# Patient Record
Sex: Female | Born: 1950
Health system: Southern US, Community
[De-identification: ages and names within clinical notes are randomized; demographics above are authoritative.]

## PROBLEM LIST (undated history)

## (undated) DIAGNOSIS — F32A Depression, unspecified: Secondary | ICD-10-CM

## (undated) DIAGNOSIS — M1990 Unspecified osteoarthritis, unspecified site: Secondary | ICD-10-CM

## (undated) DIAGNOSIS — E785 Hyperlipidemia, unspecified: Secondary | ICD-10-CM

## (undated) DIAGNOSIS — I10 Essential (primary) hypertension: Secondary | ICD-10-CM

## (undated) DIAGNOSIS — E559 Vitamin D deficiency, unspecified: Secondary | ICD-10-CM

## (undated) DIAGNOSIS — H269 Unspecified cataract: Secondary | ICD-10-CM

## (undated) DIAGNOSIS — F419 Anxiety disorder, unspecified: Secondary | ICD-10-CM

## (undated) DIAGNOSIS — E039 Hypothyroidism, unspecified: Secondary | ICD-10-CM

## (undated) DIAGNOSIS — M8580 Other specified disorders of bone density and structure, unspecified site: Secondary | ICD-10-CM

## (undated) DIAGNOSIS — N390 Urinary tract infection, site not specified: Secondary | ICD-10-CM

## (undated) DIAGNOSIS — F329 Major depressive disorder, single episode, unspecified: Secondary | ICD-10-CM

## (undated) HISTORY — DX: Hypothyroidism, unspecified: E03.9

## (undated) HISTORY — DX: Vitamin D deficiency, unspecified: E55.9

## (undated) HISTORY — DX: Unspecified cataract: H26.9

## (undated) HISTORY — DX: Essential (primary) hypertension: I10

## (undated) HISTORY — DX: Depression, unspecified: F32.A

## (undated) HISTORY — PX: JOINT REPLACEMENT: SHX530

## (undated) HISTORY — DX: Major depressive disorder, single episode, unspecified: F32.9

## (undated) HISTORY — PX: OTHER SURGICAL HISTORY: SHX169

## (undated) HISTORY — PX: EYE SURGERY: SHX253

## (undated) HISTORY — DX: Hyperlipidemia, unspecified: E78.5

## (undated) HISTORY — DX: Anxiety disorder, unspecified: F41.9

## (undated) HISTORY — PX: COLONOSCOPY: SHX174

---

## 1996-08-02 HISTORY — PX: OTHER SURGICAL HISTORY: SHX169

## 1998-10-02 ENCOUNTER — Other Ambulatory Visit: Admission: RE | Admit: 1998-10-02 | Discharge: 1998-10-02 | Payer: Self-pay | Admitting: *Deleted

## 2000-03-21 ENCOUNTER — Other Ambulatory Visit: Admission: RE | Admit: 2000-03-21 | Discharge: 2000-03-21 | Payer: Self-pay | Admitting: *Deleted

## 2002-05-10 ENCOUNTER — Other Ambulatory Visit: Admission: RE | Admit: 2002-05-10 | Discharge: 2002-05-10 | Payer: Self-pay | Admitting: *Deleted

## 2003-05-14 ENCOUNTER — Other Ambulatory Visit: Admission: RE | Admit: 2003-05-14 | Discharge: 2003-05-14 | Payer: Self-pay | Admitting: *Deleted

## 2004-05-28 ENCOUNTER — Other Ambulatory Visit: Admission: RE | Admit: 2004-05-28 | Discharge: 2004-05-28 | Payer: Self-pay | Admitting: *Deleted

## 2006-06-06 ENCOUNTER — Other Ambulatory Visit: Admission: RE | Admit: 2006-06-06 | Discharge: 2006-06-06 | Payer: Self-pay | Admitting: Internal Medicine

## 2008-05-30 ENCOUNTER — Other Ambulatory Visit: Admission: RE | Admit: 2008-05-30 | Discharge: 2008-05-30 | Payer: Self-pay | Admitting: Internal Medicine

## 2010-03-05 ENCOUNTER — Other Ambulatory Visit: Admission: RE | Admit: 2010-03-05 | Discharge: 2010-03-05 | Payer: Self-pay | Admitting: Internal Medicine

## 2011-10-17 ENCOUNTER — Other Ambulatory Visit: Payer: Self-pay | Admitting: Orthopedic Surgery

## 2011-12-15 DIAGNOSIS — N390 Urinary tract infection, site not specified: Secondary | ICD-10-CM

## 2011-12-15 HISTORY — DX: Urinary tract infection, site not specified: N39.0

## 2011-12-30 ENCOUNTER — Encounter (HOSPITAL_COMMUNITY): Payer: Self-pay

## 2011-12-30 ENCOUNTER — Encounter (HOSPITAL_COMMUNITY)
Admission: RE | Admit: 2011-12-30 | Discharge: 2011-12-30 | Disposition: A | Discharge status: Home / Self Care | Payer: BC Managed Care – PPO | Source: Ambulatory Visit | Attending: Orthopedic Surgery | Admitting: Orthopedic Surgery

## 2011-12-30 HISTORY — DX: Other specified disorders of bone density and structure, unspecified site: M85.80

## 2011-12-30 HISTORY — DX: Urinary tract infection, site not specified: N39.0

## 2011-12-30 HISTORY — DX: Unspecified osteoarthritis, unspecified site: M19.90

## 2011-12-30 LAB — COMPREHENSIVE METABOLIC PANEL
Alkaline Phosphatase: 84 U/L (ref 39–117)
BUN: 16 mg/dL (ref 6–23)
CO2: 28 mEq/L (ref 19–32)
GFR calc Af Amer: >90 mL/min (ref >90)
GFR calc non Af Amer: 90 mL/min — ABNORMAL LOW (ref >90)
Glucose, Bld: 103 mg/dL — ABNORMAL HIGH (ref 70–99)
Potassium: 4.1 mEq/L (ref 3.5–5.1)
Total Bilirubin: 0.4 mg/dL (ref 0.3–1.2)
Total Protein: 7.4 g/dL (ref 6.0–8.3)

## 2011-12-30 LAB — URINE MICROSCOPIC-ADD ON

## 2011-12-30 LAB — PROTIME-INR: Prothrombin Time: 13.2 seconds (ref 11.6–15.2)

## 2011-12-30 LAB — CBC
HCT: 40.8 % (ref 36.0–46.0)
Hemoglobin: 13.6 g/dL (ref 12.0–15.0)
MCHC: 33.3 g/dL (ref 30.0–36.0)

## 2011-12-30 LAB — APTT: aPTT: 30 seconds (ref 24–37)

## 2011-12-30 LAB — URINALYSIS, ROUTINE W REFLEX MICROSCOPIC
Bilirubin Urine: NEGATIVE
Hgb urine dipstick: NEGATIVE
Ketones, ur: NEGATIVE mg/dL
Protein, ur: NEGATIVE mg/dL
Urobilinogen, UA: 0.2 mg/dL (ref 0.0–1.0)

## 2011-12-30 NOTE — Pre-Procedure Instructions (Addendum)
ekg dr Oneta Rack 11-25-2011 on chart Medical clearance note dr Madilyn Fireman on chart

## 2011-12-30 NOTE — Patient Instructions (Addendum)
20 DESTENI PISCOPO  12/30/2011   Your procedure is scheduled on:  01-03-2012  Report to [facility] at  1000 AM.  Call this number if you have problems the morning of surgery: [phone]   Remember:   Do not eat food or drink liquids:After Midnight.  .  Take these medicines the morning of surgery with A SIP OF WATER: levothroxine, cymbalta   Do not wear jewelry or make up.  Do not wear lotions, powders, or perfumes.Do not wear deodorant.    Do not bring valuables to the hospital.  Contacts, dentures or bridgework may not be worn into surgery.  Leave suitcase in the car. After surgery it may be brought to your room.  For patients admitted to the hospital, checkout time is 11:00 AM the day of discharge.     Special Instructions: CHG Shower Use Special Wash: 1/2 bottle night before surgery and 1/2 bottle morning of surgery.neck down avoid private area, no shaving for 2 days before showers   Please read over the following fact sheets that you were given: MRSA Information, blood fact sheet, incentive spirometer fact sheet  [facility] pre op nurse phone number [phone], call if needed

## 2012-01-02 ENCOUNTER — Other Ambulatory Visit: Payer: Self-pay | Admitting: Orthopedic Surgery

## 2012-01-02 NOTE — H&P (Signed)
Michelle Miranda  DOB: 10/20/1950 Married / Language: English / Race: White Female  Date of Admission:  01/03/2012  Chief Complaint:  Right Knee Pain  History of Present Illness The patient is a 61 year old female who comes in for a preoperative History and Physical. The patient is scheduled for a right total knee arthroplasty to be performed by Dr. Frank V. Aluisio, MD at [facility] on 01/03/2012. The patient is a 60 year old female who presents with knee complaints. The patient is seen today Seen by Dr Beane in the past and refered here.. The patient reports left knee and right knee symptoms including: pain which began 2 year(s) (ore more) ago. The patient describes the severity of the symptoms as moderate in severity. Past treatment for this problem has included intra-articular injection of corticosteroids and nonsteroidal anti-inflammatory drugs. and include knee pain, tenderness, swelling, difficulty bearing weight and difficulty ambulating. Michelle Miranda has been seen by Dr. Beane in the past for her bilateral knee pain. She states that the knees started hurting in 2008. She has had cortisone injections, viscosupplementation, and Celebrex without relief. She has pain only with weightbearing. No previous knee surgeries. No locking or catching. She states that at present both knees hurt equally. She has been doing PT which does improve her knee pain for a couple days. She presents today to discuss total knee replacement. She is at a stage now where the knees are bothering her at all times. Limiting what she can and can not do. She generally still can sleep well. She said that she would like to be more active but can not do so because of the knees. She has had injections of cortisone and visco supplements without much benefit. She is ready to proceed with knee replacement.    Problem List/Past Medical Osteoarthritis, Knee (715.96) Impaired Vision Pneumonia. 1979 Urinary Tract  Infection. History Menopause Measles Mumps  Allergies No Known Drug Allergies.   Family History Hypertension. mother Osteoarthritis. mother and father Cancer. mother and grandmother fathers side Chronic Obstructive Lung Disease. father   Social History Marital status. married Living situation. live with spouse Illicit drug use. no Tobacco use. never smoker Pain Contract. no Number of flights of stairs before winded. 2-3 Current work status. working part time Children. 2 Alcohol use. current drinker; drinks wine; 5-7 per week Exercise. Exercises weekly; does other Drug/Alcohol Rehab (Previously). no Drug/Alcohol Rehab (Currently). no Post-Surgical Plans. Planis to go home.   Medication History Levothyroxine Sodium (25MCG Tablet, Oral) Active. Evista (60MG Tablet, Oral) Active. Cymbalta (30MG Capsule DR Part, Oral) Active. Tylenol (325MG Tablet, Oral) Active. Multi-Vitamin ( Oral) Active. Fish Oil Burp-Less (1000MG Capsule, Oral) Active. Vitamin B Complex ( Oral) Active. Advil (200MG Tablet, Oral) Active.   Past Surgical History Cesarean Delivery. 2 times Laproscopic Ovarian Cyst Removal. Date: 09/1996.   Review of Systems General:Not Present- Chills, Fever, Night Sweats, Fatigue, Weight Gain, Weight Loss and Memory Loss. Skin:Not Present- Hives, Itching, Rash, Eczema and Lesions. HEENT:Not Present- Tinnitus, Headache, Double Vision, Visual Loss, Hearing Loss and Dentures. Respiratory:Not Present- Shortness of breath with exertion, Shortness of breath at rest, Allergies, Coughing up blood and Chronic Cough. Cardiovascular:Not Present- Chest Pain, Racing/skipping heartbeats, Difficulty Breathing Lying Down, Murmur, Swelling and Palpitations. Gastrointestinal:Not Present- Bloody Stool, Heartburn, Abdominal Pain, Vomiting, Nausea, Constipation, Diarrhea, Difficulty Swallowing, Jaundice and Loss of appetitie. Female Genitourinary:Not  Present- Blood in Urine, Urinary frequency, Weak urinary stream, Discharge, Flank Pain, Incontinence, Painful Urination, Urgency, Urinary Retention and Urinating at   Night. Musculoskeletal:Present- Joint Pain. Not Present- Muscle Weakness, Muscle Pain, Joint Swelling, Back Pain, Morning Stiffness and Spasms. Neurological:Not Present- Tremor, Dizziness, Blackout spells, Paralysis, Difficulty with balance and Weakness. Psychiatric:Not Present- Insomnia.   Vitals Weight: 175 lb Height: 61 in Body Surface Area: 1.85 m Body Mass Index: 33.07 kg/m Pulse: 68 (Regular) Resp.: 14 (Unlabored) BP: 152/88 (Sitting, Right Arm, Standard)    Physical Exam The physical exam findings are as follows:   General Mental Status - Alert, cooperative and good historian. General Appearance- pleasant. Not in acute distress. Orientation- Oriented X3. Build & Nutrition- Well nourished and Well developed.   Head and Neck Head- normocephalic, atraumatic . Neck Global Assessment- supple. no bruit auscultated on the right and no bruit auscultated on the left.   Eye Pupil- Bilateral- Regular and Round. Motion- Bilateral- EOMI.   Note: wears contacts or glasses  Chest and Lung Exam Auscultation: Breath sounds:- clear at anterior chest wall and - clear at posterior chest wall. Adventitious sounds:- No Adventitious sounds.   Cardiovascular Auscultation:Rhythm- Regular rate and rhythm. Heart Sounds- S1 WNL and S2 WNL. Murmurs & Other Heart Sounds:Auscultation of the heart reveals - No Murmurs.   Abdomen Palpation/Percussion:Tenderness- Abdomen is non-tender to palpation. Rigidity (guarding)- Abdomen is soft. Auscultation:Auscultation of the abdomen reveals - Bowel sounds normal.   Female Genitourinary Not done, not pertinent to present illness  Musculoskeletal On exam she is alert and oriented in no apparent distress. Both hips have normal range of  motion without discomfort. Her left knee shows no effusion. There is moderate crepitus on range of motion of the knee. Range about 5 to 125 with no instability. Right knee no effusion, range 5 to 125, marked crepitus on range of motion. Tender medial greater than lateral with no instability noted.  RADIOGRAPHS: Radiographs are reviewed, AP and lateral both knees, showing advanced arthritic change medial and patellofemoral compartments of both knees with bone on bone and varus deformities.  Assessment & Plan Osteoarthritis, Knee (715.96) Impression: Right Knee  Note: Patient is for a Right Total Knee Placement by Dr. Aluisio.  Plan is to go home after the surgery.  PCP - Dr. McKeown - Patient has been seen preoperatively by Dr. McKeown and felt to be stable for surgery.  Signed electronically by DREW L Eileen Kangas, PA-C  

## 2012-01-03 ENCOUNTER — Inpatient Hospital Stay (HOSPITAL_COMMUNITY)
Admission: RE | Admit: 2012-01-03 | Discharge: 2012-01-06 | DRG: 209 | Disposition: A | Discharge status: Home Health | Payer: BC Managed Care – PPO | Source: Ambulatory Visit | Attending: Orthopedic Surgery | Admitting: Orthopedic Surgery

## 2012-01-03 ENCOUNTER — Encounter (HOSPITAL_COMMUNITY)
Admission: RE | Disposition: A | Discharge status: Home Health | Payer: Self-pay | Source: Ambulatory Visit | Attending: Orthopedic Surgery

## 2012-01-03 ENCOUNTER — Ambulatory Visit (HOSPITAL_COMMUNITY): Payer: BC Managed Care – PPO | Admitting: Anesthesiology

## 2012-01-03 ENCOUNTER — Encounter (HOSPITAL_COMMUNITY): Payer: Self-pay | Admitting: *Deleted

## 2012-01-03 ENCOUNTER — Encounter (HOSPITAL_COMMUNITY): Payer: Self-pay | Admitting: Anesthesiology

## 2012-01-03 DIAGNOSIS — Z01812 Encounter for preprocedural laboratory examination: Secondary | ICD-10-CM

## 2012-01-03 DIAGNOSIS — Z96659 Presence of unspecified artificial knee joint: Secondary | ICD-10-CM

## 2012-01-03 DIAGNOSIS — M1710 Unilateral primary osteoarthritis, unspecified knee: Principal | ICD-10-CM | POA: Diagnosis present

## 2012-01-03 DIAGNOSIS — M179 Osteoarthritis of knee, unspecified: Secondary | ICD-10-CM | POA: Diagnosis present

## 2012-01-03 DIAGNOSIS — Z87440 Personal history of urinary (tract) infections: Secondary | ICD-10-CM

## 2012-01-03 HISTORY — PX: TOTAL KNEE ARTHROPLASTY: SHX125

## 2012-01-03 LAB — TYPE AND SCREEN
ABO/RH(D): O POS
Antibody Screen: NEGATIVE

## 2012-01-03 SURGERY — ARTHROPLASTY, KNEE, TOTAL
Anesthesia: Spinal | Site: Knee | Laterality: Right | Wound class: Clean

## 2012-01-03 MED ORDER — METHOCARBAMOL 100 MG/ML IJ SOLN
500.0000 mg | Freq: Four times a day (QID) | INTRAVENOUS | Status: DC | PRN
  Filled 2012-01-03: qty 5

## 2012-01-03 MED ORDER — MORPHINE SULFATE (PF) 1 MG/ML IV SOLN
INTRAVENOUS | Status: DC
  Administered 2012-01-03: 15:00:00 via INTRAVENOUS
  Administered 2012-01-03: 9 mg via INTRAVENOUS
  Administered 2012-01-04: 8 mg via INTRAVENOUS
  Administered 2012-01-04: 04:00:00 via INTRAVENOUS
  Administered 2012-01-04: 8 mg via INTRAVENOUS
  Administered 2012-01-04: 9 mg via INTRAVENOUS

## 2012-01-03 MED ORDER — OXYCODONE HCL 5 MG PO TABS
5.0000 mg | ORAL_TABLET | ORAL | Status: DC | PRN
  Administered 2012-01-03 – 2012-01-06 (×14): 10 mg via ORAL
  Filled 2012-01-03 (×15): qty 2

## 2012-01-03 MED ORDER — SODIUM CHLORIDE 0.9 % IR SOLN
Status: DC | PRN
  Administered 2012-01-03: 1000 mL

## 2012-01-03 MED ORDER — DIPHENHYDRAMINE HCL 12.5 MG/5ML PO ELIX
12.5000 mg | ORAL_SOLUTION | Freq: Four times a day (QID) | ORAL | Status: DC | PRN
  Filled 2012-01-03: qty 5

## 2012-01-03 MED ORDER — LACTATED RINGERS IV SOLN: INTRAVENOUS | Status: DC

## 2012-01-03 MED ORDER — METHOCARBAMOL 500 MG PO TABS
500.0000 mg | ORAL_TABLET | Freq: Four times a day (QID) | ORAL | Status: DC | PRN
  Administered 2012-01-03 – 2012-01-06 (×8): 500 mg via ORAL
  Filled 2012-01-03 (×9): qty 1

## 2012-01-03 MED ORDER — LACTATED RINGERS IV SOLN
INTRAVENOUS | Status: DC
  Administered 2012-01-03: 13:00:00 via INTRAVENOUS
  Administered 2012-01-03: 1000 mL via INTRAVENOUS
  Administered 2012-01-03: 13:00:00 via INTRAVENOUS

## 2012-01-03 MED ORDER — FLEET ENEMA 7-19 GM/118ML RE ENEM: 1.0000 | ENEMA | Freq: Once | RECTAL | Status: AC | PRN

## 2012-01-03 MED ORDER — NALOXONE HCL 0.4 MG/ML IJ SOLN: 0.4000 mg | INTRAMUSCULAR | Status: DC | PRN

## 2012-01-03 MED ORDER — DEXAMETHASONE SODIUM PHOSPHATE 10 MG/ML IJ SOLN
INTRAMUSCULAR | Status: DC | PRN
  Administered 2012-01-03: 10 mg via INTRAVENOUS

## 2012-01-03 MED ORDER — HYDROMORPHONE HCL PF 1 MG/ML IJ SOLN: 0.2500 mg | INTRAMUSCULAR | Status: DC | PRN

## 2012-01-03 MED ORDER — TRAMADOL HCL 50 MG PO TABS
50.0000 mg | ORAL_TABLET | Freq: Four times a day (QID) | ORAL | Status: DC | PRN
  Filled 2012-01-03: qty 2

## 2012-01-03 MED ORDER — BISACODYL 10 MG RE SUPP: 10.0000 mg | Freq: Every day | RECTAL | Status: DC | PRN

## 2012-01-03 MED ORDER — CEFAZOLIN SODIUM-DEXTROSE 2-3 GM-% IV SOLR
2.0000 g | INTRAVENOUS | Status: AC
  Administered 2012-01-03: 2 g via INTRAVENOUS

## 2012-01-03 MED ORDER — RIVAROXABAN 10 MG PO TABS
10.0000 mg | ORAL_TABLET | Freq: Every day | ORAL | Status: DC
  Administered 2012-01-04 – 2012-01-06 (×3): 10 mg via ORAL
  Filled 2012-01-03 (×4): qty 1

## 2012-01-03 MED ORDER — PROMETHAZINE HCL 25 MG/ML IJ SOLN: 6.2500 mg | INTRAMUSCULAR | Status: DC | PRN

## 2012-01-03 MED ORDER — SODIUM CHLORIDE 0.9 % IJ SOLN: 9.0000 mL | INTRAMUSCULAR | Status: DC | PRN

## 2012-01-03 MED ORDER — BUPIVACAINE IN DEXTROSE 0.75-8.25 % IT SOLN
INTRATHECAL | Status: DC | PRN
  Administered 2012-01-03: 1.6 mL via INTRATHECAL

## 2012-01-03 MED ORDER — CEFAZOLIN SODIUM 1-5 GM-% IV SOLN
1.0000 g | Freq: Four times a day (QID) | INTRAVENOUS | Status: AC
  Administered 2012-01-03 – 2012-01-04 (×3): 1 g via INTRAVENOUS
  Filled 2012-01-03 (×3): qty 50

## 2012-01-03 MED ORDER — BUPIVACAINE 0.25 % ON-Q PUMP SINGLE CATH 300ML
INJECTION | Status: DC | PRN
  Administered 2012-01-03: 300 mL

## 2012-01-03 MED ORDER — BUPIVACAINE 0.25 % ON-Q PUMP SINGLE CATH 300ML
INJECTION | Status: AC
  Filled 2012-01-03: qty 300

## 2012-01-03 MED ORDER — ONDANSETRON HCL 4 MG/2ML IJ SOLN
4.0000 mg | Freq: Four times a day (QID) | INTRAMUSCULAR | Status: DC | PRN
  Administered 2012-01-05: 4 mg via INTRAVENOUS
  Filled 2012-01-03: qty 2

## 2012-01-03 MED ORDER — SODIUM CHLORIDE 0.9 % IV SOLN: INTRAVENOUS | Status: DC

## 2012-01-03 MED ORDER — ONDANSETRON HCL 4 MG/2ML IJ SOLN: 4.0000 mg | Freq: Four times a day (QID) | INTRAMUSCULAR | Status: DC | PRN

## 2012-01-03 MED ORDER — SODIUM CHLORIDE 0.9 % IV SOLN
INTRAVENOUS | Status: DC
  Administered 2012-01-03: 75 mL/h via INTRAVENOUS

## 2012-01-03 MED ORDER — CEFAZOLIN SODIUM-DEXTROSE 2-3 GM-% IV SOLR
INTRAVENOUS | Status: AC
  Filled 2012-01-03: qty 50

## 2012-01-03 MED ORDER — ACETAMINOPHEN 325 MG PO TABS
650.0000 mg | ORAL_TABLET | Freq: Four times a day (QID) | ORAL | Status: DC | PRN
  Filled 2012-01-03: qty 2

## 2012-01-03 MED ORDER — DIPHENHYDRAMINE HCL 50 MG/ML IJ SOLN: 12.5000 mg | Freq: Four times a day (QID) | INTRAMUSCULAR | Status: DC | PRN

## 2012-01-03 MED ORDER — METOCLOPRAMIDE HCL 10 MG PO TABS: 5.0000 mg | ORAL_TABLET | Freq: Three times a day (TID) | ORAL | Status: DC | PRN

## 2012-01-03 MED ORDER — FENTANYL CITRATE 0.05 MG/ML IJ SOLN
INTRAMUSCULAR | Status: DC | PRN
  Administered 2012-01-03: 100 ug via INTRAVENOUS

## 2012-01-03 MED ORDER — ACETAMINOPHEN 10 MG/ML IV SOLN
1000.0000 mg | Freq: Four times a day (QID) | INTRAVENOUS | Status: AC
  Administered 2012-01-03 – 2012-01-04 (×4): 1000 mg via INTRAVENOUS
  Filled 2012-01-03 (×6): qty 100

## 2012-01-03 MED ORDER — ACETAMINOPHEN 650 MG RE SUPP: 650.0000 mg | Freq: Four times a day (QID) | RECTAL | Status: DC | PRN

## 2012-01-03 MED ORDER — PHENOL 1.4 % MT LIQD: 1.0000 | OROMUCOSAL | Status: DC | PRN

## 2012-01-03 MED ORDER — ACETAMINOPHEN 10 MG/ML IV SOLN
INTRAVENOUS | Status: DC | PRN
  Administered 2012-01-03: 1000 mg via INTRAVENOUS

## 2012-01-03 MED ORDER — DULOXETINE HCL 30 MG PO CPEP
30.0000 mg | ORAL_CAPSULE | Freq: Every day | ORAL | Status: DC
  Administered 2012-01-03 – 2012-01-06 (×4): 30 mg via ORAL
  Filled 2012-01-03 (×4): qty 1

## 2012-01-03 MED ORDER — ACETAMINOPHEN 10 MG/ML IV SOLN
INTRAVENOUS | Status: AC
  Filled 2012-01-03: qty 100

## 2012-01-03 MED ORDER — LEVOTHYROXINE SODIUM 25 MCG PO TABS
25.0000 ug | ORAL_TABLET | Freq: Every day | ORAL | Status: DC
  Administered 2012-01-04 – 2012-01-06 (×3): 25 ug via ORAL
  Filled 2012-01-03 (×4): qty 1

## 2012-01-03 MED ORDER — DIPHENHYDRAMINE HCL 12.5 MG/5ML PO ELIX: 12.5000 mg | ORAL_SOLUTION | ORAL | Status: DC | PRN

## 2012-01-03 MED ORDER — METOCLOPRAMIDE HCL 5 MG/ML IJ SOLN: 5.0000 mg | Freq: Three times a day (TID) | INTRAMUSCULAR | Status: DC | PRN

## 2012-01-03 MED ORDER — CHLORHEXIDINE GLUCONATE 4 % EX LIQD
60.0000 mL | Freq: Once | CUTANEOUS | Status: DC
  Filled 2012-01-03: qty 60

## 2012-01-03 MED ORDER — MEPERIDINE HCL 50 MG/ML IJ SOLN: 6.2500 mg | INTRAMUSCULAR | Status: DC | PRN

## 2012-01-03 MED ORDER — PROPOFOL 10 MG/ML IV EMUL
INTRAVENOUS | Status: DC | PRN
  Administered 2012-01-03: 160 ug/kg/min via INTRAVENOUS

## 2012-01-03 MED ORDER — DOCUSATE SODIUM 100 MG PO CAPS
100.0000 mg | ORAL_CAPSULE | Freq: Two times a day (BID) | ORAL | Status: DC
  Administered 2012-01-03 – 2012-01-06 (×6): 100 mg via ORAL

## 2012-01-03 MED ORDER — MENTHOL 3 MG MT LOZG: 1.0000 | LOZENGE | OROMUCOSAL | Status: DC | PRN

## 2012-01-03 MED ORDER — MIDAZOLAM HCL 5 MG/5ML IJ SOLN
INTRAMUSCULAR | Status: DC | PRN
  Administered 2012-01-03: 2 mg via INTRAVENOUS

## 2012-01-03 MED ORDER — POLYETHYLENE GLYCOL 3350 17 G PO PACK: 17.0000 g | PACK | Freq: Every day | ORAL | Status: DC | PRN

## 2012-01-03 MED ORDER — MORPHINE SULFATE (PF) 1 MG/ML IV SOLN
INTRAVENOUS | Status: AC
  Filled 2012-01-03: qty 25

## 2012-01-03 MED ORDER — BUPIVACAINE ON-Q PAIN PUMP (FOR ORDER SET NO CHG)
INJECTION | Status: DC
  Filled 2012-01-03: qty 1

## 2012-01-03 MED ORDER — ONDANSETRON HCL 4 MG PO TABS: 4.0000 mg | ORAL_TABLET | Freq: Four times a day (QID) | ORAL | Status: DC | PRN

## 2012-01-03 MED ORDER — ONDANSETRON HCL 4 MG/2ML IJ SOLN
INTRAMUSCULAR | Status: DC | PRN
  Administered 2012-01-03: 4 mg via INTRAVENOUS

## 2012-01-03 MED ORDER — TEMAZEPAM 15 MG PO CAPS: 15.0000 mg | ORAL_CAPSULE | Freq: Every evening | ORAL | Status: DC | PRN

## 2012-01-03 SURGICAL SUPPLY — 55 items
BAG SPEC THK2 15X12 ZIP CLS (MISCELLANEOUS) ×1
BAG ZIPLOCK 12X15 (MISCELLANEOUS) ×2 IMPLANT
BANDAGE ELASTIC 6 VELCRO ST LF (GAUZE/BANDAGES/DRESSINGS) ×2 IMPLANT
BANDAGE ESMARK 6X9 LF (GAUZE/BANDAGES/DRESSINGS) ×1 IMPLANT
BLADE SAG 18X100X1.27 (BLADE) ×2 IMPLANT
BLADE SAW SGTL 11.0X1.19X90.0M (BLADE) ×2 IMPLANT
BNDG CMPR 9X6 STRL LF SNTH (GAUZE/BANDAGES/DRESSINGS) ×1
BNDG ESMARK 6X9 LF (GAUZE/BANDAGES/DRESSINGS) ×2
BOWL SMART MIX CTS (DISPOSABLE) ×2 IMPLANT
CATH KIT ON-Q SILVERSOAK 5 (CATHETERS) ×1 IMPLANT
CATH KIT ON-Q SILVERSOAK 5IN (CATHETERS) ×2 IMPLANT
CEMENT HV SMART SET (Cement) ×4 IMPLANT
CLOTH BEACON ORANGE TIMEOUT ST (SAFETY) ×2 IMPLANT
CLSR STERI-STRIP ANTIMIC 1/2X4 (GAUZE/BANDAGES/DRESSINGS) ×1 IMPLANT
CUFF TOURN SGL QUICK 34 (TOURNIQUET CUFF) ×2
CUFF TRNQT CYL 34X4X40X1 (TOURNIQUET CUFF) ×1 IMPLANT
DRAPE EXTREMITY T 121X128X90 (DRAPE) ×2 IMPLANT
DRAPE POUCH INSTRU U-SHP 10X18 (DRAPES) ×2 IMPLANT
DRAPE U-SHAPE 47X51 STRL (DRAPES) ×2 IMPLANT
DRSG ADAPTIC 3X8 NADH LF (GAUZE/BANDAGES/DRESSINGS) ×2 IMPLANT
DRSG PAD ABDOMINAL 8X10 ST (GAUZE/BANDAGES/DRESSINGS) ×1 IMPLANT
DURAPREP 26ML APPLICATOR (WOUND CARE) ×2 IMPLANT
ELECT REM PT RETURN 9FT ADLT (ELECTROSURGICAL) ×2
ELECTRODE REM PT RTRN 9FT ADLT (ELECTROSURGICAL) ×1 IMPLANT
EVACUATOR 1/8 PVC DRAIN (DRAIN) ×2 IMPLANT
FACESHIELD LNG OPTICON STERILE (SAFETY) ×9 IMPLANT
GLOVE BIO SURGEON STRL SZ7.5 (GLOVE) ×2 IMPLANT
GLOVE BIO SURGEON STRL SZ8 (GLOVE) ×2 IMPLANT
GLOVE BIOGEL PI IND STRL 8 (GLOVE) ×2 IMPLANT
GLOVE BIOGEL PI INDICATOR 8 (GLOVE) ×2
GOWN STRL NON-REIN LRG LVL3 (GOWN DISPOSABLE) ×3 IMPLANT
GOWN STRL REIN XL XLG (GOWN DISPOSABLE) ×3 IMPLANT
HANDPIECE INTERPULSE COAX TIP (DISPOSABLE) ×2
IMMOBILIZER KNEE 20 (SOFTGOODS) ×2
IMMOBILIZER KNEE 20 THIGH 36 (SOFTGOODS) ×1 IMPLANT
KIT BASIN OR (CUSTOM PROCEDURE TRAY) ×2 IMPLANT
MANIFOLD NEPTUNE II (INSTRUMENTS) ×2 IMPLANT
NS IRRIG 1000ML POUR BTL (IV SOLUTION) ×2 IMPLANT
PACK TOTAL JOINT (CUSTOM PROCEDURE TRAY) ×2 IMPLANT
PAD ABD 7.5X8 STRL (GAUZE/BANDAGES/DRESSINGS) ×1 IMPLANT
PADDING CAST COTTON 6X4 STRL (CAST SUPPLIES) ×4 IMPLANT
POSITIONER SURGICAL ARM (MISCELLANEOUS) ×2 IMPLANT
SET HNDPC FAN SPRY TIP SCT (DISPOSABLE) ×1 IMPLANT
SPONGE GAUZE 4X4 12PLY (GAUZE/BANDAGES/DRESSINGS) ×2 IMPLANT
STRIP CLOSURE SKIN 1/2X4 (GAUZE/BANDAGES/DRESSINGS) ×4 IMPLANT
SUCTION FRAZIER 12FR DISP (SUCTIONS) ×2 IMPLANT
SUT MNCRL AB 4-0 PS2 18 (SUTURE) ×2 IMPLANT
SUT PDS AB 1 CT1 27 (SUTURE) ×6 IMPLANT
SUT VIC AB 2-0 CT1 27 (SUTURE) ×6
SUT VIC AB 2-0 CT1 TAPERPNT 27 (SUTURE) ×3 IMPLANT
SUT VLOC 180 0 24IN GS25 (SUTURE) ×2 IMPLANT
TOWEL OR 17X26 10 PK STRL BLUE (TOWEL DISPOSABLE) ×4 IMPLANT
TRAY FOLEY CATH 14FRSI W/METER (CATHETERS) ×2 IMPLANT
WATER STERILE IRR 1500ML POUR (IV SOLUTION) ×2 IMPLANT
WRAP KNEE MAXI GEL POST OP (GAUZE/BANDAGES/DRESSINGS) ×4 IMPLANT

## 2012-01-03 NOTE — Anesthesia Procedure Notes (Signed)
Spinal  Patient location during procedure: OR Staffing Anesthesiologist: Ival Basquez Performed by: anesthesiologist  Preanesthetic Checklist Completed: patient identified, site marked, surgical consent, pre-op evaluation, timeout performed, IV checked, risks and benefits discussed and monitors and equipment checked Spinal Block Patient position: sitting Prep: Betadine Patient monitoring: heart rate, continuous pulse ox and blood pressure Approach: right paramedian Location: L3-4 Injection technique: single-shot Needle Needle type: Spinocan  Needle gauge: 22 G Needle length: 9 cm Additional Notes Expiration date of kit checked and confirmed. Patient tolerated procedure well, without complications.     

## 2012-01-03 NOTE — Interval H&P Note (Signed)
History and Physical Interval Note:  01/03/2012 12:17 PM  Michelle Miranda  has presented today for surgery, with the diagnosis of osteoarthritis right knee  The various methods of treatment have been discussed with the patient and family. After consideration of risks, benefits and other options for treatment, the patient has consented to  Procedure(s) (LRB): TOTAL KNEE ARTHROPLASTY (Right) as a surgical intervention .  The patients' history has been reviewed, patient examined, no change in status, stable for surgery.  I have reviewed the patients' chart and labs.  Questions were answered to the patient's satisfaction.     Loanne Drilling

## 2012-01-03 NOTE — Transfer of Care (Signed)
Immediate Anesthesia Transfer of Care Note  Patient: Michelle Miranda  Procedure(s) Performed: Procedure(s) (LRB): TOTAL KNEE ARTHROPLASTY (Right)  Patient Location: PACU  Anesthesia Type: Spinal  Level of Consciousness: awake, alert , oriented and patient cooperative  Airway & Oxygen Therapy: Patient Spontanous Breathing and Patient connected to face mask oxygen  Post-op Assessment: Report given to PACU RN and Post -op Vital signs reviewed and stable  Post vital signs: Reviewed and stable  Complications: No apparent anesthesia complications

## 2012-01-03 NOTE — Anesthesia Postprocedure Evaluation (Signed)
  Anesthesia Post-op Note  Patient: Michelle Miranda  Procedure(s) Performed: Procedure(s) (LRB): TOTAL KNEE ARTHROPLASTY (Right)  Patient Location: PACU  Anesthesia Type: Spinal  Level of Consciousness: awake and alert   Airway and Oxygen Therapy: Patient Spontanous Breathing  Post-op Pain: mild  Post-op Assessment: Post-op Vital signs reviewed, Patient's Cardiovascular Status Stable, Respiratory Function Stable, Patent Airway and No signs of Nausea or vomiting  Post-op Vital Signs: stable  Complications: No apparent anesthesia complications

## 2012-01-03 NOTE — Op Note (Signed)
Pre-operative diagnosis- Osteoarthritis  Right knee(s)  Post-operative diagnosis- Osteoarthritis Right knee(s)  Procedure-  Right  Total Knee Arthroplasty  Surgeon- Michelle Rankin. Robson Trickey, MD  Assistant- Avel Peace, PA-C   Anesthesia-  Spinal EBL-* No blood loss amount entered *  Drains Hemovac  Tourniquet time-  Total Tourniquet Time Documented: Thigh (Right) - 31 minutes   Complications- None  Condition-PACU - hemodynamically stable.   Brief Clinical Note  Michelle Miranda is a 61 y.o. year old female with end stage OA of her right knee with progressively worsening pain and dysfunction. She has constant pain, with activity and at rest and significant functional deficits with difficulties even with ADLs. She has had extensive non-op management including analgesics, injections of cortisone and viscosupplements, and home exercise program, but remains in significant pain with significant dysfunction.Radiographs show bone on bone arthritis medial and patellofemoral with varus deformity and tibial subluxation. She presents now for right Total Knee Arthroplasty.    Procedure in detail---   The patient is brought into the operating room and positioned supine on the operating table. After successful administration of  Spinal,   a tourniquet is placed high on the  Right thigh(s) and the lower extremity is prepped and draped in the usual sterile fashion. Time out is performed by the operating team and then the  Right lower extremity is wrapped in Esmarch, knee flexed and the tourniquet inflated to 300 mmHg.       A midline incision is made with a ten blade through the subcutaneous tissue to the level of the extensor mechanism. A fresh blade is used to make a medial parapatellar arthrotomy. Soft tissue over the proximal medial tibia is subperiosteally elevated to the joint line with a knife and into the semimembranosus bursa with a Cobb elevator. Soft tissue over the proximal lateral tibia is elevated with  attention being paid to avoiding the patellar tendon on the tibial tubercle. The patella is everted, knee flexed 90 degrees and the ACL and PCL are removed. Findings are bone on bone medial and patellofemoral with large medial osteophytes.        The drill is used to create a starting hole in the distal femur and the canal is thoroughly irrigated with sterile saline to remove the fatty contents. The 5 degree Right  valgus alignment guide is placed into the femoral canal and the distal femoral cutting block is pinned to remove 10 mm off the distal femur. Resection is made with an oscillating saw.      The tibia is subluxed forward and the menisci are removed. The extramedullary alignment guide is placed referencing proximally at the medial aspect of the tibial tubercle and distally along the second metatarsal axis and tibial crest. The block is pinned to remove 2mm off the more deficient medial  side. Resection is made with an oscillating saw. Size 2.5is the most appropriate size for the tibia and the proximal tibia is prepared with the modular drill and keel punch for that size.      The femoral sizing guide is placed and size 2.5 is most appropriate. Rotation is marked off the epicondylar axis and confirmed by creating a rectangular flexion gap at 90 degrees. The size 2.5 cutting block is pinned in this rotation and the anterior, posterior and chamfer cuts are made with the oscillating saw. The intercondylar block is then placed and that cut is made.      Trial size 2.5 tibial component, trial size 2.5 posterior stabilized  femur and a 10  mm posterior stabilized rotating platform insert trial is placed. Full extension is achieved with excellent varus/valgus and anterior/posterior balance throughout full range of motion. The patella is everted and thickness measured to be 22  mm. Free hand resection is taken to 12 mm, a 35 template is placed, lug holes are drilled, trial patella is placed, and it tracks  normally. Osteophytes are removed off the posterior femur with the trial in place. All trials are removed and the cut bone surfaces prepared with pulsatile lavage. Cement is mixed and once ready for implantation, the size 2.5 tibial implant, size  2.5 posterior stabilized femoral component, and the size 35 patella are cemented in place and the patella is held with the clamp. The trial insert is placed and the knee held in full extension. All extruded cement is removed and once the cement is hard the permanent 10 mm posterior stabilized rotating platform insert is placed into the tibial tray.      The wound is copiously irrigated with saline solution and the extensor mechanism closed over a hemovac drain with #1 PDS suture. The tourniquet is released for a total tourniquet time of 31  minutes. Flexion against gravity is 140 degrees and the patella tracks normally. Subcutaneous tissue is closed with 2.0 vicryl and subcuticular with running 4.0 Monocryl. The catheter for the Marcaine pain pump is placed and the pump is initiated. The incision is cleaned and dried and steri-strips and a bulky sterile dressing are applied. The limb is placed into a knee immobilizer and the patient is awakened and transported to recovery in stable condition.      Please note that a surgical assistant was a medical necessity for this procedure in order to perform it in a safe and expeditious manner. Surgical assistant was necessary to retract the ligaments and vital neurovascular structures to prevent injury to them and also necessary for proper positioning of the limb to allow for anatomic placement of the prosthesis.   Michelle Rankin Jonathon Tan, MD    01/03/2012, 1:23 PM

## 2012-01-03 NOTE — Anesthesia Preprocedure Evaluation (Signed)
Anesthesia Evaluation  Patient identified by MRN, date of birth, ID band Patient awake    Reviewed: Allergy & Precautions, H&P , NPO status , Patient's Chart, lab work & pertinent test results  Airway Mallampati: II TM Distance: >3 FB Neck ROM: Full    Dental No notable dental hx.    Pulmonary neg pulmonary ROS,  breath sounds clear to auscultation  Pulmonary exam normal       Cardiovascular negative cardio ROS  Rhythm:Regular Rate:Normal     Neuro/Psych negative neurological ROS  negative psych ROS   GI/Hepatic negative GI ROS, Neg liver ROS,   Endo/Other  negative endocrine ROS  Renal/GU negative Renal ROS  negative genitourinary   Musculoskeletal negative musculoskeletal ROS (+)   Abdominal   Peds negative pediatric ROS (+)  Hematology negative hematology ROS (+)   Anesthesia Other Findings   Reproductive/Obstetrics negative OB ROS                           Anesthesia Physical Anesthesia Plan  ASA: II  Anesthesia Plan: Spinal   Post-op Pain Management:    Induction:   Airway Management Planned:   Additional Equipment:   Intra-op Plan:   Post-operative Plan:   Informed Consent: I have reviewed the patients History and Physical, chart, labs and discussed the procedure including the risks, benefits and alternatives for the proposed anesthesia with the patient or authorized representative who has indicated his/her understanding and acceptance.   Dental advisory given  Plan Discussed with: CRNA  Anesthesia Plan Comments:         Anesthesia Quick Evaluation

## 2012-01-03 NOTE — H&P (View-Only) (Signed)
Michelle Miranda  DOB: 1951-04-23 Married / Language: English / Race: White Female  Date of Admission:  01/03/2012  Chief Complaint:  Right Knee Pain  History of Present Illness The patient is a 61 year old female who comes in for a preoperative History and Physical. The patient is scheduled for a right total knee arthroplasty to be performed by Dr. Gus Rankin. Aluisio, MD at [facility], [facility], [facility] on 01/03/2012. The patient is a 61 year old female who presents with knee complaints. The patient is seen today Seen by Dr Shelle Iron in the past and refered here.. The patient reports left knee and right knee symptoms including: pain which began 2 year(s) (ore more) ago. The patient describes the severity of the symptoms as moderate in severity. Past treatment for this problem has included intra-articular injection of corticosteroids and nonsteroidal anti-inflammatory drugs. and include knee pain, tenderness, swelling, difficulty bearing weight and difficulty ambulating. Michelle Miranda has been seen by Dr. Shelle Iron in the past for her bilateral knee pain. She states that the knees started hurting in 2008. She has had cortisone injections, viscosupplementation, and Celebrex without relief. She has pain only with weightbearing. No previous knee surgeries. No locking or catching. She states that at present both knees hurt equally. She has been doing PT which does improve her knee pain for a couple days. She presents today to discuss total knee replacement. She is at a stage now where the knees are bothering her at all times. Limiting what she can and can not do. She generally still can sleep well. She said that she would like to be more active but can not do so because of the knees. She has had injections of cortisone and visco supplements without much benefit. She is ready to proceed with knee replacement.    Problem List/Past Medical Osteoarthritis, Knee (715.96) Impaired Vision Pneumonia. 1979 Urinary Tract  Infection. History Menopause Measles Mumps  Allergies No Known Drug Allergies.   Family History Hypertension. mother Osteoarthritis. mother and father Cancer. mother and grandmother fathers side Chronic Obstructive Lung Disease. father   Social History Marital status. married Living situation. live with spouse Illicit drug use. no Tobacco use. never smoker Pain Contract. no Number of flights of stairs before winded. 2-3 Current work status. working part time Children. 2 Alcohol use. current drinker; drinks wine; 5-7 per week Exercise. Exercises weekly; does other Drug/Alcohol Rehab (Previously). no Drug/Alcohol Rehab (Currently). no Post-Surgical Plans. Planis to go home.   Medication History Levothyroxine Sodium ( Tablet, Oral) Active. Evista (60MG  Tablet, Oral) Active. Cymbalta (30MG  Capsule DR Part, Oral) Active. Tylenol (325MG  Tablet, Oral) Active. Multi-Vitamin ( Oral) Active. Fish Oil Burp-Less (1000MG  Capsule, Oral) Active. Vitamin B Complex ( Oral) Active. Advil (200MG  Tablet, Oral) Active.   Past Surgical History Cesarean Delivery. 2 times Laproscopic Ovarian Cyst Removal. Date: 09/1996.   Review of Systems General:Not Present- Chills, Fever, Night Sweats, Fatigue, Weight Gain, Weight Loss and Memory Loss. Skin:Not Present- Hives, Itching, Rash, Eczema and Lesions. HEENT:Not Present- Tinnitus, Headache, Double Vision, Visual Loss, Hearing Loss and Dentures. Respiratory:Not Present- Shortness of breath with exertion, Shortness of breath at rest, Allergies, Coughing up blood and Chronic Cough. Cardiovascular:Not Present- Chest Pain, Racing/skipping heartbeats, Difficulty Breathing Lying Down, Murmur, Swelling and Palpitations. Gastrointestinal:Not Present- Bloody Stool, Heartburn, Abdominal Pain, Vomiting, Nausea, Constipation, Diarrhea, Difficulty Swallowing, Jaundice and Loss of appetitie. Female Genitourinary:Not  Present- Blood in Urine, Urinary frequency, Weak urinary stream, Discharge, Flank Pain, Incontinence, Painful Urination, Urgency, Urinary Retention and Urinating at  Night. Musculoskeletal:Present- Joint Pain. Not Present- Muscle Weakness, Muscle Pain, Joint Swelling, Back Pain, Morning Stiffness and Spasms. Neurological:Not Present- Tremor, Dizziness, Blackout spells, Paralysis, Difficulty with balance and Weakness. Psychiatric:Not Present- Insomnia.   Vitals Weight: 175 lb Height: 61 in Body Surface Area: 1.85 m Body Mass Index: 33.07 kg/m Pulse: 68 (Regular) Resp.: 14 (Unlabored) BP: 152/88 (Sitting, Right Arm, Standard)    Physical Exam The physical exam findings are as follows:   General Mental Status - Alert, cooperative and good historian. General Appearance- pleasant. Not in acute distress. Orientation- Oriented X3. Build & Nutrition- Well nourished and Well developed.   Head and Neck Head- normocephalic, atraumatic . Neck Global Assessment- supple. no bruit auscultated on the right and no bruit auscultated on the left.   Eye Pupil- Bilateral- Regular and Round. Motion- Bilateral- EOMI.   Note: wears contacts or glasses  Chest and Lung Exam Auscultation: Breath sounds:- clear at anterior chest wall and - clear at posterior chest wall. Adventitious sounds:- No Adventitious sounds.   Cardiovascular Auscultation:Rhythm- Regular rate and rhythm. Heart Sounds- S1 WNL and S2 WNL. Murmurs & Other Heart Sounds:Auscultation of the heart reveals - No Murmurs.   Abdomen Palpation/Percussion:Tenderness- Abdomen is non-tender to palpation. Rigidity (guarding)- Abdomen is soft. Auscultation:Auscultation of the abdomen reveals - Bowel sounds normal.   Female Genitourinary Not done, not pertinent to present illness  Musculoskeletal On exam she is alert and oriented in no apparent distress. Both hips have normal range of  motion without discomfort. Her left knee shows no effusion. There is moderate crepitus on range of motion of the knee. Range about 5 to 125 with no instability. Right knee no effusion, range 5 to 125, marked crepitus on range of motion. Tender medial greater than lateral with no instability noted.  RADIOGRAPHS: Radiographs are reviewed, AP and lateral both knees, showing advanced arthritic change medial and patellofemoral compartments of both knees with bone on bone and varus deformities.  Assessment & Plan Osteoarthritis, Knee (715.96) Impression: Right Knee  Note: Patient is for a Right Total Knee Placement by Dr. Lequita Halt.  Plan is to go home after the surgery.  PCP - Dr. Oneta Rack - Patient has been seen preoperatively by Dr. Oneta Rack and felt to be stable for surgery.  Signed electronically by Roberts Gaudy, PA-C

## 2012-01-04 LAB — CBC
Platelets: 259 10*3/uL (ref 150–400)
RBC: 3.65 MIL/uL — ABNORMAL LOW (ref 3.87–5.11)
RDW: 12.4 % (ref 11.5–15.5)
WBC: 12.4 10*3/uL — ABNORMAL HIGH (ref 4.0–10.5)

## 2012-01-04 LAB — BASIC METABOLIC PANEL
CO2: 25 mEq/L (ref 19–32)
Chloride: 103 mEq/L (ref 96–112)
GFR calc Af Amer: >90 mL/min (ref >90)
Potassium: 4.3 mEq/L (ref 3.5–5.1)
Sodium: 137 mEq/L (ref 135–145)

## 2012-01-04 MED ORDER — HYDROMORPHONE HCL PF 1 MG/ML IJ SOLN: 0.5000 mg | INTRAMUSCULAR | Status: DC | PRN

## 2012-01-04 NOTE — Progress Notes (Signed)
Subjective: 1 Day Post-Op Procedure(s) (LRB): TOTAL KNEE ARTHROPLASTY (Right) Patient reports pain as mild.   Patient seen in rounds with Dr. Lequita Halt. Patient is well, but has had some minor complaints of nausea/vomiting and some slight itching. DC Morphine. We will start therapy today.  Plan is to go Home after hospital stay.  Objective: Vital signs in last 24 hours: Temp:  [97.6 F (36.4 C)-99.1 F (37.3 C)] 97.9 F (36.6 C) (06/04 0515) Pulse Rate:  [61-86] 66  (06/04 0515) Resp:  [12-18] 12  (06/04 0515) BP: (93-138)/(57-86) 112/74 mmHg (06/04 0515) SpO2:  [97 %-100 %] 99 % (06/04 0515) Weight:  [79.379 kg (175 lb)] 79.379 kg (175 lb) (06/03 1530)  Intake/Output from previous day:  Intake/Output Summary (Last 24 hours) at 01/04/12 0759 Last data filed at 01/04/12 0516  Gross per 24 hour  Intake 5443.75 ml  Output   2905 ml  Net 2538.75 ml    Intake/Output this shift: UOP 900  Labs:  [facility] 01/04/12 0420  HGB 11.3*    Basename 01/04/12 0420  WBC 12.4*  RBC 3.65*  HCT 33.8*  PLT 259    Basename 01/04/12 0420  NA 137  K 4.3  CL 103  CO2 25  BUN 9  CREATININE 0.76  GLUCOSE 118*  CALCIUM 8.5   No results found for this basename: LABPT:2,INR:2 in the last 72 hours  EXAM General - Patient is Alert, Appropriate and Oriented Extremity - Neurovascular intact Sensation intact distally Dorsiflexion/Plantar flexion intact Dressing - dressing C/D/I Motor Function - intact, moving foot and toes well on exam.  Hemovac pulled without difficulty.  Past Medical History  Diagnosis Date  . Osteopenia   . Arthritis   . Urinary tract infection 12-15-2011    Assessment/Plan: 1 Day Post-Op Procedure(s) (LRB): TOTAL KNEE ARTHROPLASTY (Right) Principal Problem:  *OA (osteoarthritis) of knee   Advance diet Up with therapy Continue foley due to strict I&O and urinary output monitoring Discharge home with home health  DVT Prophylaxis -  Xarelto Weight-Bearing as tolerated to right leg Keep foley until tomorrow. No vaccines. D/C PCA Morphine, Change IV push to Dilaudid. D/C O2 and Pulse OX and try on Room Air  Matheu Ploeger 01/04/2012, 7:59 AM

## 2012-01-04 NOTE — Evaluation (Signed)
Occupational Therapy Evaluation Patient Details Name: Michelle Miranda MRN: [id number] DOB: Dec 16, 1950 Today's Date: 01/04/2012 Time: 4098-1191 OT Time Calculation (min): 24 min  OT Assessment / Plan / Recommendation Clinical Impression  This 61 year old female was admitted for R TKA.  She currently needs overall min A for ADLs and will benefit from continued OT in acute to educate on bathroom transfers and AE.    OT Assessment  Patient needs continued OT Services    Follow Up Recommendations  No OT follow up    Barriers to Discharge      Equipment Recommendations  3 in 1 bedside comode;None recommended by PT    Recommendations for Other Services    Frequency  Min 2X/week    Precautions / Restrictions Precautions Precautions: Knee Restrictions Weight Bearing Restrictions: No Other Position/Activity Restrictions: WBAT   Pertinent Vitals/Pain     ADL  Eating/Feeding: Simulated;Independent Where Assessed - Eating/Feeding: Chair Grooming: Simulated;Set up Where Assessed - Grooming: Unsupported sitting Upper Body Bathing: Simulated;Set up Where Assessed - Upper Body Bathing: Unsupported sitting Lower Body Bathing: Simulated;Minimal assistance Where Assessed - Lower Body Bathing: Supported sit to stand Upper Body Dressing: Simulated;Minimal assistance (iv) Where Assessed - Upper Body Dressing: Unsupported sitting Lower Body Dressing: Simulated;Moderate assistance Where Assessed - Lower Body Dressing: Sopported sit to stand Transfers/Ambulation Related to ADLs: wanted to wait for bathroom transfers until this pm or tomorrow ADL Comments: Educated on AE.  Pt practiced with sock aid.      OT Diagnosis: Generalized weakness  OT Problem List: Decreased strength;Decreased activity tolerance;Decreased knowledge of use of DME or AE;Pain OT Treatment Interventions: Self-care/ADL training;DME and/or AE instruction;Patient/family education   OT Goals Acute Rehab OT Goals OT Goal  Formulation: With patient/family Time For Goal Achievement: 01/11/12 Potential to Achieve Goals: Good ADL Goals Pt Will Transfer to Toilet: with supervision;Ambulation;3-in-1 ADL Goal: Toilet Transfer - Progress: Goal set today Pt Will Perform Tub/Shower Transfer: Shower transfer;with supervision;Ambulation (3:1 in stall) ADL Goal: Tub/Shower Transfer - Progress: Goal set today Miscellaneous OT Goals Miscellaneous OT Goal #1: Pt will complete LB ADLs with supervision using AE OT Goal: Miscellaneous Goal #1 - Progress: Goal set today  Visit Information  Last OT Received On: 01/04/12 Assistance Needed: +1    Subjective Data  Subjective: "I'll have someone with me for the first week" Patient Stated Goal: none stated   Prior Functioning  Home Living Lives With: Spouse Available Help at Discharge: Family Type of Home: House Home Access: Stairs to enter Secretary/administrator of Steps: 15 Entrance Stairs-Rails: Right;Left Home Layout: One level Bathroom Shower/Tub: Health visitor: Standard Home Adaptive Equipment: Environmental consultant - rolling Additional Comments: Michelle Miranda is ordering a 3:1 Prior Function Level of Independence: Independent Able to Take Stairs?: Yes Driving: Yes Communication Communication: No difficulties Dominant Hand: Right    Cognition  Overall Cognitive Status: Appears within functional limits for tasks assessed/performed Arousal/Alertness: Awake/alert Orientation Level: Appears intact for tasks assessed Behavior During Session: [facility] for tasks performed    Extremity/Trunk Assessment Right Upper Extremity Assessment RUE ROM/Strength/Tone: Within functional levels Left Upper Extremity Assessment LUE ROM/Strength/Tone: Within functional levels   Mobility  Transfers Sit to Stand: 4: Min assist   Exercise   Balance    End of Session OT - End of Session Activity Tolerance: Patient tolerated treatment well Patient left: in chair;with call  bell/phone within reach;with family/visitor present CPM Right Knee CPM Right Knee: Off   Michelle Miranda 01/04/2012, 11:57 AM Michelle Miranda, OTR/L [phone] 01/04/2012

## 2012-01-04 NOTE — Evaluation (Signed)
Physical Therapy Evaluation Patient Details Name: Michelle Miranda MRN: [id number] DOB: 06/06/1951 Today's Date: 01/04/2012 Time: 4098-1191 PT Time Calculation (min): 38 min  PT Assessment / Plan / Recommendation Clinical Impression  Pt with R TKR presents with decreased R LE strength/ROM and limited functional mobility    PT Assessment  Patient needs continued PT services    Follow Up Recommendations  Home health PT    Barriers to Discharge        lEquipment Recommendations  None recommended by PT    Recommendations for Other Services OT consult   Frequency 7X/week    Precautions / Restrictions Precautions Precautions: Knee Restrictions Weight Bearing Restrictions: No Other Position/Activity Restrictions: WBAT   Pertinent Vitals/Pain 5/10; premedicated, cold pack applied      Mobility  Bed Mobility Bed Mobility: Supine to Sit Supine to Sit: 4: Min assist Details for Bed Mobility Assistance: cues for sequence and min assist with R LE Transfers Transfers: Sit to Stand;Stand to Sit Sit to Stand: 4: Min assist Stand to Sit: 4: Min assist Details for Transfer Assistance: cues for LE management and use of UEs to self assist Ambulation/Gait Ambulation/Gait Assistance: 4: Min assist Ambulation Distance (Feet): 44 Feet Assistive device: Rolling walker Ambulation/Gait Assistance Details: cues for sequence, position from RW and posture Gait Pattern: Step-to pattern    Exercises Total Joint Exercises Ankle Circles/Pumps: Both;10 reps;Supine;AROM Quad Sets: AROM;10 reps;Both;Supine Heel Slides: AAROM;10 reps;Supine;Right Straight Leg Raises: AROM;AAROM;15 reps;Right;Supine   PT Diagnosis: Difficulty walking  PT Problem List: Decreased strength;Decreased range of motion;Decreased activity tolerance;Decreased mobility;Pain;Decreased knowledge of use of DME PT Treatment Interventions: DME instruction;Gait training;Stair training;Functional mobility training;Therapeutic  activities;Therapeutic exercise;Patient/family education   PT Goals Acute Rehab PT Goals PT Goal Formulation: With patient Time For Goal Achievement: 01/10/12 Potential to Achieve Goals: Good Pt will go Supine/Side to Sit: with supervision PT Goal: Supine/Side to Sit - Progress: Goal set today Pt will go Sit to Supine/Side: with supervision PT Goal: Sit to Supine/Side - Progress: Goal set today Pt will go Sit to Stand: with supervision PT Goal: Sit to Stand - Progress: Goal set today Pt will go Stand to Sit: with supervision PT Goal: Stand to Sit - Progress: Goal set today Pt will Ambulate: 51 - 150 feet;with supervision;with rolling walker PT Goal: Ambulate - Progress: Goal set today Pt will Go Up / Down Stairs: Flight;with min assist;with least restrictive assistive device PT Goal: Up/Down Stairs - Progress: Goal set today  Visit Information  Last PT Received On: 01/04/12 Assistance Needed: +1    Subjective Data  Subjective: I'm going to have to come back for the other one soon Patient Stated Goal: Walk more gracefully   Prior Functioning  Home Living Lives With: Spouse Available Help at Discharge: Family Type of Home: House Home Access: Stairs to enter Secretary/administrator of Steps: 15 Entrance Stairs-Rails: Right;Left Home Layout: One level Home Adaptive Equipment: Walker - rolling Prior Function Level of Independence: Independent Able to Take Stairs?: Yes Driving: Yes Communication Communication: No difficulties Dominant Hand: Right    Cognition  Overall Cognitive Status: Appears within functional limits for tasks assessed/performed Arousal/Alertness: Awake/alert Orientation Level: Appears intact for tasks assessed Behavior During Session: [facility] for tasks performed    Extremity/Trunk Assessment Right Upper Extremity Assessment RUE ROM/Strength/Tone: Within functional levels Left Upper Extremity Assessment LUE ROM/Strength/Tone: Within functional  levels Right Lower Extremity Assessment RLE ROM/Strength/Tone: Deficits RLE ROM/Strength/Tone Deficits: AAROM at knee -10 - 35 with 3/5 quad strength Left Lower Extremity  Assessment LLE ROM/Strength/Tone: WFL for tasks assessed   Balance    End of Session PT - End of Session Activity Tolerance: Patient tolerated treatment well Patient left: in chair;with call bell/phone within reach Nurse Communication: Mobility status CPM Right Knee CPM Right Knee: Off   Katurah Karapetian 01/04/2012, 10:34 AM

## 2012-01-04 NOTE — Plan of Care (Signed)
Problem: Consults Goal: Diagnosis- Total Joint Replacement Outcome: Completed/Met Date Met:  01/04/12 Primary Total Knee RIGHT

## 2012-01-04 NOTE — Progress Notes (Signed)
Physical Therapy Treatment Patient Details Name: Michelle Miranda MRN: [id number] DOB: Oct 08, 1950 Today's Date: 01/04/2012 Time: 1478-2956 PT Time Calculation (min): 23 min  PT Assessment / Plan / Recommendation Comments on Treatment Session       Follow Up Recommendations  Home health PT    Barriers to Discharge        Equipment Recommendations  3 in 1 bedside comode;None recommended by PT    Recommendations for Other Services OT consult  Frequency 7X/week   Plan Discharge plan remains appropriate    Precautions / Restrictions Precautions Precautions: Knee Restrictions Weight Bearing Restrictions: No Other Position/Activity Restrictions: WBAT   Pertinent Vitals/Pain     Mobility  Bed Mobility Bed Mobility: Sit to Supine Sit to Supine: 4: Min assist Details for Bed Mobility Assistance: cues for sequence and min assist with R LE Transfers Transfers: Sit to Stand;Stand to Sit Sit to Stand: 4: Min assist Stand to Sit: 4: Min assist Details for Transfer Assistance: cues for LE management and use of UEs to self assist Ambulation/Gait Ambulation/Gait Assistance: 4: Min assist Ambulation Distance (Feet): 67 Feet Assistive device: Rolling walker Ambulation/Gait Assistance Details: cues for posture, sequence and position from RW Gait Pattern: Step-to pattern    Exercises     PT Diagnosis:    PT Problem List:   PT Treatment Interventions:     PT Goals Acute Rehab PT Goals PT Goal Formulation: With patient Time For Goal Achievement: 01/10/12 Potential to Achieve Goals: Good Pt will go Supine/Side to Sit: with supervision PT Goal: Supine/Side to Sit - Progress: Goal set today Pt will go Sit to Supine/Side: with supervision PT Goal: Sit to Supine/Side - Progress: Goal set today Pt will go Sit to Stand: with supervision PT Goal: Sit to Stand - Progress: Progressing toward goal Pt will go Stand to Sit: with supervision PT Goal: Stand to Sit - Progress: Goal set today Pt  will Ambulate: 51 - 150 feet;with supervision;with rolling walker PT Goal: Ambulate - Progress: Progressing toward goal Pt will Go Up / Down Stairs: Flight;with min assist;with least restrictive assistive device PT Goal: Up/Down Stairs - Progress: Goal set today  Visit Information  Last PT Received On: 01/04/12 Assistance Needed: +1    Subjective Data  Subjective: I'm ready Patient Stated Goal: Walk more gracefully   Cognition  Overall Cognitive Status: Appears within functional limits for tasks assessed/performed Arousal/Alertness: Awake/alert Orientation Level: Appears intact for tasks assessed Behavior During Session: [facility] for tasks performed    Balance     End of Session PT - End of Session Activity Tolerance: Patient tolerated treatment well Patient left: in bed;with call bell/phone within reach;with family/visitor present Nurse Communication: Mobility status    Michelle Miranda 01/04/2012, 3:37 PM

## 2012-01-05 ENCOUNTER — Encounter (HOSPITAL_COMMUNITY): Payer: Self-pay | Admitting: Orthopedic Surgery

## 2012-01-05 LAB — CBC
Hemoglobin: 9.9 g/dL — ABNORMAL LOW (ref 12.0–15.0)
MCH: 31.5 pg (ref 26.0–34.0)
RBC: 3.14 MIL/uL — ABNORMAL LOW (ref 3.87–5.11)
WBC: 12.3 10*3/uL — ABNORMAL HIGH (ref 4.0–10.5)

## 2012-01-05 LAB — BASIC METABOLIC PANEL
CO2: 28 mEq/L (ref 19–32)
Calcium: 8.2 mg/dL — ABNORMAL LOW (ref 8.4–10.5)
Chloride: 100 mEq/L (ref 96–112)
Glucose, Bld: 119 mg/dL — ABNORMAL HIGH (ref 70–99)
Sodium: 135 mEq/L (ref 135–145)

## 2012-01-05 NOTE — Progress Notes (Signed)
Physical Therapy Treatment Patient Details Name: Michelle Miranda MRN: [id number] DOB: October 07, 1950 Today's Date: 01/05/2012 Time: 7829-5621 PT Time Calculation (min): 35 min  PT Assessment / Plan / Recommendation Comments on Treatment Session  Husband present for session    Follow Up Recommendations  Home health PT    Barriers to Discharge        Equipment Recommendations  3 in 1 bedside comode    Recommendations for Other Services OT consult  Frequency 7X/week   Plan Discharge plan remains appropriate    Precautions / Restrictions Precautions Precautions: Knee;Fall Restrictions Other Position/Activity Restrictions: WBAT   Pertinent Vitals/Pain 6/10, premedicated    Mobility  Bed Mobility Bed Mobility: Supine to Sit;Sit to Supine Supine to Sit: 4: Min guard Sit to Supine: 4: Min assist Details for Bed Mobility Assistance: cues for sequence and to self assist L LE with UEs Transfers Transfers: Sit to Stand;Stand to Sit Sit to Stand: 4: Min guard;From bed;From chair/3-in-1;With upper extremity assist Stand to Sit: 4: Min guard;To bed;To chair/3-in-1 Details for Transfer Assistance: cues for LE management and use of UEs to self assist Ambulation/Gait Ambulation/Gait Assistance: 4: Min guard Ambulation Distance (Feet): 50 Feet Assistive device: Rolling walker Ambulation/Gait Assistance Details: min cues for position from RW and sequence Gait Pattern: Step-to pattern Stairs: Yes Stairs Assistance: 4: Min assist Stair Management Technique: One rail Right;With crutches;Forwards;Step to pattern Number of Stairs: 4     Exercises     PT Diagnosis:    PT Problem List:   PT Treatment Interventions:     PT Goals Acute Rehab PT Goals PT Goal Formulation: With patient Time For Goal Achievement: 01/10/12 Potential to Achieve Goals: Good Pt will go Supine/Side to Sit: with supervision PT Goal: Supine/Side to Sit - Progress: Progressing toward goal Pt will go Sit to  Supine/Side: with supervision PT Goal: Sit to Supine/Side - Progress: Progressing toward goal Pt will go Sit to Stand: with supervision PT Goal: Sit to Stand - Progress: Progressing toward goal Pt will go Stand to Sit: with supervision PT Goal: Stand to Sit - Progress: Progressing toward goal Pt will Ambulate: 51 - 150 feet;with supervision;with rolling walker PT Goal: Ambulate - Progress: Progressing toward goal Pt will Go Up / Down Stairs: Flight;with min assist;with least restrictive assistive device PT Goal: Up/Down Stairs - Progress: Progressing toward goal  Visit Information  Last PT Received On: 01/05/12 Assistance Needed: +1    Subjective Data  Subjective:  don't think I'm ready to tackla a full flight of stairs to get into my house Patient Stated Goal: Walk more gracefully   Cognition  Overall Cognitive Status: Appears within functional limits for tasks assessed/performed Arousal/Alertness: Awake/alert Orientation Level: Appears intact for tasks assessed Behavior During Session: [facility] for tasks performed    Balance     End of Session PT - End of Session Equipment Utilized During Treatment: Gait belt;Left knee immobilizer Activity Tolerance: Patient tolerated treatment well Patient left: in bed;with call bell/phone within reach;with family/visitor present Nurse Communication: Mobility status    Jonesha Tsuchiya 01/05/2012, 3:51 PM

## 2012-01-05 NOTE — Progress Notes (Signed)
Utilization review completed.  

## 2012-01-05 NOTE — Progress Notes (Signed)
Subjective: 2 Days Post-Op Procedure(s) (LRB): TOTAL KNEE ARTHROPLASTY (Right) Patient reports pain as mild.   Patient seen in rounds with Dr. Lequita Halt. Patient is well, and has had no acute complaints or problems Plan is to go Home after hospital stay.  Objective: Vital signs in last 24 hours: Temp:  [98 F (36.7 C)-100.1 F (37.8 C)] 100.1 F (37.8 C) (06/05 0529) Pulse Rate:  [75-89] 89  (06/05 0529) Resp:  [16] 16  (06/05 0529) BP: (93-100)/(60-63) 93/60 mmHg (06/05 0529) SpO2:  [92 %-98 %] 98 % (06/05 0529)  Intake/Output from previous day:  Intake/Output Summary (Last 24 hours) at 01/05/12 1311 Last data filed at 01/05/12 1228  Gross per 24 hour  Intake    860 ml  Output   1775 ml  Net   -915 ml    Intake/Output this shift: Total I/O In: -  Out: 1050 [Urine:1050]  Labs:  [facility] 01/05/12 0420 01/04/12 0420  HGB 9.9* 11.3*    Basename 01/05/12 0420 01/04/12 0420  WBC 12.3* 12.4*  RBC 3.14* 3.65*  HCT 29.3* 33.8*  PLT 244 259    Basename 01/05/12 0420 01/04/12 0420  NA 135 137  K 3.7 4.3  CL 100 103  CO2 28 25  BUN 11 9  CREATININE 0.81 0.76  GLUCOSE 119* 118*  CALCIUM 8.2* 8.5   No results found for this basename: LABPT:2,INR:2 in the last 72 hours  EXAM General - Patient is Alert, Appropriate and Oriented Extremity - Neurovascular intact Sensation intact distally Dorsiflexion/Plantar flexion intact Dressing/Incision - clean, dry, no drainage, healing Motor Function - intact, moving foot and toes well on exam.   Past Medical History  Diagnosis Date  . Osteopenia   . Arthritis   . Urinary tract infection 12-15-2011    Assessment/Plan: 2 Days Post-Op Procedure(s) (LRB): TOTAL KNEE ARTHROPLASTY (Right) Principal Problem:  *OA (osteoarthritis) of knee   Up with therapy Discharge home with home health Possible home later this evening versus tomorrow.  DVT Prophylaxis - Xarelto Weight-Bearing as tolerated to right leg  Tramell Piechota,  Jadarion Halbig 01/05/2012, 1:11 PM

## 2012-01-05 NOTE — Progress Notes (Signed)
Physical Therapy Treatment Patient Details Name: Michelle Miranda MRN: [id number] DOB: October 24, 1950 Today's Date: 01/05/2012 Time: 4098-1191 PT Time Calculation (min): 42 min  PT Assessment / Plan / Recommendation Comments on Treatment Session  Ltd by nausea - RN aware    Follow Up Recommendations  Home health PT    Barriers to Discharge        Equipment Recommendations  3 in 1 bedside comode;None recommended by PT    Recommendations for Other Services OT consult  Frequency 7X/week   Plan Discharge plan remains appropriate    Precautions / Restrictions Precautions Precautions: Knee Restrictions Weight Bearing Restrictions: No Other Position/Activity Restrictions: WBAT   Pertinent Vitals/Pain     Mobility  Bed Mobility Bed Mobility: Supine to Sit Supine to Sit: 4: Min guard Details for Bed Mobility Assistance: cues for sequence and to self assist L LE with UEs Transfers Transfers: Sit to Stand;Stand to Sit Sit to Stand: 4: Min assist;4: Min guard Stand to Sit: 4: Min guard;4: Min assist Details for Transfer Assistance: cues for LE management and use of UEs to self assist Ambulation/Gait Ambulation/Gait Assistance: 4: Min assist Ambulation Distance (Feet): 44 Feet Assistive device: Rolling walker Ambulation/Gait Assistance Details: cues for posture, stride length and position from RW Gait Pattern: Step-to pattern    Exercises Total Joint Exercises Ankle Circles/Pumps: Both;Supine;AROM;20 reps Quad Sets: AROM;Both;Supine;20 reps Heel Slides: AAROM;Supine;Right;20 reps Straight Leg Raises: AAROM;Right;Supine;20 reps   PT Diagnosis:    PT Problem List:   PT Treatment Interventions:     PT Goals Acute Rehab PT Goals PT Goal Formulation: With patient Time For Goal Achievement: 01/10/12 Potential to Achieve Goals: Good Pt will go Supine/Side to Sit: with supervision PT Goal: Supine/Side to Sit - Progress: Progressing toward goal Pt will go Sit to Supine/Side: with  supervision PT Goal: Sit to Supine/Side - Progress: Progressing toward goal Pt will go Sit to Stand: with supervision PT Goal: Sit to Stand - Progress: Progressing toward goal Pt will go Stand to Sit: with supervision PT Goal: Stand to Sit - Progress: Progressing toward goal Pt will Ambulate: 51 - 150 feet;with supervision;with rolling walker PT Goal: Ambulate - Progress: Progressing toward goal Pt will Go Up / Down Stairs: Flight;with min assist;with least restrictive assistive device  Visit Information  Last PT Received On: 01/05/12 Assistance Needed: +1    Subjective Data  Subjective: I might go home today but I am worried about all those stairs Patient Stated Goal: Walk more gracefully   Cognition  Overall Cognitive Status: Appears within functional limits for tasks assessed/performed Arousal/Alertness: Awake/alert Orientation Level: Appears intact for tasks assessed Behavior During Session: [facility] for tasks performed    Balance     End of Session PT - End of Session Equipment Utilized During Treatment: Gait belt;Left knee immobilizer Activity Tolerance: Other (comment) (ltd by onset of nausea) Patient left: in chair;with call bell/phone within reach Nurse Communication: Mobility status    Michelle Miranda 01/05/2012, 12:32 PM

## 2012-01-05 NOTE — Progress Notes (Signed)
Occupational Therapy Treatment Patient Details Name: Michelle Miranda MRN: [id number] DOB: [birth year] Today's Date: 01/05/2012 Time: 1191-4782 OT Time Calculation (min): 15 min  OT Assessment / Plan / Recommendation Comments on Treatment Session Pt stating she was discouraged that she was not progressing faster in mobility for d/c home today.      Follow Up Recommendations  No OT follow up    Barriers to Discharge       Equipment Recommendations  3 in 1 bedside comode    Recommendations for Other Services    Frequency Min 2X/week   Plan Discharge plan remains appropriate    Precautions / Restrictions Precautions Precautions: Knee;Fall Restrictions Other Position/Activity Restrictions: WBAT   Pertinent Vitals/Pain     ADL  Toilet Transfer: Performed;Minimal Dentist Method: Other (comment) (ambulating) Acupuncturist: Materials engineer and Hygiene: Performed;Minimal assistance Where Assessed - Engineer, mining and Hygiene: Standing Equipment Used: Rolling walker Transfers/Ambulation Related to ADLs: Pt stating she felt worse today than yesterday.  Verbally instructed in shower stall transfers and suggested pt get an inexpensive resin chair to [place] as a shower seat as her husband will not be available to put the 3 in 1 from the toilet to the shower when he returns to work out of town. ADL Comments: Pt voiced knowledge in use of AE.  Will have her husband purchase kit in gift shop.  Instructed pt in multiple uses of 3 in 1. Pt with anxiety about going home today as she said she is not feeling as well as she did yesterday, particularly since she has not practiced stairs.  Assured pt that it was not unusual to stay an extra day for therapy.    OT Diagnosis:    OT Problem List:   OT Treatment Interventions:     OT Goals ADL Goals Pt Will Transfer to Toilet: with supervision;Ambulation;3-in-1 ADL Goal: Toilet  Transfer - Progress: Progressing toward goals Pt Will Perform Tub/Shower Transfer: Shower transfer;with supervision;Ambulation ADL Goal: Tub/Shower Transfer - Progress: Progressing toward goals Miscellaneous OT Goals Miscellaneous OT Goal #1: Pt will complete LB ADLs with supervision using AE OT Goal: Miscellaneous Goal #1 - Progress: Progressing toward goals  Visit Information  Last OT Received On: 01/05/12 Assistance Needed: +1    Subjective Data      Prior Functioning       Cognition  Overall Cognitive Status: Appears within functional limits for tasks assessed/performed Arousal/Alertness: Awake/alert Orientation Level: Appears intact for tasks assessed Behavior During Session: [facility] for tasks performed    Mobility Bed Mobility Bed Mobility: Sit to Supine Sit to Supine: 4: Min assist Transfers Sit to Stand: 4: Min guard;From chair/3-in-1 Stand to Sit: 4: Min guard;To bed;To chair/3-in-1   Exercises    Balance    End of Session OT - End of Session Activity Tolerance: Patient tolerated treatment well Patient left: in bed;with call bell/phone within reach   Evern Bio 01/05/2012, 2:10 PM [phone]

## 2012-01-06 LAB — CBC
HCT: 28.3 % — ABNORMAL LOW (ref 36.0–46.0)
Hemoglobin: 9.4 g/dL — ABNORMAL LOW (ref 12.0–15.0)
MCH: 30.6 pg (ref 26.0–34.0)
MCV: 92.2 fL (ref 78.0–100.0)
RBC: 3.07 MIL/uL — ABNORMAL LOW (ref 3.87–5.11)
WBC: 11.1 10*3/uL — ABNORMAL HIGH (ref 4.0–10.5)

## 2012-01-06 MED ORDER — OXYCODONE HCL 5 MG PO TABS: 5.0000 mg | ORAL_TABLET | ORAL | Status: AC | PRN

## 2012-01-06 MED ORDER — RIVAROXABAN 10 MG PO TABS: 10.0000 mg | ORAL_TABLET | Freq: Every day | ORAL | Status: DC

## 2012-01-06 MED ORDER — POLYSACCHARIDE IRON COMPLEX 150 MG PO CAPS: 150.0000 mg | ORAL_CAPSULE | Freq: Every day | ORAL | Status: DC

## 2012-01-06 MED ORDER — POLYSACCHARIDE IRON COMPLEX 150 MG PO CAPS
150.0000 mg | ORAL_CAPSULE | Freq: Every day | ORAL | Status: DC
  Administered 2012-01-06: 150 mg via ORAL
  Filled 2012-01-06 (×2): qty 1

## 2012-01-06 MED ORDER — METHOCARBAMOL 500 MG PO TABS: 500.0000 mg | ORAL_TABLET | Freq: Four times a day (QID) | ORAL | Status: AC | PRN

## 2012-01-06 NOTE — Care Management Note (Signed)
    Page 1 of 2   01/06/2012     6:23:29 PM   CARE MANAGEMENT NOTE 01/06/2012  Patient:  Michelle Miranda, Michelle Miranda   Account Number:  [id number]  Date Initiated:  01/06/2012  Documentation initiated by:  Colleen Can  Subjective/Objective Assessment:   dx osteoarthritis right knee; total knee replacemnt     Action/Plan:   CM spoke with patient and spouse. Plans are for pt to return to her home in [place], where spouse will be caregiver. She already has rw and crutches. #n1 has already been delivered to Rm. Pt requesting [name] for [facility] services. Set up pta   Anticipated DC Date:  01/06/2012   Anticipated DC Plan:  HOME W HOME HEALTH SERVICES  In-house referral  NA      DC Planning Services  CM consult      [facility] Choice  HOME HEALTH  DURABLE MEDICAL EQUIPMENT   Choice offered to / List presented to:  C-1 Patient   DME arranged  NA      DME agency  NA     HH arranged  HH-2 PT      [facility] agency  [facility]   Status of service:  Completed, signed off Medicare Important Message given?  NO (If response is "NO", the following Medicare IM given date fields will be blank) Date Medicare IM given:   Date Additional Medicare IM given:    Discharge Disposition:  HOME W HOME HEALTH SERVICES  Per UR Regulation:    If discussed at Long Length of Stay Meetings, dates discussed:    Comments:  01/06/2012 Raynelle Bring BSN CCM [phone] List of [facility] agencies placed on shadow chart. [facility] arranged for DME. HH services will start tomorrow 01/06/2012

## 2012-01-06 NOTE — Progress Notes (Signed)
Discharge summary sent to payer through [employer]  

## 2012-01-06 NOTE — Progress Notes (Signed)
Physical Therapy Treatment Patient Details Name: Michelle Miranda MRN: [id number] DOB: 08/01/1951 Today's Date: 01/06/2012 Time: 4098-1191 PT Time Calculation (min): 40 min  PT Assessment / Plan / Recommendation Comments on Treatment Session       Follow Up Recommendations  Home health PT    Barriers to Discharge        Equipment Recommendations  3 in 1 bedside comode    Recommendations for Other Services OT consult  Frequency 7X/week   Plan Discharge plan remains appropriate    Precautions / Restrictions Precautions Precautions: Knee;Fall Required Braces or Orthoses: Knee Immobilizer - Right Restrictions Weight Bearing Restrictions: No Other Position/Activity Restrictions: WBAT   Pertinent Vitals/Pain 5/10; premedicated, cold pack provided    Mobility  Bed Mobility Supine to Sit: 4: Min guard Transfers Transfers: Sit to Stand;Stand to Sit Sit to Stand: 5: Supervision Stand to Sit: 5: Supervision Details for Transfer Assistance: cues for LE management and use of UEs to self assist Ambulation/Gait Ambulation/Gait Assistance: 4: Min guard;5: Supervision Ambulation Distance (Feet): 150 Feet Assistive device: Rolling walker Ambulation/Gait Assistance Details: min cues for posture and positiion from RW Gait Pattern: Step-to pattern Stairs: Yes Stairs Assistance: 4: Min assist Stairs Assistance Details (indicate cue type and reason): cues for sequence and crutch placement Stair Management Technique: One rail Right;With crutches;Forwards;Step to pattern Number of Stairs: 10     Exercises Total Joint Exercises Ankle Circles/Pumps: Both;Supine;AROM;20 reps Quad Sets: AROM;Both;Supine;20 reps Heel Slides: AAROM;Supine;Right;20 reps Straight Leg Raises: AAROM;Right;Supine;20 reps   PT Diagnosis:    PT Problem List:   PT Treatment Interventions:     PT Goals Acute Rehab PT Goals PT Goal Formulation: With patient Time For Goal Achievement: 01/10/12 Potential to Achieve  Goals: Good Pt will go Supine/Side to Sit: with supervision PT Goal: Supine/Side to Sit - Progress: Progressing toward goal Pt will go Sit to Supine/Side: with supervision PT Goal: Sit to Supine/Side - Progress: Progressing toward goal Pt will go Sit to Stand: with supervision PT Goal: Sit to Stand - Progress: Met Pt will go Stand to Sit: with supervision PT Goal: Stand to Sit - Progress: Met Pt will Ambulate: 51 - 150 feet;with supervision;with rolling walker PT Goal: Ambulate - Progress: Met Pt will Go Up / Down Stairs: Flight;with min assist;with least restrictive assistive device PT Goal: Up/Down Stairs - Progress: Met  Visit Information  Last PT Received On: 01/06/12 Assistance Needed: +1    Subjective Data  Subjective: I am feeling much better about going home today Patient Stated Goal: Walk more gracefully   Cognition  Overall Cognitive Status: Appears within functional limits for tasks assessed/performed Arousal/Alertness: Awake/alert Orientation Level: Appears intact for tasks assessed Behavior During Session: [facility] for tasks performed    Balance     End of Session PT - End of Session Equipment Utilized During Treatment: Gait belt;Left knee immobilizer Patient left: in chair;with call bell/phone within reach Nurse Communication: Mobility status    Michelle Miranda 01/06/2012, 12:22 PM

## 2012-01-06 NOTE — Discharge Instructions (Signed)
 Dr. Frank Aluisio Total Joint Specialist [facility] [address] [place], [place] [address] [phone]  TOTAL KNEE REPLACEMENT POSTOPERATIVE DIRECTIONS    Knee Rehabilitation, Guidelines Following Surgery  Results after knee surgery are often greatly improved when you follow the exercise, range of motion and muscle strengthening exercises prescribed by your doctor. Safety measures are also important to protect the knee from further injury. Any time any of these exercises cause you to have increased pain or swelling in your knee joint, decrease the amount until you are comfortable again and slowly increase them. If you have problems or questions, call your caregiver or physical therapist for advice.   HOME CARE INSTRUCTIONS  Remove items at home which could result in a fall. This includes throw rugs or furniture in walking pathways.  Continue medications as instructed at time of discharge. You may have some home medications which will be placed on hold until you complete the course of blood thinner medication.  You may start showering once you are discharged home but do not submerge the incision under water. Just pat the incision dry and apply a dry gauze dressing on daily. Walk with walker as instructed.   Use walker as long as suggested by your caregivers.  Avoid periods of inactivity such as sitting longer than an hour when not asleep. This helps prevent blood clots.  You may put full weight on your legs and walk as much as is comfortable.  You may resume a sexual relationship in one month or when given the OK by your doctor.  You may return to work once you are cleared by your doctor.  Do not drive a car for 6 weeks or until released by you surgeon.   Do not drive while taking narcotics.  Wear the elastic stockings for three weeks following surgery during the day but you may remove then at night. Make sure you keep all of your appointments after your  operation with all of your doctors and caregivers. You should call the office at the above phone number and make an appointment for approximately two weeks after the date of your surgery. Change the dressing daily and reapply a dry dressing each time. Please pick up a stool softener and laxative for home use as long as you are requiring pain medications.  Continue to use ice on the knee for pain and swelling from surgery. You may notice swelling that will progress down to the foot and ankle.  This is normal after surgery.  Elevate the leg when you are not up walking on it.   It is important for you to complete the blood thinner medication as prescribed by your doctor.  Continue to use the breathing machine which will help keep your temperature down.  It is common for your temperature to cycle up and down following surgery, especially at night when you are not up moving around and exerting yourself.  The breathing machine keeps your lungs expanded and your temperature down.  RANGE OF MOTION AND STRENGTHENING EXERCISES  Rehabilitation of the knee is important following a knee injury or an operation. After just a few days of immobilization, the muscles of the thigh which control the knee become weakened and shrink (atrophy). Knee exercises are designed to build up the tone and strength of the thigh muscles and to improve knee motion. Often times heat used for twenty to thirty minutes before working out will loosen up your tissues and help with improving the range   of motion but do not use heat for the first two weeks following surgery. These exercises can be done on a training (exercise) mat, on the floor, on a table or on a bed. Use what ever works the best and is most comfortable for you Knee exercises include:  Leg Lifts - While your knee is still immobilized in a splint or cast, you can do straight leg raises. Lift the leg to 60 degrees, hold for 3 sec, and slowly lower the leg. Repeat 10-20 times 2-3  times daily. Perform this exercise against resistance later as your knee gets better.  Quad and Hamstring Sets - Tighten up the muscle on the front of the thigh (Quad) and hold for 5-10 sec. Repeat this 10-20 times hourly. Hamstring sets are done by pushing the foot backward against an object and holding for 5-10 sec. Repeat as with quad sets.  A rehabilitation program following serious knee injuries can speed recovery and prevent re-injury in the future due to weakened muscles. Contact your doctor or a physical therapist for more information on knee rehabilitation.   SKILLED REHAB INSTRUCTIONS: If the patient is transferred to a skilled rehab facility following release from the hospital, a list of the current medications will be sent to the facility for the patient to continue.  When discharged from the skilled rehab facility, please have the facility set up the patient's Home Health Physical Therapy prior to being released. Also, the skilled facility will be responsible for providing the patient with their medications at time of release from the facility to include their pain medication, the muscle relaxants, and their blood thinner medication. If the patient is still at the rehab facility at time of the two week follow up appointment, the skilled rehab facility will also need to assist the patient in arranging follow up appointment in our office and any transportation needs.  MAKE SURE YOU:  Understand these instructions.  Will watch your condition.  Will get help right away if you are not doing well or get worse.  Document Released: 07/19/2005 Document Revised: 07/08/2011 Document Reviewed: 01/06/2007  ExitCare Patient Information 2012 [employer].   Take Xarelto for two and a half more weeks, then discontinue Xarelto.        

## 2012-01-06 NOTE — Progress Notes (Signed)
Occupational Therapy Treatment and Discharge Patient Details Name: Michelle Miranda MRN: [id number] DOB: 04-10-1951 Today's Date: 01/06/2012 Time: 4098-1191 OT Time Calculation (min): 19 min  OT Assessment / Plan / Recommendation Comments on Treatment Session      Follow Up Recommendations  No OT follow up    Barriers to Discharge       Equipment Recommendations  3 in 1 bedside comode    Recommendations for Other Services    Frequency     Plan      Precautions / Restrictions Precautions Precautions: Knee;Fall Restrictions Weight Bearing Restrictions: No Other Position/Activity Restrictions: WBAT       ADL  Toilet Transfer: Performed;Supervision/safety Toilet Transfer Method:  (ambulate) Acupuncturist: Environmental manager:  (reviewed and demonstrated sequence.  Pt did not feel she needed to practice)    OT Diagnosis:    OT Problem List:   OT Treatment Interventions:     OT Goals ADL Goals Pt Will Transfer to Toilet: with supervision;Ambulation;3-in-1 ADL Goal: Toilet Transfer - Progress: Met Pt Will Perform Tub/Shower Transfer: Shower transfer;with supervision;Ambulation ADL Goal: Tub/Shower Transfer - Progress:  (verbalizes) Miscellaneous OT Goals Miscellaneous OT Goal #1: Pt will complete LB ADLs with supervision using AE OT Goal: Miscellaneous Goal #1 - Progress: Other (comment) (verbalizes)  Visit Information  Last OT Received On: 01/06/12 Assistance Needed: +1    Subjective Data      Prior Functioning       Cognition  Overall Cognitive Status: Appears within functional limits for tasks assessed/performed Behavior During Session: [facility] for tasks performed    Mobility Bed Mobility Supine to Sit: 4: Min guard   Exercises    Balance    End of Session OT - End of Session Activity Tolerance: Patient tolerated treatment well Patient left: in bed;with call bell/phone within reach CPM Right Knee CPM Right Knee: Off    [name] 01/06/2012, 9:14 AM [name], OTR/L [phone] 01/06/2012

## 2012-01-06 NOTE — Progress Notes (Signed)
Subjective: 3 Days Post-Op Procedure(s) (LRB): TOTAL KNEE ARTHROPLASTY (Right) Patient reports pain as mild.   Patient seen in rounds by Dr. Lequita Halt. Patient is well, and has had no acute complaints or problems Patient is ready to go home.  Objective: Vital signs in last 24 hours: Temp:  [98.1 F (36.7 C)-99.1 F (37.3 C)] 99.1 F (37.3 C) (06/06 0531) Pulse Rate:  [72-90] 90  (06/06 0531) Resp:  [16] 16  (06/06 0531) BP: (102-115)/(65-83) 115/83 mmHg (06/06 0531) SpO2:  [96 %-100 %] 96 % (06/06 0531)  Intake/Output from previous day:  Intake/Output Summary (Last 24 hours) at 01/06/12 1009 Last data filed at 01/06/12 0532  Gross per 24 hour  Intake    600 ml  Output   2050 ml  Net  -1450 ml    Intake/Output this shift:    Labs:  Basename 01/06/12 0418 01/05/12 0420 01/04/12 0420  HGB 9.4* 9.9* 11.3*    Basename 01/06/12 0418 01/05/12 0420  WBC 11.1* 12.3*  RBC 3.07* 3.14*  HCT 28.3* 29.3*  PLT 223 244    Basename 01/05/12 0420 01/04/12 0420  NA 135 137  K 3.7 4.3  CL 100 103  CO2 28 25  BUN 11 9  CREATININE 0.81 0.76  GLUCOSE 119* 118*  CALCIUM 8.2* 8.5   No results found for this basename: LABPT:2,INR:2 in the last 72 hours  EXAM: General - Patient is Alert, Appropriate and Oriented Extremity - Neurovascular intact Sensation intact distally Dorsiflexion/Plantar flexion intact Incision - clean, dry, no drainage, healing Motor Function - intact, moving foot and toes well on exam.   Assessment/Plan: 3 Days Post-Op Procedure(s) (LRB): TOTAL KNEE ARTHROPLASTY (Right) Procedure(s) (LRB): TOTAL KNEE ARTHROPLASTY (Right) Past Medical History  Diagnosis Date  . Osteopenia   . Arthritis   . Urinary tract infection 12-15-2011   Principal Problem:  *OA (osteoarthritis) of knee   Discharge home with home health Diet - Regular diet Follow up - in 2 weeks Activity - WBAT Disposition - Home Condition Upon Discharge - Good D/C Meds - See DC  Summary DVT Prophylaxis - Xarelto  Michelle Miranda 01/06/2012, 10:09 AM

## 2012-01-06 NOTE — Progress Notes (Signed)
Patient OOB to reclining chair with husband at side. Discharge instructions and education given with them repeating information back. They state clear understanding, will be discharging home shortly.

## 2012-01-14 NOTE — Discharge Summary (Signed)
Physician Discharge Summary   Patient ID: Michelle Miranda MRN: [id number] DOB/AGE: Feb 24, 1951 61 y.o.  Admit date: 01/03/2012 Discharge date: 01/05/2030  Primary Diagnosis: Osteoarthritis Right Knee   Admission Diagnoses:  Past Medical History  Diagnosis Date  . Osteopenia   . Arthritis   . Urinary tract infection 12-15-2011   Discharge Diagnoses:   Principal Problem:  *OA (osteoarthritis) of knee  Procedure:  Procedure(s) (LRB): TOTAL KNEE ARTHROPLASTY (Right)   Consults: None  HPI: Michelle Miranda is a 61 y.o. year old female with end stage OA of her right knee with progressively worsening pain and dysfunction. She has constant pain, with activity and at rest and significant functional deficits with difficulties even with ADLs. She has had extensive non-op management including analgesics, injections of cortisone and viscosupplements, and home exercise program, but remains in significant pain with significant dysfunction.Radiographs show bone on bone arthritis medial and patellofemoral with varus deformity and tibial subluxation. She presents now for right Total Knee Arthroplasty.  Laboratory Data: Hospital Outpatient Visit on 12/30/2011  Component Date Value Range Status  . aPTT 12/30/2011 30  24 - 37 seconds Final  . WBC 12/30/2011 8.0  4.0 - 10.5 K/uL Final  . RBC 12/30/2011 4.42  3.87 - 5.11 MIL/uL Final  . Hemoglobin 12/30/2011 13.6  12.0 - 15.0 g/dL Final  . HCT 40/98/1191 40.8  36.0 - 46.0 % Final  . MCV 12/30/2011 92.3  78.0 - 100.0 fL Final  . MCH 12/30/2011 30.8  26.0 - 34.0 pg Final  . MCHC 12/30/2011 33.3  30.0 - 36.0 g/dL Final  . RDW 47/82/9562 12.5  11.5 - 15.5 % Final  . Platelets 12/30/2011 331  150 - 400 K/uL Final  . Sodium 12/30/2011 138  135 - 145 mEq/L Final  . Potassium 12/30/2011 4.1  3.5 - 5.1 mEq/L Final  . Chloride 12/30/2011 101  96 - 112 mEq/L Final  . CO2 12/30/2011 28  19 - 32 mEq/L Final  . Glucose, Bld 12/30/2011 103* 70 - 99 mg/dL Final  . BUN  13/03/6577 16  6 - 23 mg/dL Final  . Creatinine, Ser 12/30/2011 0.78  0.50 - 1.10 mg/dL Final  . Calcium 46/96/2952 9.1  8.4 - 10.5 mg/dL Final  . Total Protein 12/30/2011 7.4  6.0 - 8.3 g/dL Final  . Albumin 84/13/2440 3.9  3.5 - 5.2 g/dL Final  . AST 05/29/2535 23  0 - 37 U/L Final  . ALT 12/30/2011 22  0 - 35 U/L Final  . Alkaline Phosphatase 12/30/2011 84  39 - 117 U/L Final  . Total Bilirubin 12/30/2011 0.4  0.3 - 1.2 mg/dL Final  . GFR calc non Af Amer 12/30/2011 90* >90 mL/min Final  . GFR calc Af Amer 12/30/2011 >90  >90 mL/min Final   Comment:                                 The eGFR has been calculated                          using the CKD EPI equation.                          This calculation has not been  validated in all clinical                          situations.                          eGFR's persistently                          <90 mL/min signify                          possible Chronic Kidney Disease.  Marland Kitchen Prothrombin Time 12/30/2011 13.2  11.6 - 15.2 seconds Final  . INR 12/30/2011 0.98  0.00 - 1.49 Final  . Color, Urine 12/30/2011 YELLOW  YELLOW Final  . APPearance 12/30/2011 CLEAR  CLEAR Final  . Specific Gravity, Urine 12/30/2011 1.023  1.005 - 1.030 Final  . pH 12/30/2011 6.5  5.0 - 8.0 Final  . Glucose, UA 12/30/2011 NEGATIVE  NEGATIVE mg/dL Final  . Hgb urine dipstick 12/30/2011 NEGATIVE  NEGATIVE Final  . Bilirubin Urine 12/30/2011 NEGATIVE  NEGATIVE Final  . Ketones, ur 12/30/2011 NEGATIVE  NEGATIVE mg/dL Final  . Protein, ur 16/05/9603 NEGATIVE  NEGATIVE mg/dL Final  . Urobilinogen, UA 12/30/2011 0.2  0.0 - 1.0 mg/dL Final  . Nitrite 54/04/8118 NEGATIVE  NEGATIVE Final  . Leukocytes, UA 12/30/2011 SMALL* NEGATIVE Final  . MRSA, PCR 12/30/2011 NEGATIVE  NEGATIVE Final  . Staphylococcus aureus 12/30/2011 NEGATIVE  NEGATIVE Final   Comment:                                 The Xpert SA Assay (FDA                           approved for NASAL specimens                          only), is one component of                          a comprehensive surveillance                          program.  It is not intended                          to diagnose infection nor to                          guide or monitor treatment.  . Squamous Epithelial / LPF 12/30/2011 RARE  RARE Final  . WBC, UA 12/30/2011 3-6  <3 WBC/hpf Final  . RBC / HPF 12/30/2011 0-2  <3 RBC/hpf Final  . Bacteria, UA 12/30/2011 FEW* RARE Final  . Urine-Other 12/30/2011 MUCOUS PRESENT   Final   No results found for this basename: HGB:5 in the last 72 hours No results found for this basename: WBC:2,RBC:2,HCT:2,PLT:2 in the last 72 hours No results found for this basename: NA:2,K:2,CL:2,CO2:2,BUN:2,CREATININE:2,GLUCOSE:2,CALCIUM:2 in the last 72 hours No results found for this basename: LABPT:2,INR:2 in the last 72 hours  X-Rays:No results found.  EKG:No orders found for this or any previous  visit.   Hospital Course: Patient was admitted to [facility] and taken to the OR and underwent the above state procedure without complications.  Patient tolerated the procedure well and was later transferred to the recovery room and then to the orthopaedic floor for postoperative care.  They were given PO and IV analgesics for pain control following their surgery.  They were given 24 hours of postoperative antibiotics and started on DVT prophylaxis in the form of Xarelto.   PT and OT were ordered for total joint protocol.  Discharge planning consulted to help with postop disposition and equipment needs.  Patient had a toung night on the evening of surgery due to some minor complaints of nausea/vomiting and some slight itching but started to get up OOB with therapy on day one.  PCA Morphine was discontinued and they were weaned over to PO meds.  Hemovac drain was pulled without difficulty.  Continued to work with therapy into day two.  Dressing was changed on day  two and the incision was healing well.  By day three, the patient had progressed with therapy and meeting their goals.  Incision was healing well.  Patient was seen in rounds and was ready to go home.  Discharge Medications: Prior to Admission medications   Medication Sig Start Date End Date Taking? Authorizing Provider  DULoxetine (CYMBALTA) 30 MG capsule Take 30 mg by mouth daily with breakfast.    Historical Provider, MD  iron polysaccharides (NIFEREX) 150 MG capsule Take 1 capsule (150 mg total) by mouth daily. Take for two and a half more weeks, then discontinue. 01/06/12 01/05/13  Nyeshia Mysliwiec, PA  levothyroxine (SYNTHROID, LEVOTHROID) 25 MCG tablet Take 25 mcg by mouth every morning.    Historical Provider, MD  methocarbamol (ROBAXIN) 500 MG tablet Take 1 tablet (500 mg total) by mouth every 6 (six) hours as needed. 01/06/12 01/16/12  Kaoir Loree, PA  oxyCODONE (OXY IR/ROXICODONE) 5 MG immediate release tablet Take 1-2 tablets (5-10 mg total) by mouth every 4 (four) hours as needed for pain. 01/06/12 01/16/12  Emilene Roma Julien Girt, PA  rivaroxaban (XARELTO) 10 MG TABS tablet Take 1 tablet (10 mg total) by mouth daily with breakfast. Take Xarelto for two and a half more weeks, then discontinue Xarelto. 01/06/12   Jansen Sciuto Julien Girt, PA    Diet: Regular diet Activity:WBAT Follow-up:in 2 weeks Disposition - Home Discharged Condition: good   Discharge Orders    Future Orders Please Complete By Expires   Diet general      Call MD / Call 911      Comments:   If you experience chest pain or shortness of breath, CALL 911 and be transported to the hospital emergency room.  If you develope a fever above 101 F, pus (white drainage) or increased drainage or redness at the wound, or calf pain, call your surgeon's office.   Discharge instructions      Comments:   Pick up stool softner and laxative for home. Do not submerge incision under water. May shower. Continue to use ice for pain and  swelling from surgery.  Take Xarelto for two and a half more weeks, then discontinue Xarelto.   Constipation Prevention      Comments:   Drink plenty of fluids.  Prune juice may be helpful.  You may use a stool softener, such as Colace (over the counter) 100 mg twice a day.  Use MiraLax (over the counter) for constipation as needed.   Increase activity slowly as tolerated  Patient may shower      Comments:   You may shower without a dressing once there is no drainage.  Do not wash over the wound.  If drainage remains, do not shower until drainage stops.   Driving restrictions      Comments:   No driving until released by the physician.   Lifting restrictions      Comments:   No lifting until released by the physician.   TED hose      Comments:   Use stockings (TED hose) for 3 weeks on both leg(s).  You may remove them at night for sleeping.   Change dressing      Comments:   Change dressing daily with sterile 4 x 4 inch gauze dressing and apply TED hose. Do not submerge the incision under water.   Do not put a pillow under the knee. Place it under the heel.      Do not sit on low chairs, stoools or toilet seats, as it may be difficult to get up from low surfaces        Medication List  As of 01/14/2012  1:17 PM   STOP taking these medications         CALCIUM CITRATE + D3 250-200 MG-UNIT Tabs      cholecalciferol 1000 UNITS tablet      Fish Oil 1200 MG Caps      ibuprofen 200 MG tablet      multivitamin with minerals tablet      raloxifene 60 MG tablet      VITAMIN B COMPLEX PO         TAKE these medications         DULoxetine 30 MG capsule   Commonly known as: CYMBALTA   Take 30 mg by mouth daily with breakfast.      iron polysaccharides 150 MG capsule   Commonly known as: NIFEREX   Take 1 capsule (150 mg total) by mouth daily. Take for two and a half more weeks, then discontinue.      levothyroxine 25 MCG tablet   Commonly known as: SYNTHROID, LEVOTHROID    Take 25 mcg by mouth every morning.      methocarbamol 500 MG tablet   Commonly known as: ROBAXIN   Take 1 tablet (500 mg total) by mouth every 6 (six) hours as needed.      oxyCODONE 5 MG immediate release tablet   Commonly known as: Oxy IR/ROXICODONE   Take 1-2 tablets (5-10 mg total) by mouth every 4 (four) hours as needed for pain.      rivaroxaban 10 MG Tabs tablet   Commonly known as: XARELTO   Take 1 tablet (10 mg total) by mouth daily with breakfast. Take Xarelto for two and a half more weeks, then discontinue Xarelto.           Follow-up Information    Follow up with Loanne Drilling, MD. Schedule an appointment as soon as possible for a visit in 2 weeks.   Contact information:   [facility] [address], [address] [place] [address] [phone]          Signed: Patrica Duel 01/14/2012, 1:17 PM

## 2012-08-04 ENCOUNTER — Other Ambulatory Visit: Payer: Self-pay | Admitting: Orthopedic Surgery

## 2012-08-04 MED ORDER — BUPIVACAINE LIPOSOME 1.3 % IJ SUSP: 20.0000 mL | Freq: Once | INTRAMUSCULAR | Status: DC

## 2012-08-04 MED ORDER — DEXAMETHASONE SODIUM PHOSPHATE 10 MG/ML IJ SOLN: 10.0000 mg | Freq: Once | INTRAMUSCULAR | Status: DC

## 2012-08-04 NOTE — Progress Notes (Signed)
Preoperative surgical orders have been place into the Epic hospital system for Michelle Miranda on 08/04/2012, 8:05 AM  by Patrica Duel for surgery on 09/11/2012.  Preop Total Knee orders including Experal, IV Tylenol, and IV Decadron as long as there are no contraindications to the above medications. Avel Peace, PA-C

## 2012-08-30 ENCOUNTER — Encounter (HOSPITAL_COMMUNITY): Payer: Self-pay | Admitting: Pharmacy Technician

## 2012-09-05 ENCOUNTER — Encounter (HOSPITAL_COMMUNITY): Payer: Self-pay

## 2012-09-05 ENCOUNTER — Encounter (HOSPITAL_COMMUNITY)
Admission: RE | Admit: 2012-09-05 | Discharge: 2012-09-05 | Disposition: A | Discharge status: Home / Self Care | Payer: BC Managed Care – PPO | Source: Ambulatory Visit | Attending: Orthopedic Surgery | Admitting: Orthopedic Surgery

## 2012-09-05 HISTORY — PX: WISDOM TOOTH EXTRACTION: SHX21

## 2012-09-05 LAB — URINALYSIS, ROUTINE W REFLEX MICROSCOPIC
Bilirubin Urine: NEGATIVE
Hgb urine dipstick: NEGATIVE
Ketones, ur: NEGATIVE mg/dL
Nitrite: NEGATIVE
Specific Gravity, Urine: 1.011 (ref 1.005–1.030)
Urobilinogen, UA: 0.2 mg/dL (ref 0.0–1.0)
pH: 7.5 (ref 5.0–8.0)

## 2012-09-05 LAB — SURGICAL PCR SCREEN
MRSA, PCR: NEGATIVE
Staphylococcus aureus: NEGATIVE

## 2012-09-05 LAB — APTT: aPTT: 30 seconds (ref 24–37)

## 2012-09-05 NOTE — Pre-Procedure Instructions (Signed)
09-05-12 EKG 9'13- report with chart.W.Ethelyne Erich,RN

## 2012-09-05 NOTE — Patient Instructions (Addendum)
20 Michelle Miranda  09/05/2012   Your procedure is scheduled on:  2-10 -2014  Report to [facility] at     0500   AM .  Call this number if you have problems the morning of surgery: [phone]  Or Presurgical Testing [phone](Kort Stettler)       Do not eat food:After Midnight.    Take these medicines the morning of surgery with A SIP OF WATER: Levothyroxine. Cymbalta.   Do not wear jewelry, make-up or nail polish.  Do not wear lotions, powders, or perfumes. You may wear deodorant.  Do not shave 12 hours prior to first CHG shower(legs and under arms).(face and neck okay.)  Do not bring valuables to the hospital.  Contacts, dentures or bridgework,body piercing,  may not be worn into surgery.  Leave suitcase in the car. After surgery it may be brought to your room.  For patients admitted to the hospital, checkout time is 11:00 AM the day of discharge.   Patients discharged the day of surgery will not be allowed to drive home. Must have responsible person with you x 24 hours once discharged.  Name and phone number of your driver: [name], [phone] cell  Special Instructions: CHG Shower Use Special Wash: see special instructions.(avoid face and genitals)   Please read over the following fact sheets that you were given: MRSA Information, Blood Transfusion fact sheet, Incentive Spirometry Instruction.    Failure to follow these instructions may result in Cancellation of your surgery.   Patient signature_______________________________________________________

## 2012-09-10 ENCOUNTER — Other Ambulatory Visit: Payer: Self-pay | Admitting: Orthopedic Surgery

## 2012-09-10 NOTE — H&P (Signed)
Michelle Miranda  DOB: 11/25/1950 Married / Language: English / Race: White Female  Date of Admission:  09/11/2012  Chief Complaint:  Left Knee Pain  History of Present Illness The patient is a 61 year old female who comes in for a preoperative History and Physical. The patient is scheduled for a left total knee arthroplasty to be performed by Dr. Frank V. Aluisio, MD at [facility] on 09/11/2012. The patient is a 61 year old female presenting for a post-operative visit. The patient comes in today 11 weeks out from right total knee arthroplasty. The patient states that she is doing very well at this time. The pain is under excellent control at this time and describe their pain as mild. They are currently on Tylenol (or Advil) for their pain. The patient is currently doing home exercise program. The patient feels that they are progressing well at this time. The patient is extremely pleased with how the right knee is doing. She has known advanced arthritis in the left knee and wanted to wait awhile before she got the other one done. She is now ready to proceed with left knee surgery. They have been treated conservatively in the past for the above stated problem and despite conservative measures, they continue to have progressive pain and severe functional limitations and dysfunction. They have failed non-operative management including home exercise, medications, and injections. It is felt that they would benefit from undergoing total joint replacement. Risks and benefits of the procedure have been discussed with the patient and they elect to proceed with surgery. There are no active contraindications to surgery such as ongoing infection or rapidly progressive neurological disease.    Problem List Osteoarthritis, Knee (715.96) S/P Right total knee arthroplasty (V43.65)   Allergies No Known Drug Allergies   Family History Cancer. mother and grandmother fathers  side Chronic Obstructive Lung Disease. father Osteoarthritis. mother and father Hypertension. mother   Social History Number of flights of stairs before winded. 2-3 Pain Contract. no Children. 2 Current work status. working part time Living situation. live with spouse Marital status. married Tobacco use. never smoker Illicit drug use. no Post-Surgical Plans. Plan is to go home versus SNF. Exercise. Exercises weekly; does other Alcohol use. current drinker; drinks wine; 5-7 per week Drug/Alcohol Rehab (Currently). no Drug/Alcohol Rehab (Previously). no   Medication History Multi-Vitamin ( Oral) Active. Fish Oil Burp-Less (1000MG Capsule, Oral) Active. Vitamin B Complex ( Oral) Active. Advil (200MG Tablet, Oral) Active. Levothyroxine Sodium (25MCG Tablet, Oral) Active. Evista (60MG Tablet, Oral) Active. Cymbalta (30MG Capsule DR Part, Oral) Active. Calcium Citrate + D ( Oral) Specific dose unknown - Active.   Past Surgical History Laproscopic Ovarian Cyst Removal. Date: 09/1996. Cesarean Delivery. 2 times  Medical History Impaired Vision Pneumonia. 1979 Urinary Tract Infection. Once Chronic Pain Depression Osteoarthritis Menopause Measles Mumps   Review of Systems General:Not Present- Chills, Fever, Night Sweats, Fatigue, Weight Gain, Weight Loss and Memory Loss. Skin:Not Present- Hives, Itching, Rash, Eczema and Lesions. HEENT:Not Present- Tinnitus, Headache, Double Vision, Visual Loss, Hearing Loss and Dentures. Respiratory:Not Present- Shortness of breath with exertion, Shortness of breath at rest, Allergies, Coughing up blood and Chronic Cough. Cardiovascular:Not Present- Chest Pain, Racing/skipping heartbeats, Difficulty Breathing Lying Down, Murmur, Swelling and Palpitations. Gastrointestinal:Not Present- Bloody Stool, Heartburn, Abdominal Pain, Vomiting, Nausea, Constipation, Diarrhea, Difficulty Swallowing, Jaundice and Loss  of appetitie. Female Genitourinary:Not Present- Blood in Urine, Urinary frequency, Weak urinary stream, Discharge, Flank Pain, Incontinence, Painful Urination, Urgency, Urinary Retention and Urinating   at Night. Musculoskeletal:Present- Joint Pain. Not Present- Muscle Weakness, Muscle Pain, Joint Swelling, Back Pain, Morning Stiffness and Spasms. Neurological:Not Present- Tremor, Dizziness, Blackout spells, Paralysis, Difficulty with balance and Weakness. Psychiatric:Not Present- Insomnia.   Vitals Weight: 175 lb Height: 61 in Body Surface Area: 1.85 m Body Mass Index: 33.07 kg/m Pulse: 64 (Regular) Resp.: 14 (Unlabored) BP: 132/82 (Sitting, Left Arm, Standard)    Physical Exam The physical exam findings are as follows:  Note: Patient is a 61 year old female with continued knee pain and discomfort.   General Mental Status - Alert, cooperative and good historian. General Appearance- pleasant. Not in acute distress. Orientation- Oriented X3. Build & Nutrition- Well nourished and Well developed.   Head and Neck Head- normocephalic, atraumatic . Neck Global Assessment- supple. no bruit auscultated on the right and no bruit auscultated on the left.   Eye Vision- Wears corrective lenses. Pupil- Bilateral- Regular and Round. Motion- Bilateral- EOMI.   Note: wears contacts or glasses  Chest and Lung Exam Auscultation: Breath sounds:- clear at anterior chest wall and - clear at posterior chest wall. Adventitious sounds:- No Adventitious sounds.   Cardiovascular Auscultation:Rhythm- Regular rate and rhythm. Heart Sounds- S1 WNL and S2 WNL. Murmurs & Other Heart Sounds:Auscultation of the heart reveals - No Murmurs.   Abdomen Palpation/Percussion:Tenderness- Abdomen is non-tender to palpation. Rigidity (guarding)- Abdomen is soft. Auscultation:Auscultation of the abdomen reveals - Bowel sounds normal.   Female  Genitourinary Not done, not pertinent to present illness  Musculoskeletal On exam she is alert and oriented in no apparent distress. Both hips have normal range of motion without discomfort. Her left knee shows no effusion. There is moderate crepitus on range of motion of the knee. Range about 5 to 125 with no instability. RADIOGRAPHS: Radiographs are reviewed, AP and lateral both knees, showing advanced arthritic change medial and patellofemoral compartments of both knees with bone on bone and varus deformities.  Assessment & Plan Osteoarthritis, Knee (715.96) Impression: Left Knee  Note: Plan is for a Left Total Knee Replacement by Dr. Aluisio.  Plan is to go home versus to rehab following the hospital stay.  PCP - [facility]  Drew Talib Headley, PA-C  

## 2012-09-11 ENCOUNTER — Encounter (HOSPITAL_COMMUNITY): Payer: Self-pay | Admitting: *Deleted

## 2012-09-11 ENCOUNTER — Encounter (HOSPITAL_COMMUNITY)
Admission: RE | Disposition: A | Discharge status: Skilled Nursing Facility | Payer: Self-pay | Source: Ambulatory Visit | Attending: Orthopedic Surgery

## 2012-09-11 ENCOUNTER — Ambulatory Visit (HOSPITAL_COMMUNITY): Payer: BC Managed Care – PPO | Admitting: *Deleted

## 2012-09-11 ENCOUNTER — Inpatient Hospital Stay (HOSPITAL_COMMUNITY)
Admission: RE | Admit: 2012-09-11 | Discharge: 2012-09-15 | DRG: 209 | Disposition: A | Discharge status: Skilled Nursing Facility | Payer: BC Managed Care – PPO | Source: Ambulatory Visit | Attending: Orthopedic Surgery | Admitting: Orthopedic Surgery

## 2012-09-11 DIAGNOSIS — Z01812 Encounter for preprocedural laboratory examination: Secondary | ICD-10-CM

## 2012-09-11 DIAGNOSIS — Z96659 Presence of unspecified artificial knee joint: Secondary | ICD-10-CM

## 2012-09-11 DIAGNOSIS — M1710 Unilateral primary osteoarthritis, unspecified knee: Secondary | ICD-10-CM

## 2012-09-11 HISTORY — PX: TOTAL KNEE ARTHROPLASTY: SHX125

## 2012-09-11 SURGERY — ARTHROPLASTY, KNEE, TOTAL
Anesthesia: Spinal | Site: Knee | Laterality: Left | Wound class: Clean

## 2012-09-11 MED ORDER — ACETAMINOPHEN 10 MG/ML IV SOLN: 1000.0000 mg | Freq: Once | INTRAVENOUS | Status: DC

## 2012-09-11 MED ORDER — ACETAMINOPHEN 10 MG/ML IV SOLN
INTRAVENOUS | Status: DC | PRN
  Administered 2012-09-11: 1000 mg via INTRAVENOUS

## 2012-09-11 MED ORDER — PROPOFOL 10 MG/ML IV EMUL
INTRAVENOUS | Status: DC | PRN
  Administered 2012-09-11: 100 ug/kg/min via INTRAVENOUS

## 2012-09-11 MED ORDER — MIDAZOLAM HCL 5 MG/5ML IJ SOLN
INTRAMUSCULAR | Status: DC | PRN
  Administered 2012-09-11 (×2): 1 mg via INTRAVENOUS

## 2012-09-11 MED ORDER — PHENYLEPHRINE HCL 10 MG/ML IJ SOLN
INTRAMUSCULAR | Status: DC | PRN
  Administered 2012-09-11: 120 ug via INTRAVENOUS
  Administered 2012-09-11: 40 ug via INTRAVENOUS
  Administered 2012-09-11: 80 ug via INTRAVENOUS
  Administered 2012-09-11: 120 ug via INTRAVENOUS
  Administered 2012-09-11: 40 ug via INTRAVENOUS

## 2012-09-11 MED ORDER — DEXAMETHASONE SODIUM PHOSPHATE 10 MG/ML IJ SOLN
INTRAMUSCULAR | Status: DC | PRN
  Administered 2012-09-11: 10 mg via INTRAVENOUS

## 2012-09-11 MED ORDER — ACETAMINOPHEN 650 MG RE SUPP: 650.0000 mg | Freq: Four times a day (QID) | RECTAL | Status: DC | PRN

## 2012-09-11 MED ORDER — DEXAMETHASONE 6 MG PO TABS
10.0000 mg | ORAL_TABLET | Freq: Once | ORAL | Status: AC
  Administered 2012-09-12: 10 mg via ORAL
  Filled 2012-09-11: qty 1

## 2012-09-11 MED ORDER — METOCLOPRAMIDE HCL 10 MG PO TABS: 5.0000 mg | ORAL_TABLET | Freq: Three times a day (TID) | ORAL | Status: DC | PRN

## 2012-09-11 MED ORDER — DOCUSATE SODIUM 100 MG PO CAPS
100.0000 mg | ORAL_CAPSULE | Freq: Two times a day (BID) | ORAL | Status: DC
  Administered 2012-09-11 – 2012-09-15 (×8): 100 mg via ORAL

## 2012-09-11 MED ORDER — FLEET ENEMA 7-19 GM/118ML RE ENEM: 1.0000 | ENEMA | Freq: Once | RECTAL | Status: AC | PRN

## 2012-09-11 MED ORDER — SODIUM CHLORIDE 0.9 % IR SOLN
Status: DC | PRN
  Administered 2012-09-11: 1000 mL

## 2012-09-11 MED ORDER — POLYETHYLENE GLYCOL 3350 17 G PO PACK: 17.0000 g | PACK | Freq: Every day | ORAL | Status: DC | PRN

## 2012-09-11 MED ORDER — RIVAROXABAN 10 MG PO TABS
10.0000 mg | ORAL_TABLET | Freq: Every day | ORAL | Status: DC
  Administered 2012-09-12 – 2012-09-15 (×4): 10 mg via ORAL
  Filled 2012-09-11 (×6): qty 1

## 2012-09-11 MED ORDER — LACTATED RINGERS IV SOLN
INTRAVENOUS | Status: DC | PRN
  Administered 2012-09-11 (×3): via INTRAVENOUS

## 2012-09-11 MED ORDER — BISACODYL 10 MG RE SUPP: 10.0000 mg | Freq: Every day | RECTAL | Status: DC | PRN

## 2012-09-11 MED ORDER — POTASSIUM CHLORIDE IN NACL 20-0.9 MEQ/L-% IV SOLN
INTRAVENOUS | Status: DC
Start: 2012-09-11 — End: 2012-09-13
  Administered 2012-09-11: 17:00:00 via INTRAVENOUS
  Filled 2012-09-11 (×5): qty 1000

## 2012-09-11 MED ORDER — SODIUM CHLORIDE 0.9 % IV SOLN: INTRAVENOUS | Status: DC

## 2012-09-11 MED ORDER — BUPIVACAINE IN DEXTROSE 0.75-8.25 % IT SOLN
INTRATHECAL | Status: DC | PRN
  Administered 2012-09-11: 1.7 mL via INTRATHECAL

## 2012-09-11 MED ORDER — DIPHENHYDRAMINE HCL 12.5 MG/5ML PO ELIX: 12.5000 mg | ORAL_SOLUTION | ORAL | Status: DC | PRN

## 2012-09-11 MED ORDER — CEFAZOLIN SODIUM-DEXTROSE 2-3 GM-% IV SOLR
INTRAVENOUS | Status: AC
  Filled 2012-09-11: qty 50

## 2012-09-11 MED ORDER — MENTHOL 3 MG MT LOZG
1.0000 | LOZENGE | OROMUCOSAL | Status: DC | PRN
  Filled 2012-09-11: qty 9

## 2012-09-11 MED ORDER — PROMETHAZINE HCL 25 MG/ML IJ SOLN: 6.2500 mg | INTRAMUSCULAR | Status: DC | PRN

## 2012-09-11 MED ORDER — CEFAZOLIN SODIUM-DEXTROSE 2-3 GM-% IV SOLR
2.0000 g | INTRAVENOUS | Status: AC
  Administered 2012-09-11: 2 g via INTRAVENOUS

## 2012-09-11 MED ORDER — ONDANSETRON HCL 4 MG PO TABS: 4.0000 mg | ORAL_TABLET | Freq: Four times a day (QID) | ORAL | Status: DC | PRN

## 2012-09-11 MED ORDER — HYDROMORPHONE HCL 2 MG PO TABS
2.0000 mg | ORAL_TABLET | ORAL | Status: DC | PRN
  Administered 2012-09-11 (×2): 2 mg via ORAL
  Administered 2012-09-12 (×3): 4 mg via ORAL
  Administered 2012-09-13: 2 mg via ORAL
  Administered 2012-09-13: 4 mg via ORAL
  Administered 2012-09-13: 2 mg via ORAL
  Administered 2012-09-13 – 2012-09-14 (×3): 4 mg via ORAL
  Administered 2012-09-14 – 2012-09-15 (×5): 2 mg via ORAL
  Filled 2012-09-11: qty 2
  Filled 2012-09-11: qty 1
  Filled 2012-09-11: qty 2
  Filled 2012-09-11: qty 1
  Filled 2012-09-11 (×2): qty 2
  Filled 2012-09-11: qty 1
  Filled 2012-09-11 (×3): qty 2
  Filled 2012-09-11 (×2): qty 1
  Filled 2012-09-11: qty 2
  Filled 2012-09-11 (×3): qty 1

## 2012-09-11 MED ORDER — ACETAMINOPHEN 325 MG PO TABS: 650.0000 mg | ORAL_TABLET | Freq: Four times a day (QID) | ORAL | Status: DC | PRN

## 2012-09-11 MED ORDER — 0.9 % SODIUM CHLORIDE (POUR BTL) OPTIME
TOPICAL | Status: DC | PRN
  Administered 2012-09-11: 1000 mL

## 2012-09-11 MED ORDER — CEFAZOLIN SODIUM 1-5 GM-% IV SOLN
1.0000 g | Freq: Four times a day (QID) | INTRAVENOUS | Status: AC
  Administered 2012-09-11 (×2): 1 g via INTRAVENOUS
  Filled 2012-09-11 (×2): qty 50

## 2012-09-11 MED ORDER — METOCLOPRAMIDE HCL 5 MG/ML IJ SOLN
INTRAMUSCULAR | Status: DC | PRN
  Administered 2012-09-11: 10 mg via INTRAVENOUS

## 2012-09-11 MED ORDER — BUPIVACAINE ON-Q PAIN PUMP (FOR ORDER SET NO CHG)
INJECTION | Status: DC
  Filled 2012-09-11: qty 1

## 2012-09-11 MED ORDER — MEPERIDINE HCL 50 MG/ML IJ SOLN: 6.2500 mg | INTRAMUSCULAR | Status: DC | PRN

## 2012-09-11 MED ORDER — CHLORHEXIDINE GLUCONATE 4 % EX LIQD
60.0000 mL | Freq: Once | CUTANEOUS | Status: DC
Start: 2012-09-11 — End: 2012-09-11
  Filled 2012-09-11: qty 60

## 2012-09-11 MED ORDER — ACETAMINOPHEN 10 MG/ML IV SOLN
1000.0000 mg | Freq: Four times a day (QID) | INTRAVENOUS | Status: AC
  Administered 2012-09-11 – 2012-09-12 (×4): 1000 mg via INTRAVENOUS
  Filled 2012-09-11 (×7): qty 100

## 2012-09-11 MED ORDER — DULOXETINE HCL 30 MG PO CPEP
30.0000 mg | ORAL_CAPSULE | Freq: Every day | ORAL | Status: DC
  Administered 2012-09-12 – 2012-09-15 (×4): 30 mg via ORAL
  Filled 2012-09-11 (×4): qty 1

## 2012-09-11 MED ORDER — LEVOTHYROXINE SODIUM 25 MCG PO TABS
25.0000 ug | ORAL_TABLET | Freq: Every day | ORAL | Status: DC
  Administered 2012-09-12 – 2012-09-15 (×4): 25 ug via ORAL
  Filled 2012-09-11 (×5): qty 1

## 2012-09-11 MED ORDER — LACTATED RINGERS IV SOLN: INTRAVENOUS | Status: DC

## 2012-09-11 MED ORDER — DEXAMETHASONE SODIUM PHOSPHATE 10 MG/ML IJ SOLN: 10.0000 mg | Freq: Once | INTRAMUSCULAR | Status: AC

## 2012-09-11 MED ORDER — METHOCARBAMOL 100 MG/ML IJ SOLN
500.0000 mg | Freq: Four times a day (QID) | INTRAVENOUS | Status: DC | PRN
  Filled 2012-09-11 (×2): qty 5

## 2012-09-11 MED ORDER — KETAMINE HCL 10 MG/ML IJ SOLN
INTRAMUSCULAR | Status: DC | PRN
  Administered 2012-09-11 (×8): 5 mg via INTRAVENOUS

## 2012-09-11 MED ORDER — PHENOL 1.4 % MT LIQD: 1.0000 | OROMUCOSAL | Status: DC | PRN

## 2012-09-11 MED ORDER — METHOCARBAMOL 500 MG PO TABS
500.0000 mg | ORAL_TABLET | Freq: Four times a day (QID) | ORAL | Status: DC | PRN
  Administered 2012-09-12 – 2012-09-13 (×3): 500 mg via ORAL
  Filled 2012-09-11 (×3): qty 1

## 2012-09-11 MED ORDER — SODIUM CHLORIDE 0.9 % IJ SOLN
INTRAMUSCULAR | Status: DC | PRN
  Administered 2012-09-11: 50 mL

## 2012-09-11 MED ORDER — MORPHINE SULFATE 2 MG/ML IJ SOLN
1.0000 mg | INTRAMUSCULAR | Status: DC | PRN
  Administered 2012-09-11: 2 mg via INTRAVENOUS
  Filled 2012-09-11: qty 1

## 2012-09-11 MED ORDER — HYDROMORPHONE HCL PF 1 MG/ML IJ SOLN: 0.2500 mg | INTRAMUSCULAR | Status: DC | PRN

## 2012-09-11 MED ORDER — PROPOFOL 10 MG/ML IV BOLUS
INTRAVENOUS | Status: DC | PRN
  Administered 2012-09-11: 60 mg via INTRAVENOUS

## 2012-09-11 MED ORDER — ONDANSETRON HCL 4 MG/2ML IJ SOLN
INTRAMUSCULAR | Status: DC | PRN
  Administered 2012-09-11: 4 mg via INTRAVENOUS

## 2012-09-11 MED ORDER — ONDANSETRON HCL 4 MG/2ML IJ SOLN: 4.0000 mg | Freq: Four times a day (QID) | INTRAMUSCULAR | Status: DC | PRN

## 2012-09-11 MED ORDER — ACETAMINOPHEN 10 MG/ML IV SOLN
INTRAVENOUS | Status: AC
  Filled 2012-09-11: qty 100

## 2012-09-11 MED ORDER — BUPIVACAINE LIPOSOME 1.3 % IJ SUSP
20.0000 mL | Freq: Once | INTRAMUSCULAR | Status: AC
Start: 2012-09-11 — End: 2012-09-11
  Administered 2012-09-11: 20 mL
  Filled 2012-09-11: qty 20

## 2012-09-11 MED ORDER — TRAMADOL HCL 50 MG PO TABS: 50.0000 mg | ORAL_TABLET | Freq: Four times a day (QID) | ORAL | Status: DC | PRN

## 2012-09-11 MED ORDER — METOCLOPRAMIDE HCL 5 MG/ML IJ SOLN: 5.0000 mg | Freq: Three times a day (TID) | INTRAMUSCULAR | Status: DC | PRN

## 2012-09-11 SURGICAL SUPPLY — 53 items
BAG SPEC THK2 15X12 ZIP CLS (MISCELLANEOUS) ×1
BAG ZIPLOCK 12X15 (MISCELLANEOUS) ×2 IMPLANT
BANDAGE ELASTIC 6 VELCRO ST LF (GAUZE/BANDAGES/DRESSINGS) ×2 IMPLANT
BANDAGE ESMARK 6X9 LF (GAUZE/BANDAGES/DRESSINGS) ×1 IMPLANT
BLADE SAG 18X100X1.27 (BLADE) ×2 IMPLANT
BLADE SAW SGTL 11.0X1.19X90.0M (BLADE) ×2 IMPLANT
BNDG CMPR 9X6 STRL LF SNTH (GAUZE/BANDAGES/DRESSINGS) ×1
BNDG ESMARK 6X9 LF (GAUZE/BANDAGES/DRESSINGS) ×2
BOWL SMART MIX CTS (DISPOSABLE) ×2 IMPLANT
CEMENT HV SMART SET (Cement) ×4 IMPLANT
CLOTH BEACON ORANGE TIMEOUT ST (SAFETY) ×2 IMPLANT
CUFF TOURN SGL QUICK 34 (TOURNIQUET CUFF) ×2
CUFF TRNQT CYL 34X4X40X1 (TOURNIQUET CUFF) ×1 IMPLANT
DRAPE EXTREMITY T 121X128X90 (DRAPE) ×2 IMPLANT
DRAPE POUCH INSTRU U-SHP 10X18 (DRAPES) ×2 IMPLANT
DRAPE U-SHAPE 47X51 STRL (DRAPES) ×2 IMPLANT
DRSG ADAPTIC 3X8 NADH LF (GAUZE/BANDAGES/DRESSINGS) ×2 IMPLANT
DRSG PAD ABDOMINAL 8X10 ST (GAUZE/BANDAGES/DRESSINGS) ×1 IMPLANT
DURAPREP 26ML APPLICATOR (WOUND CARE) ×2 IMPLANT
ELECT REM PT RETURN 9FT ADLT (ELECTROSURGICAL) ×2
ELECTRODE REM PT RTRN 9FT ADLT (ELECTROSURGICAL) ×1 IMPLANT
EVACUATOR 1/8 PVC DRAIN (DRAIN) ×2 IMPLANT
FACESHIELD LNG OPTICON STERILE (SAFETY) ×10 IMPLANT
GLOVE BIO SURGEON STRL SZ8 (GLOVE) ×2 IMPLANT
GLOVE BIOGEL PI IND STRL 8 (GLOVE) ×2 IMPLANT
GLOVE BIOGEL PI INDICATOR 8 (GLOVE) ×1
GLOVE SURG SS PI 6.5 STRL IVOR (GLOVE) ×2 IMPLANT
GOWN STRL NON-REIN LRG LVL3 (GOWN DISPOSABLE) ×5 IMPLANT
GOWN STRL REIN XL XLG (GOWN DISPOSABLE) ×2 IMPLANT
HANDPIECE INTERPULSE COAX TIP (DISPOSABLE) ×2
IMMOBILIZER KNEE 20 (SOFTGOODS) ×2
IMMOBILIZER KNEE 20 THIGH 36 (SOFTGOODS) ×1 IMPLANT
KIT BASIN OR (CUSTOM PROCEDURE TRAY) ×2 IMPLANT
MANIFOLD NEPTUNE II (INSTRUMENTS) ×2 IMPLANT
NDL SAFETY ECLIPSE 18X1.5 (NEEDLE) ×1 IMPLANT
NEEDLE HYPO 18GX1.5 SHARP (NEEDLE) ×2
NS IRRIG 1000ML POUR BTL (IV SOLUTION) ×2 IMPLANT
PACK TOTAL JOINT (CUSTOM PROCEDURE TRAY) ×2 IMPLANT
PADDING CAST COTTON 6X4 STRL (CAST SUPPLIES) ×6 IMPLANT
POSITIONER SURGICAL ARM (MISCELLANEOUS) ×2 IMPLANT
SET HNDPC FAN SPRY TIP SCT (DISPOSABLE) ×1 IMPLANT
SPONGE GAUZE 4X4 12PLY (GAUZE/BANDAGES/DRESSINGS) ×2 IMPLANT
STRIP CLOSURE SKIN 1/2X4 (GAUZE/BANDAGES/DRESSINGS) ×4 IMPLANT
SUCTION FRAZIER 12FR DISP (SUCTIONS) ×2 IMPLANT
SUT MNCRL AB 4-0 PS2 18 (SUTURE) ×2 IMPLANT
SUT VIC AB 2-0 CT1 27 (SUTURE) ×6
SUT VIC AB 2-0 CT1 TAPERPNT 27 (SUTURE) ×3 IMPLANT
SUT VLOC 180 0 24IN GS25 (SUTURE) ×2 IMPLANT
SYR 50ML LL SCALE MARK (SYRINGE) ×2 IMPLANT
TOWEL OR 17X26 10 PK STRL BLUE (TOWEL DISPOSABLE) ×4 IMPLANT
TRAY FOLEY CATH 14FRSI W/METER (CATHETERS) ×2 IMPLANT
WATER STERILE IRR 1500ML POUR (IV SOLUTION) ×3 IMPLANT
WRAP KNEE MAXI GEL POST OP (GAUZE/BANDAGES/DRESSINGS) ×3 IMPLANT

## 2012-09-11 NOTE — Anesthesia Preprocedure Evaluation (Signed)
Anesthesia Evaluation  Patient identified by MRN, date of birth, ID band Patient awake    Reviewed: Allergy & Precautions, H&P , NPO status , Patient's Chart, lab work & pertinent test results  Airway Mallampati: II TM Distance: >3 FB Neck ROM: Full    Dental no notable dental hx.    Pulmonary neg pulmonary ROS,  breath sounds clear to auscultation  Pulmonary exam normal       Cardiovascular negative cardio ROS  Rhythm:Regular Rate:Normal     Neuro/Psych negative neurological ROS  negative psych ROS   GI/Hepatic negative GI ROS, Neg liver ROS,   Endo/Other  negative endocrine ROS  Renal/GU negative Renal ROS  negative genitourinary   Musculoskeletal negative musculoskeletal ROS (+)   Abdominal   Peds negative pediatric ROS (+)  Hematology negative hematology ROS (+)   Anesthesia Other Findings   Reproductive/Obstetrics negative OB ROS                           Anesthesia Physical Anesthesia Plan  ASA: II  Anesthesia Plan: Spinal   Post-op Pain Management:    Induction:   Airway Management Planned:   Additional Equipment:   Intra-op Plan:   Post-operative Plan:   Informed Consent: I have reviewed the patients History and Physical, chart, labs and discussed the procedure including the risks, benefits and alternatives for the proposed anesthesia with the patient or authorized representative who has indicated his/her understanding and acceptance.   Dental advisory given  Plan Discussed with: CRNA  Anesthesia Plan Comments:         Anesthesia Quick Evaluation  

## 2012-09-11 NOTE — Evaluation (Signed)
Physical Therapy Evaluation Patient Details Name: Michelle Miranda MRN: [id number] DOB: [birth year] Today's Date: 09/11/2012 Time: 8295-6213 PT Time Calculation (min): 18 min  PT Assessment / Plan / Recommendation Clinical Impression  Pt s/p L TKR.  Pt would benefit from acute PT services in order to improve independence with transfers, ambulation, and stairs by increasing strength and ROM in L LE to prepare for d/c home with spouse.  Pt reports spouse will be traveling after Sunday however feel pt will progress well and d/c home with HHPT.    PT Assessment  Patient needs continued PT services    Follow Up Recommendations  Home health PT    Does the patient have the potential to tolerate intense rehabilitation      Barriers to Discharge        Equipment Recommendations  None recommended by PT    Recommendations for Other Services     Frequency 7X/week    Precautions / Restrictions Precautions Precautions: Knee Required Braces or Orthoses: Knee Immobilizer - Left Knee Immobilizer - Left: Discontinue once straight leg raise with < 10 degree lag Restrictions Other Position/Activity Restrictions: WBAT L LE   Pertinent Vitals/Pain Pt reports receiving morphine earlier to ease pain and willing to attempt mobility today.  Provided ice packs upon sitting in recliner.      Mobility  Bed Mobility Bed Mobility: Supine to Sit Supine to Sit: 5: Supervision;HOB elevated Details for Bed Mobility Assistance: verbal cues for technique, increased time Transfers Transfers: Stand to Sit;Sit to Stand Sit to Stand: 4: Min guard;With upper extremity assist;From bed;From elevated surface Stand to Sit: 4: Min guard;With upper extremity assist;To chair/3-in-1 Details for Transfer Assistance: verbal cues for safe technique, elevated bed Ambulation/Gait Ambulation/Gait Assistance: 4: Min guard Ambulation Distance (Feet): 4 Feet Assistive device: Rolling walker Ambulation/Gait Assistance Details:  pt reported slight dizziness which did not get worse, only able to tolerate limited distance so assisted to recliner, verbal cues for sequence, RW distance, step length Gait Pattern: Step-to pattern;Antalgic    Exercises     PT Diagnosis: Difficulty walking;Acute pain  PT Problem List: Decreased strength;Decreased range of motion;Decreased activity tolerance;Decreased mobility;Pain PT Treatment Interventions: Gait training;DME instruction;Stair training;Patient/family education;Functional mobility training;Therapeutic activities   PT Goals Acute Rehab PT Goals PT Goal Formulation: With patient Time For Goal Achievement: 09/18/12 Potential to Achieve Goals: Good Pt will go Sit to Stand: with modified independence PT Goal: Sit to Stand - Progress: Goal set today Pt will go Stand to Sit: with modified independence PT Goal: Stand to Sit - Progress: Goal set today Pt will Ambulate: 51 - 150 feet;with modified independence;with least restrictive assistive device PT Goal: Ambulate - Progress: Goal set today Pt will Go Up / Down Stairs: Flight;with min assist;with crutches;with rail(s) PT Goal: Up/Down Stairs - Progress: Goal set today Pt will Perform Home Exercise Program: with supervision, verbal cues required/provided PT Goal: Perform Home Exercise Program - Progress: Goal set today  Visit Information  Last PT Received On: 09/11/12 Assistance Needed: +1    Subjective Data  Subjective: I'm ready to try if you think I'm ready.  (OOB)   Prior Functioning  Home Living Lives With: Spouse Type of Home: House Home Access: Stairs to enter Secretary/administrator of Steps: 15 Entrance Stairs-Rails: Left;Right Home Layout: One level Home Adaptive Equipment: Bedside commode/3-in-1;Walker - rolling;Reacher;Crutches Prior Function Level of Independence: Independent Communication Communication: No difficulties    Cognition  Cognition Overall Cognitive Status: Appears within functional  limits for tasks  assessed/performed Arousal/Alertness: Awake/alert Orientation Level: Appears intact for tasks assessed Behavior During Session: [facility] for tasks performed    Extremity/Trunk Assessment Right Lower Extremity Assessment RLE ROM/Strength/Tone: WFL for tasks assessed RLE Sensation: WFL - Light Touch Left Lower Extremity Assessment LLE ROM/Strength/Tone: Deficits LLE ROM/Strength/Tone Deficits: fair quad contraction, unable to perform SLR, maintained KI LLE Sensation: WFL - Light Touch   Balance    End of Session PT - End of Session Activity Tolerance: Patient limited by fatigue;Patient limited by pain Patient left: in chair;with call bell/phone within reach;with nursing in room CPM Left Knee CPM Left Knee: Off at 1400   GP     Tatsuo Musial,KATHrine E 09/11/2012, 2:38 PM  Zenovia Jarred, PT, DPT 09/11/2012 Pager: [phone]

## 2012-09-11 NOTE — Op Note (Signed)
Pre-operative diagnosis- Osteoarthritis  Left knee(s)  Post-operative diagnosis- Osteoarthritis Left knee(s)  Procedure-  Left  Total Knee Arthroplasty  Surgeon- Michelle Miranda. Michelle Vanterpool, MD  Assistant- Dimitri Ped, PA-C   Anesthesia-  Spinal EBL-* No blood loss amount entered *  Drains Hemovac  Tourniquet time-  Total Tourniquet Time Documented: Thigh (Left) - 30 minutes Total: Thigh (Left) - 30 minutes    Complications- None  Condition-PACU - hemodynamically stable.   Brief Clinical Note  Michelle Miranda is a 62 y.o. year old female with end stage OA of her left knee with progressively worsening pain and dysfunction. She has constant pain, with activity and at rest and significant functional deficits with difficulties even with ADLs. She has had extensive non-op management including analgesics, injections of cortisone and viscosupplements, and home exercise program, but remains in significant pain with significant dysfunction. Radiographs show bone on bone arthritis medial and patellofemoral with varus deformity. She presents now for left Total Knee Arthroplasty.    Procedure in detail---   The patient is brought into the operating room and positioned supine on the operating table. After successful administration of  Spinal,   a tourniquet is placed high on the  Left thigh(s) and the lower extremity is prepped and draped in the usual sterile fashion. Time out is performed by the operating team and then the  Left lower extremity is wrapped in Esmarch, knee flexed and the tourniquet inflated to 300 mmHg.       A midline incision is made with a ten blade through the subcutaneous tissue to the level of the extensor mechanism. A fresh blade is used to make a medial parapatellar arthrotomy. Soft tissue over the proximal medial tibia is subperiosteally elevated to the joint line with a knife and into the semimembranosus bursa with a Cobb elevator. Soft tissue over the proximal lateral tibia is  elevated with attention being paid to avoiding the patellar tendon on the tibial tubercle. The patella is everted, knee flexed 90 degrees and the ACL and PCL are removed. Findings are bone on bone medial and patellofemoral with large osteophyte formation.        The drill is used to create a starting hole in the distal femur and the canal is thoroughly irrigated with sterile saline to remove the fatty contents. The 5 degree Left  valgus alignment guide is placed into the femoral canal and the distal femoral cutting block is pinned to remove 10 mm off the distal femur. Resection is made with an oscillating saw.      The tibia is subluxed forward and the menisci are removed. The extramedullary alignment guide is placed referencing proximally at the medial aspect of the tibial tubercle and distally along the second metatarsal axis and tibial crest. The block is pinned to remove 2mm off the more deficient medial  side. Resection is made with an oscillating saw. Size 2.5is the most appropriate size for the tibia and the proximal tibia is prepared with the modular drill and keel punch for that size.      The femoral sizing guide is placed and size 2.5 is most appropriate. Rotation is marked off the epicondylar axis and confirmed by creating a rectangular flexion gap at 90 degrees. The size 2.5 cutting block is pinned in this rotation and the anterior, posterior and chamfer cuts are made with the oscillating saw. The intercondylar block is then placed and that cut is made.      Trial size 2.5 tibial component,  trial size 2.5 posterior stabilized femur and a 12.5  mm posterior stabilized rotating platform insert trial is placed. Full extension is achieved with excellent varus/valgus and anterior/posterior balance throughout full range of motion. The patella is everted and thickness measured to be 22  mm. Free hand resection is taken to 12 mm, a 35 template is placed, lug holes are drilled, trial patella is placed, and  it tracks normally. Osteophytes are removed off the posterior femur with the trial in place. All trials are removed and the cut bone surfaces prepared with pulsatile lavage. Cement is mixed and once ready for implantation, the size 2.5 tibial implant, size  2.5 posterior stabilized femoral component, and the size 35 patella are cemented in place and the patella is held with the clamp. The trial insert is placed and the knee held in full extension. The Exparel (20 ml mixed with 50 ml saline) is injected into the extensor mechanism, posterior capsule, medial and lateral gutters and subcutaneous tissues.  All extruded cement is removed and once the cement is hard the permanent 12.5 mm posterior stabilized rotating platform insert is placed into the tibial tray.      The wound is copiously irrigated with saline solution and the extensor mechanism closed over a hemovac drain with #1 PDS suture. The tourniquet is released for a total tourniquet time of 30  minutes. Flexion against gravity is 140 degrees and the patella tracks normally. Subcutaneous tissue is closed with 2.0 vicryl and subcuticular with running 4.0 Monocryl. The incision is cleaned and dried and steri-strips and a bulky sterile dressing are applied. The limb is placed into a knee immobilizer and the patient is awakened and transported to recovery in stable condition.      Please note that a surgical assistant was a medical necessity for this procedure in order to perform it in a safe and expeditious manner. Surgical assistant was necessary to retract the ligaments and vital neurovascular structures to prevent injury to them and also necessary for proper positioning of the limb to allow for anatomic placement of the prosthesis.   Michelle Miranda Michelle Pucillo, MD    09/11/2012, 8:08 AM

## 2012-09-11 NOTE — Anesthesia Procedure Notes (Signed)
Spinal  Patient location during procedure: OR Staffing Anesthesiologist: Alice Vitelli Performed by: anesthesiologist  Preanesthetic Checklist Completed: patient identified, site marked, surgical consent, pre-op evaluation, timeout performed, IV checked, risks and benefits discussed and monitors and equipment checked Spinal Block Patient position: sitting Prep: Betadine Patient monitoring: heart rate, continuous pulse ox and blood pressure Approach: right paramedian Location: L3-4 Injection technique: single-shot Needle Needle type: Spinocan  Needle gauge: 22 G Needle length: 9 cm Additional Notes Expiration date of kit checked and confirmed. Patient tolerated procedure well, without complications.     

## 2012-09-11 NOTE — Preoperative (Signed)
Beta Blockers   Reason not to administer Beta Blockers:Not Applicable, not on home BB 

## 2012-09-11 NOTE — Transfer of Care (Signed)
Immediate Anesthesia Transfer of Care Note  Patient: Michelle Miranda  Procedure(s) Performed: Procedure(s): Left Total Knee Arthroplasty (Left)  Patient Location: PACU  Anesthesia Type:MAC and Spinal  Level of Consciousness: awake, alert , oriented, patient cooperative and responds to stimulation  Airway & Oxygen Therapy: Patient Spontanous Breathing and Patient connected to face mask oxygen  Post-op Assessment: Report given to PACU RN and Post -op Vital signs reviewed and stable, spinal at T11  Post vital signs: Reviewed and stable  Complications: No apparent anesthesia complications

## 2012-09-11 NOTE — Interval H&P Note (Signed)
History and Physical Interval Note:  09/11/2012 6:56 AM  Michelle Miranda  has presented today for surgery, with the diagnosis of Osteoarthritis of the Left Knee  The various methods of treatment have been discussed with the patient and family. After consideration of risks, benefits and other options for treatment, the patient has consented to  Procedure(s): TOTAL KNEE ARTHROPLASTY (Left) as a surgical intervention .  The patient's history has been reviewed, patient examined, no change in status, stable for surgery.  I have reviewed the patient's chart and labs.  Questions were answered to the patient's satisfaction.     Loanne Drilling

## 2012-09-11 NOTE — Anesthesia Postprocedure Evaluation (Signed)
  Anesthesia Post-op Note  Patient: Michelle Miranda  Procedure(s) Performed: Procedure(s) (LRB): Left Total Knee Arthroplasty (Left)  Patient Location: PACU  Anesthesia Type: Spinal  Level of Consciousness: awake and alert   Airway and Oxygen Therapy: Patient Spontanous Breathing  Post-op Pain: mild  Post-op Assessment: Post-op Vital signs reviewed, Patient's Cardiovascular Status Stable, Respiratory Function Stable, Patent Airway and No signs of Nausea or vomiting  Last Vitals:  Filed Vitals:   09/11/12 0930  BP: 130/80  Pulse: 99  Temp: 36.7 C  Resp: 16    Post-op Vital Signs: stable   Complications: No apparent anesthesia complications

## 2012-09-11 NOTE — H&P (View-Only) (Signed)
Michelle Miranda  DOB: 08-09-1950 Married / Language: English / Race: White Female  Date of Admission:  09/11/2012  Chief Complaint:  Left Knee Pain  History of Present Illness The patient is a 62 year old female who comes in for a preoperative History and Physical. The patient is scheduled for a left total knee arthroplasty to be performed by Dr. Gus Rankin. Aluisio, MD at [facility] on 09/11/2012. The patient is a 62 year old female presenting for a post-operative visit. The patient comes in today 11 weeks out from right total knee arthroplasty. The patient states that she is doing very well at this time. The pain is under excellent control at this time and describe their pain as mild. They are currently on Tylenol (or Advil) for their pain. The patient is currently doing home exercise program. The patient feels that they are progressing well at this time. The patient is extremely pleased with how the right knee is doing. She has known advanced arthritis in the left knee and wanted to wait awhile before she got the other one done. She is now ready to proceed with left knee surgery. They have been treated conservatively in the past for the above stated problem and despite conservative measures, they continue to have progressive pain and severe functional limitations and dysfunction. They have failed non-operative management including home exercise, medications, and injections. It is felt that they would benefit from undergoing total joint replacement. Risks and benefits of the procedure have been discussed with the patient and they elect to proceed with surgery. There are no active contraindications to surgery such as ongoing infection or rapidly progressive neurological disease.    Problem List Osteoarthritis, Knee (715.96) S/P Right total knee arthroplasty (V43.65)   Allergies No Known Drug Allergies   Family History Cancer. mother and grandmother fathers  side Chronic Obstructive Lung Disease. father Osteoarthritis. mother and father Hypertension. mother   Social History Number of flights of stairs before winded. 2-3 Pain Contract. no Children. 2 Current work status. working part time Living situation. live with spouse Marital status. married Tobacco use. never smoker Illicit drug use. no Post-Surgical Plans. Plan is to go home versus SNF. Exercise. Exercises weekly; does other Alcohol use. current drinker; drinks wine; 5-7 per week Drug/Alcohol Rehab (Currently). no Drug/Alcohol Rehab (Previously). no   Medication History Multi-Vitamin ( Oral) Active. Fish Oil Burp-Less (1000MG  Capsule, Oral) Active. Vitamin B Complex ( Oral) Active. Advil (200MG  Tablet, Oral) Active. Levothyroxine Sodium ( Tablet, Oral) Active. Evista (60MG  Tablet, Oral) Active. Cymbalta (30MG  Capsule DR Part, Oral) Active. Calcium Citrate + D ( Oral) Specific dose unknown - Active.   Past Surgical History Laproscopic Ovarian Cyst Removal. Date: 09/1996. Cesarean Delivery. 2 times  Medical History Impaired Vision Pneumonia. 1979 Urinary Tract Infection. Once Chronic Pain Depression Osteoarthritis Menopause Measles Mumps   Review of Systems General:Not Present- Chills, Fever, Night Sweats, Fatigue, Weight Gain, Weight Loss and Memory Loss. Skin:Not Present- Hives, Itching, Rash, Eczema and Lesions. HEENT:Not Present- Tinnitus, Headache, Double Vision, Visual Loss, Hearing Loss and Dentures. Respiratory:Not Present- Shortness of breath with exertion, Shortness of breath at rest, Allergies, Coughing up blood and Chronic Cough. Cardiovascular:Not Present- Chest Pain, Racing/skipping heartbeats, Difficulty Breathing Lying Down, Murmur, Swelling and Palpitations. Gastrointestinal:Not Present- Bloody Stool, Heartburn, Abdominal Pain, Vomiting, Nausea, Constipation, Diarrhea, Difficulty Swallowing, Jaundice and Loss  of appetitie. Female Genitourinary:Not Present- Blood in Urine, Urinary frequency, Weak urinary stream, Discharge, Flank Pain, Incontinence, Painful Urination, Urgency, Urinary Retention and Urinating  at Night. Musculoskeletal:Present- Joint Pain. Not Present- Muscle Weakness, Muscle Pain, Joint Swelling, Back Pain, Morning Stiffness and Spasms. Neurological:Not Present- Tremor, Dizziness, Blackout spells, Paralysis, Difficulty with balance and Weakness. Psychiatric:Not Present- Insomnia.   Vitals Weight: 175 lb Height: 61 in Body Surface Area: 1.85 m Body Mass Index: 33.07 kg/m Pulse: 64 (Regular) Resp.: 14 (Unlabored) BP: 132/82 (Sitting, Left Arm, Standard)    Physical Exam The physical exam findings are as follows:  Note: Patient is a 62 year old female with continued knee pain and discomfort.   General Mental Status - Alert, cooperative and good historian. General Appearance- pleasant. Not in acute distress. Orientation- Oriented X3. Build & Nutrition- Well nourished and Well developed.   Head and Neck Head- normocephalic, atraumatic . Neck Global Assessment- supple. no bruit auscultated on the right and no bruit auscultated on the left.   Eye Vision- Wears corrective lenses. Pupil- Bilateral- Regular and Round. Motion- Bilateral- EOMI.   Note: wears contacts or glasses  Chest and Lung Exam Auscultation: Breath sounds:- clear at anterior chest wall and - clear at posterior chest wall. Adventitious sounds:- No Adventitious sounds.   Cardiovascular Auscultation:Rhythm- Regular rate and rhythm. Heart Sounds- S1 WNL and S2 WNL. Murmurs & Other Heart Sounds:Auscultation of the heart reveals - No Murmurs.   Abdomen Palpation/Percussion:Tenderness- Abdomen is non-tender to palpation. Rigidity (guarding)- Abdomen is soft. Auscultation:Auscultation of the abdomen reveals - Bowel sounds normal.   Female  Genitourinary Not done, not pertinent to present illness  Musculoskeletal On exam she is alert and oriented in no apparent distress. Both hips have normal range of motion without discomfort. Her left knee shows no effusion. There is moderate crepitus on range of motion of the knee. Range about 5 to 125 with no instability. RADIOGRAPHS: Radiographs are reviewed, AP and lateral both knees, showing advanced arthritic change medial and patellofemoral compartments of both knees with bone on bone and varus deformities.  Assessment & Plan Osteoarthritis, Knee (715.96) Impression: Left Knee  Note: Plan is for a Left Total Knee Replacement by Dr. Lequita Halt.  Plan is to go home versus to rehab following the hospital stay.  PCP - [facility]  Avel Peace, PA-C

## 2012-09-11 NOTE — Progress Notes (Signed)
Clinical Social Work Department CLINICAL SOCIAL WORK PLACEMENT NOTE 09/11/2012  Patient:  Michelle Miranda, Michelle Miranda  Account Number:  [id number] Admit date:  09/11/2012  Clinical Social Worker:  Jacelyn Grip  Date/time:  09/11/2012 04:30 PM  Clinical Social Work is seeking post-discharge placement for this patient at the following level of care:   SKILLED NURSING   (*CSW will update this form in Epic as items are completed)   09/11/2012  Patient/family provided with [facility] Department of Clinical Social Work's list of facilities offering this level of care within the geographic area requested by the patient (or if unable, by the patient's family).  09/11/2012  Patient/family informed of their freedom to choose among providers that offer the needed level of care, that participate in Medicare, Medicaid or managed care program needed by the patient, have an available bed and are willing to accept the patient.  09/11/2012  Patient/family informed of [facility]' ownership interest in [facility], as well as of the fact that they are under no obligation to receive care at this facility.  PASARR submitted to EDS on 09/11/2012 PASARR number received from [employer] on 09/11/2012  FL2 transmitted to all facilities in geographic area requested by pt/family on  09/11/2012 FL2 transmitted to all facilities within larger geographic area on   Patient informed that his/her managed care company has contracts with or will negotiate with  certain facilities, including the following:     Patient/family informed of bed offers received:   Patient chooses bed at  Physician recommends and patient chooses bed at    Patient to be transferred to  on   Patient to be transferred to facility by   The following physician request were entered in Epic:   Additional Comments:    Jacklynn Lewis, MSW, LCSWA (coverage) Clinical Social Work

## 2012-09-11 NOTE — Progress Notes (Signed)
Clinical Social Work Department BRIEF PSYCHOSOCIAL ASSESSMENT 09/11/2012  Patient:  OANH, DEVIVO     Account Number:  [id number]     Admit date:  09/11/2012  Clinical Social Worker:  Jacelyn Grip  Date/Time:  09/11/2012 04:00 PM  Referred by:  Physician  Date Referred:  09/11/2012 Referred for  SNF Placement   Other Referral:   Interview type:  Patient Other interview type:    PSYCHOSOCIAL DATA Living Status:  HUSBAND Admitted from facility:   Level of care:   Primary support name:  Theron Arista Jakes/spouse Primary support relationship to patient:  SPOUSE Degree of support available:   adequate    CURRENT CONCERNS Current Concerns  Post-Acute Placement   Other Concerns:    SOCIAL WORK ASSESSMENT / PLAN CSW received referral for New SNF.    CSW met with pt at bedside re: disposition planning. Pt reports that she lives with spouse, but pt spouse travels during the week and pt will not have 24 hour care at home and has 15 stairs to enter the home. Pt is interested in rehab placement for a short time. CSW discussed that rehab placement can be explored, but pt insurance will be the one to determine if pt insurance will cover placement at rehab. Pt expressed understanding and is interested [facility].    CSW contacted [facility] and sent pt clinicals. CSW will continue to follow and assist with disposition planning.   Assessment/plan status:  Psychosocial Support/Ongoing Assessment of Needs Other assessment/ plan:   discharge planning   Information/referral to community resources:   Referal to [facility] for possible rehab placement    PATIENT'S/FAMILY'S RESPONSE TO PLAN OF CARE: Pt alert and oriented x 4. Pt is hopeful that insurance may cover SNF placement, because at this time she feels she will need some assistance after d/c and is unsure she will be able to manage at home alone.     Jacklynn Lewis, MSW, LCSWA  Clinical Social Work [phone]

## 2012-09-12 ENCOUNTER — Encounter (HOSPITAL_COMMUNITY): Payer: Self-pay | Admitting: Orthopedic Surgery

## 2012-09-12 LAB — BASIC METABOLIC PANEL
BUN: 8 mg/dL (ref 6–23)
CO2: 26 mEq/L (ref 19–32)
Chloride: 104 mEq/L (ref 96–112)
Creatinine, Ser: 0.73 mg/dL (ref 0.50–1.10)
Glucose, Bld: 113 mg/dL — ABNORMAL HIGH (ref 70–99)

## 2012-09-12 LAB — CBC
HCT: 28.6 % — ABNORMAL LOW (ref 36.0–46.0)
Hemoglobin: 10.1 g/dL — ABNORMAL LOW (ref 12.0–15.0)
MCV: 90.5 fL (ref 78.0–100.0)
RBC: 3.16 MIL/uL — ABNORMAL LOW (ref 3.87–5.11)
WBC: 11.6 10*3/uL — ABNORMAL HIGH (ref 4.0–10.5)

## 2012-09-12 NOTE — Evaluation (Signed)
Occupational Therapy Evaluation Patient Details Name: Michelle Miranda MRN: [id number] DOB: 03-26-1951 Today's Date: 09/12/2012 Time: 4098-1191 OT Time Calculation (min): 26 min  OT Assessment / Plan / Recommendation Clinical Impression  This 62 year old female was admitted for L TKA.  She has h/o R TKA in the past.  Pt will benefit from skilled OT to increase independence with adls with supervision to min A level goals in acute    OT Assessment  Patient needs continued OT Services    Follow Up Recommendations  SNF    Barriers to Discharge      Equipment Recommendations  None recommended by OT    Recommendations for Other Services    Frequency  Min 2X/week    Precautions / Restrictions Precautions Precautions: Knee Required Braces or Orthoses: Knee Immobilizer - Left Knee Immobilizer - Left: Discontinue once straight leg raise with < 10 degree lag Restrictions Other Position/Activity Restrictions: WBAT L LE   Pertinent Vitals/Pain 5/10 L knee    ADL  Grooming: Set up;Wash/dry hands;Wash/dry face Where Assessed - Grooming: Unsupported sitting Upper Body Bathing: Set up Where Assessed - Upper Body Bathing: Unsupported sitting Lower Body Bathing: Moderate assistance Where Assessed - Lower Body Bathing: Supported sit to stand Upper Body Dressing: Minimal assistance (iv) Where Assessed - Upper Body Dressing: Unsupported sitting Lower Body Dressing: Moderate assistance Where Assessed - Lower Body Dressing: Supported sit to stand Equipment Used: Agricultural consultant Transfers/Ambulation Related to ADLs: pt got dizzy earlier with PT.  Sitting BP 107/55; standing 121/80.  Did not perform 3:1 transfer as pt has catheter and was in pain.   ADL Comments: Did not review AE today.  Will review next visit.    OT Diagnosis: Generalized weakness  OT Problem List: Decreased strength;Decreased activity tolerance;Pain;Decreased knowledge of use of DME or AE OT Treatment Interventions:  Self-care/ADL training;DME and/or AE instruction;Patient/family education   OT Goals Acute Rehab OT Goals OT Goal Formulation: With patient Time For Goal Achievement: 09/19/12 Potential to Achieve Goals: Good ADL Goals Pt Will Transfer to Toilet: with supervision;Ambulation;3-in-1 ADL Goal: Toilet Transfer - Progress: Goal set today Pt Will Perform Toileting - Hygiene: with supervision;Sit to stand from 3-in-1/toilet ADL Goal: Toileting - Hygiene - Progress: Goal set today Pt Will Perform Tub/Shower Transfer: Shower transfer;with min assist (min guard with 3:1 in shower) ADL Goal: Tub/Shower Transfer - Progress: Goal set today Miscellaneous OT Goals Miscellaneous OT Goal #1: Pt will perform LB adls with min A, using AE as needed sit to stand OT Goal: Miscellaneous Goal #1 - Progress: Goal set today  Visit Information  Last OT Received On: 09/12/12 Assistance Needed: +1    Subjective Data      Prior Functioning     Home Living Lives With: Spouse Type of Home: House Home Access: Stairs to enter Secretary/administrator of Steps: 15 Entrance Stairs-Rails: Left;Right Home Layout: One level Bathroom Shower/Tub: Health visitor: Standard Home Adaptive Equipment: Bedside commode/3-in-1;Walker - rolling;Reacher;Crutches Prior Function Level of Independence: Independent Communication Communication: No difficulties Dominant Hand: Right         Vision/Perception     Cognition  Cognition Overall Cognitive Status: Appears within functional limits for tasks assessed/performed Arousal/Alertness: Awake/alert Orientation Level: Appears intact for tasks assessed Behavior During Session: [facility] for tasks performed    Extremity/Trunk Assessment Right Upper Extremity Assessment RUE ROM/Strength/Tone: Within functional levels Left Upper Extremity Assessment LUE ROM/Strength/Tone: Within functional levels     Mobility Transfers Sit to Stand: 4: Min guard;With  armrests;From chair/3-in-1 Details for Transfer Assistance: vcs for hand placement and extending leg     Exercise     Balance     End of Session OT - End of Session Activity Tolerance: Patient limited by pain Patient left: in chair;with call bell/phone within reach  GO     [facility] 09/12/2012, 10:33 AM [name], OTR/L [phone] 09/12/2012

## 2012-09-12 NOTE — Progress Notes (Signed)
   Subjective: 1 Day Post-Op Procedure(s) (LRB): Left Total Knee Arthroplasty (Left) Patient reports pain as mild.   Patient seen in rounds with Dr. Lequita Halt. Patient is well, and has had no acute complaints or problems.   She states that she is doing much better this time as compared to the previous surgery. We will start therapy today.  Plan is to go Home versus SNF after hospital stay.  Objective: Vital signs in last 24 hours: Temp:  [97.6 F (36.4 C)-99.1 F (37.3 C)] 98.2 F (36.8 C) (02/11 0553) Pulse Rate:  [66-112] 75 (02/11 0553) Resp:  [14-20] 16 (02/11 0553) BP: (100-144)/(60-88) 102/69 mmHg (02/11 0553) SpO2:  [96 %-100 %] 96 % (02/11 0553) Weight:  [77.111 kg (170 lb)] 77.111 kg (170 lb) (02/10 0955)  Intake/Output from previous day:  Intake/Output Summary (Last 24 hours) at 09/12/12 0809 Last data filed at 09/12/12 0553  Gross per 24 hour  Intake 3738.75 ml  Output   5325 ml  Net -1586.25 ml    Intake/Output this shift:    Labs:  Recent Labs  09/12/12 0400  HGB 10.1*    Recent Labs  09/12/12 0400  WBC 11.6*  RBC 3.16*  HCT 28.6*  PLT 264    Recent Labs  09/12/12 0400  NA 137  K 3.9  CL 104  CO2 26  BUN 8  CREATININE 0.73  GLUCOSE 113*  CALCIUM 8.3*   No results found for this basename: LABPT, INR,  in the last 72 hours  EXAM General - Patient is Alert, Appropriate and Oriented Extremity - Neurovascular intact Sensation intact distally Dorsiflexion/Plantar flexion intact Dressing - dressing C/D/I Motor Function - intact, moving foot and toes well on exam.  Hemovac pulled without difficulty.  Past Medical History  Diagnosis Date  . Osteopenia   . Arthritis   . Urinary tract infection 12-15-2011    Assessment/Plan: 1 Day Post-Op Procedure(s) (LRB): Left Total Knee Arthroplasty (Left) Active Problems:   * No active hospital problems. *  Estimated body mass index is 32.14 kg/(m^2) as calculated from the following:   Height  as of this encounter: 5\' 1"  (1.549 m).   Weight as of this encounter: 77.111 kg (170 lb). Advance diet Up with therapy Discharge to SNF if bed available depending upon insurance.  DVT Prophylaxis - Xarelto Weight-Bearing as tolerated to left leg No vaccines. D/C O2 and Pulse OX and try on Room Air  Michelle Miranda, Marlowe Sax 09/12/2012, 8:09 AM

## 2012-09-12 NOTE — Progress Notes (Signed)
Physical Therapy Treatment Note   09/12/12 1500  PT Visit Information  Last PT Received On 09/12/12  Assistance Needed +1  PT Time Calculation  PT Start Time 1412  PT Stop Time 1440  PT Time Calculation (min) 28 min  Subjective Data  Subjective Ok I'm ready  Precautions  Precautions Knee  Required Braces or Orthoses Knee Immobilizer - Left  Knee Immobilizer - Left Discontinue once straight leg raise with < 10 degree lag  Restrictions  Other Position/Activity Restrictions WBAT L LE  Cognition  Overall Cognitive Status Appears within functional limits for tasks assessed/performed  Bed Mobility  Bed Mobility Sit to Supine  Sit to Supine 4: Min assist  Details for Bed Mobility Assistance verbal cues for technique, assist for L LE  Transfers  Transfers Stand to Sit;Sit to Stand  Sit to Stand 4: Min guard;With upper extremity assist;From chair/3-in-1  Stand to Sit 4: Min guard;With upper extremity assist;To bed  Details for Transfer Assistance verbal cues for safe technique  Ambulation/Gait  Ambulation/Gait Assistance 4: Min assist  Ambulation Distance (Feet) 20 Feet  Assistive device Rolling walker  Ambulation/Gait Assistance Details verbal cues for sequence, step length, posture, WBing through UEs on RW to assist with pain control during ambulation  Gait Pattern Step-to pattern;Antalgic  Exercises  Exercises Total Joint  Total Joint Exercises  Ankle Circles/Pumps AROM;Both;15 reps  [employer] AROM;15 reps;Both  Gluteal Sets AROM;Both;15 reps  Short [employer] AAROM;15 reps;Left  Heel Slides AAROM;Left;15 reps  Hip ABduction/ADduction AAROM;Left;15 reps  Goniometric ROM active assist knee flexion during heel slide with sheet approx 35* limited by pain  PT - End of Session  Equipment Utilized During Treatment Left knee immobilizer  Activity Tolerance Patient limited by fatigue;Patient limited by pain  Patient left in bed;with call bell/phone within reach;with family/visitor  present  PT - Assessment/Plan  Comments on Treatment Session Pt able to tolerate short distance ambulation and performed exercises.  PT Plan Discharge plan remains appropriate;Frequency remains appropriate  Follow Up Recommendations SNF  PT equipment None recommended by PT  Acute Rehab PT Goals  PT Goal: Sit to Stand - Progress Progressing toward goal  PT Goal: Stand to Sit - Progress Progressing toward goal  PT Goal: Ambulate - Progress Progressing toward goal  PT Goal: Perform Home Exercise Program - Progress Progressing toward goal  PT General Charges  $$ ACUTE PT VISIT 1 Procedure  PT Treatments  $Gait Training 8-22 mins  $Therapeutic Exercise 8-22 mins  Ice applied to knee after session  Zenovia Jarred, PT, DPT 09/12/2012 Pager: [phone]

## 2012-09-12 NOTE — Progress Notes (Signed)
Physical Therapy Treatment Patient Details Name: LUSERO NORDLUND MRN: [id number] DOB: January 03, 1951 Today's Date: 09/12/2012 Time: 1191-4782 PT Time Calculation (min): 16 min  PT Assessment / Plan / Recommendation Comments on Treatment Session  Pt premedicated just prior to therapy however pain and dizziness limited ambulation distance.  Pt prefered to let pain ease and perform exercises and try ambulation again this afternoon.    Follow Up Recommendations  SNF     Does the patient have the potential to tolerate intense rehabilitation     Barriers to Discharge        Equipment Recommendations  None recommended by PT    Recommendations for Other Services    Frequency     Plan Frequency remains appropriate;Discharge plan needs to be updated    Precautions / Restrictions Precautions Precautions: Knee Required Braces or Orthoses: Knee Immobilizer - Left Knee Immobilizer - Left: Discontinue once straight leg raise with < 10 degree lag Restrictions Other Position/Activity Restrictions: WBAT L LE   Pertinent Vitals/Pain Premedicated, ice applied    Mobility  Bed Mobility Bed Mobility: Supine to Sit Supine to Sit: 4: Min assist;HOB elevated Details for Bed Mobility Assistance: assist required for L LE today, verbal cues for technique Transfers Transfers: Stand to Sit;Sit to Stand Sit to Stand: With upper extremity assist;From bed;4: Min assist Stand to Sit: 4: Min guard;With upper extremity assist;To chair/3-in-1 Details for Transfer Assistance: verbal cues for safe technique Ambulation/Gait Ambulation/Gait Assistance: 4: Min assist Ambulation Distance (Feet): 6 Feet Assistive device: Rolling walker Ambulation/Gait Assistance Details: pt reports dizziness and increased pain with ambulation limiting distance, educated to use RW and upper body to take weight away from L LE, recliner brought behind pt Gait Pattern: Step-to pattern;Antalgic    Exercises     PT Diagnosis:    PT  Problem List:   PT Treatment Interventions:     PT Goals Acute Rehab PT Goals PT Goal: Sit to Stand - Progress: Progressing toward goal PT Goal: Stand to Sit - Progress: Progressing toward goal PT Goal: Ambulate - Progress: Progressing toward goal  Visit Information  Last PT Received On: 09/12/12 Assistance Needed: +1    Subjective Data  Subjective: I can put my make-up on after we get to the chair.   Cognition  Cognition Overall Cognitive Status: Appears within functional limits for tasks assessed/performed Arousal/Alertness: Awake/alert Orientation Level: Appears intact for tasks assessed Behavior During Session: [facility] for tasks performed    Balance     End of Session PT - End of Session Equipment Utilized During Treatment: Left knee immobilizer Activity Tolerance: Patient limited by fatigue;Patient limited by pain Patient left: in chair;with call bell/phone within reach   GP     Zykeem Bauserman,KATHrine E 09/12/2012, 10:36 AM Zenovia Jarred, PT, DPT 09/12/2012 Pager: [phone]

## 2012-09-13 LAB — BASIC METABOLIC PANEL
BUN: 8 mg/dL (ref 6–23)
Chloride: 103 mEq/L (ref 96–112)
Creatinine, Ser: 0.7 mg/dL (ref 0.50–1.10)
GFR calc Af Amer: >90 mL/min (ref >90)

## 2012-09-13 LAB — CBC
HCT: 29.3 % — ABNORMAL LOW (ref 36.0–46.0)
MCH: 31.3 pg (ref 26.0–34.0)
MCV: 91.8 fL (ref 78.0–100.0)
RDW: 12.8 % (ref 11.5–15.5)
WBC: 12.4 10*3/uL — ABNORMAL HIGH (ref 4.0–10.5)

## 2012-09-13 NOTE — Progress Notes (Signed)
Physical Therapy Treatment Patient Details Name: Michelle Miranda MRN: [id number] DOB: [birth year] Today's Date: 09/13/2012 Time: 4696-2952 PT Time Calculation (min): 40 min  PT Assessment / Plan / Recommendation Comments on Treatment Session  POD # 2 L TKR am session.  Pt progressing slowly with c/o increased pain.  Pt requires increased time and demon limited activity tolerance due to pain and fatigue. Pt feels she is not moving as well as she did with her first TKR. Pt has 15 steps to get into her home which at this point pt will be unable to safely perform.  Pt plans to D/C to SNF for ST rehab prior to D/C to home.    Follow Up Recommendations  SNF     Does the patient have the potential to tolerate intense rehabilitation     Barriers to Discharge        Equipment Recommendations  None recommended by PT    Recommendations for Other Services    Frequency 7X/week   Plan Discharge plan remains appropriate;Frequency remains appropriate    Precautions / Restrictions Precautions Precautions: Knee Precaution Comments: Instructed pt on KI use for amb Required Braces or Orthoses: Knee Immobilizer - Left Knee Immobilizer - Left: Discontinue once straight leg raise with < 10 degree lag Restrictions Weight Bearing Restrictions: No Other Position/Activity Restrictions: WBAT L LE   Pertinent Vitals/Pain C/o 5/10 during amb C/o 7/10 during TE's Pre medicated ICE applied    Mobility  Bed Mobility Bed Mobility: Not assessed Supine to Sit: 4: Min assist;HOB elevated Details for Bed Mobility Assistance: Pt OOB in recliner Transfers Transfers: Stand to Sit;Sit to Stand Sit to Stand: 4: Min guard Stand to Sit: 4: Min guard Details for Transfer Assistance: 25% VC's to extend L LE prior to sit and reach back with both hands for increased safety Ambulation/Gait Ambulation/Gait Assistance: 4: Min assist Ambulation Distance (Feet): 18 Feet Assistive device: Rolling walker Ambulation/Gait  Assistance Details: 25% VC's on proper sequencing and upright posture.  Pt demon limited amb distance 2nd pain and MAX c/o fatigue. Gait Pattern: Step-to pattern;Antalgic Gait velocity: decreased    Exercises Total Joint Exercises Ankle Circles/Pumps: AROM;Both;10 reps Quad Sets: AROM;Both;10 reps Gluteal Sets: AROM;Both;10 reps Towel Squeeze: AROM;Both;10 reps Heel Slides: AAROM;Left;10 reps Hip ABduction/ADduction: AAROM;Left;10 reps Straight Leg Raises: AAROM;Left;10 reps    PT Goals                                                  Progressing slowly    Visit Information  Last PT Received On: 09/13/12 Assistance Needed: +1    Subjective Data      Cognition  Cognition Overall Cognitive Status: Appears within functional limits for tasks assessed/performed Behavior During Session: [facility] for tasks performed    Balance   poor  End of Session PT - End of Session Equipment Utilized During Treatment: Gait belt;Left knee immobilizer Activity Tolerance: Patient limited by fatigue;Patient limited by pain Patient left: in chair;with call bell/phone within reach;Other (comment) (ICE to L knee) CPM Left Knee CPM Left Knee: Off   Felecia Shelling  PTA [facility]  Acute  Rehab Pager      [phone]

## 2012-09-13 NOTE — Progress Notes (Signed)
CSW assisting with d/c planning. [facility] is working with [employer] for prior authorization. No decision has been made at this time. CSW has updated pt. CSW will continue to follow to assist with d/c planning.  Cori Razor LCSW [phone]

## 2012-09-13 NOTE — Progress Notes (Signed)
Occupational Therapy Treatment Patient Details Name: Michelle Miranda MRN: [id number] DOB: 20-May-1951 Today's Date: 09/13/2012 Time: 0865-7846 OT Time Calculation (min): 26 min  OT Assessment / Plan / Recommendation Comments on Treatment Session      Follow Up Recommendations   SNF--alone during day    Barriers to Discharge       Equipment Recommendations   None recommended by OT    Recommendations for Other Services    Frequency  2x   Plan      Precautions / Restrictions Precautions Precautions: Knee Required Braces or Orthoses: Knee Immobilizer - Left Knee Immobilizer - Left: Discontinue once straight leg raise with < 10 degree lag Restrictions Weight Bearing Restrictions: No Other Position/Activity Restrictions: WBAT L LE   Pertinent Vitals/Pain 3/10 LLE when weightbearing.  Repositioned with ice    ADL  Grooming: Teeth care;Wash/dry hands;Supervision/safety Where Assessed - Grooming: Supported standing Lower Body Bathing:  (partially performed:  supervision peri area) Toilet Transfer: Min Pension scheme manager Method: Sit to Barista: Raised toilet seat with arms (or 3-in-1 over toilet) Toileting - Clothing Manipulation and Hygiene: Supervision/safety Where Assessed - Engineer, mining and Hygiene: Standing Equipment Used: Rolling walker Transfers/Ambulation Related to ADLs: vcs for extending LLE ADL Comments: Not ready to practice shower yet:  a little dizzy in bathroom.  Reviewed sequence    OT Diagnosis:    OT Problem List:   OT Treatment Interventions:     OT Goals ADL Goals Pt Will Transfer to Toilet: with supervision;Ambulation;3-in-1 ADL Goal: Toilet Transfer - Progress: Progressing toward goals Pt Will Perform Toileting - Hygiene: with supervision;Sit to stand from 3-in-1/toilet ADL Goal: Toileting - Hygiene - Progress: Met Miscellaneous OT Goals Miscellaneous OT Goal #1: Pt will perform LB adls with min A, using AE  as needed sit to stand OT Goal: Miscellaneous Goal #1 - Progress: Progressing toward goals  Visit Information  Last OT Received On: 09/13/12 Assistance Needed: +1    Subjective Data      Prior Functioning       Cognition  Cognition Overall Cognitive Status: Appears within functional limits for tasks assessed/performed Behavior During Session: [facility] for tasks performed    Mobility  Bed Mobility Supine to Sit: 4: Min assist;HOB elevated Transfers Sit to Stand: 5: Supervision (vcs for LLE) Details for Transfer Assistance: vcs for LLE    Exercises      Balance     End of Session OT - End of Session Activity Tolerance: Patient tolerated treatment well Patient left: in chair;with call bell/phone within reach CPM Left Knee CPM Left Knee: Off  GO     Michelle Miranda 09/13/2012, 9:19 AM .[name], OTR/L [phone] 09/13/2012

## 2012-09-13 NOTE — Progress Notes (Signed)
Physical Therapy Treatment Patient Details Name: Michelle Miranda MRN: [id number] DOB: 05/12/1951 Today's Date: 09/13/2012 Time: 4098-1191 PT Time Calculation (min): 27 min  PT Assessment / Plan / Recommendation Comments on Treatment Session  POD #2 L TKR pm session.  Assisted pt OOB to amb limited distance in hallway then back to bed with max c/o fatigue. Pt continues to progress slowly and will need ST SNF prior to D/C to home.    Follow Up Recommendations  SNF     Does the patient have the potential to tolerate intense rehabilitation     Barriers to Discharge        Equipment Recommendations  None recommended by PT    Recommendations for Other Services    Frequency 7X/week   Plan Discharge plan remains appropriate;Frequency remains appropriate    Precautions / Restrictions Precautions Precautions: Knee Precaution Comments: Instructed pt on KI use for amb Required Braces or Orthoses: Knee Immobilizer - Left Knee Immobilizer - Left: Discontinue once straight leg raise with < 10 degree lag Restrictions Weight Bearing Restrictions: No Other Position/Activity Restrictions: WBAT L LE   Pertinent Vitals/Pain C/o 8/10 L knee    Mobility  Bed Mobility Bed Mobility: Supine to Sit;Sit to Supine Supine to Sit: 4: Min assist;HOB elevated Sit to Supine: 4: Min assist Details for Bed Mobility Assistance: min assist to support L LE off/on bed Transfers Transfers: Sit to Stand;Stand to Sit Sit to Stand: 4: Min guard;From bed Stand to Sit: 4: Min guard;To bed Details for Transfer Assistance: 25% VC's to extend L LE prior to sit and reach back with both hands for increased safety Ambulation/Gait Ambulation/Gait Assistance: 4: Min assist Ambulation Distance (Feet): 21 Feet Assistive device: Rolling walker Ambulation/Gait Assistance Details: 25% VC's on proper sequencing and increased time as pt demon max difficulty weight shifting and advancing opposite LE. Gait Pattern: Step-to  pattern;Antalgic;Decreased stride length;Trunk flexed Gait velocity: decreased    Felecia Shelling  PTA [facility]  Acute  Rehab Pager      [phone] PT Goals    Visit Information  Last PT Received On: 09/13/12    Subjective Data      Cognition       Balance     End of Session PT - End of Session Equipment Utilized During Treatment: Gait belt;Left knee immobilizer Activity Tolerance: Patient limited by fatigue;Patient limited by pain Patient left: with call bell/phone within reach;Other (comment);in bed CPM Left Knee CPM Left Knee: On  Felecia Shelling  PTA [facility]  Acute  Rehab Pager      [phone]

## 2012-09-13 NOTE — Progress Notes (Signed)
   Subjective: 2 Days Post-Op Procedure(s) (LRB): Left Total Knee Arthroplasty (Left) Patient reports pain as mild.   Patient seen in rounds with Dr. Lequita Halt. Patient is well, and has had no acute complaints or problems Plan is to go Skilled nursing facility after hospital stay.  Objective: Vital signs in last 24 hours: Temp:  [98.3 F (36.8 C)-99.1 F (37.3 C)] 98.9 F (37.2 C) (02/12 0638) Pulse Rate:  [76-104] 76 (02/12 0638) Resp:  [15-16] 16 (02/12 0638) BP: (107-116)/(55-72) 116/70 mmHg (02/12 0638) SpO2:  [93 %-98 %] 93 % (02/12 3086)  Intake/Output from previous day:  Intake/Output Summary (Last 24 hours) at 09/13/12 0716 Last data filed at 09/13/12 5784  Gross per 24 hour  Intake   2695 ml  Output   3000 ml  Net   -305 ml    Intake/Output this shift:    Labs:  Recent Labs  09/12/12 0400 09/13/12 0405  HGB 10.1* 10.0*    Recent Labs  09/12/12 0400 09/13/12 0405  WBC 11.6* 12.4*  RBC 3.16* 3.19*  HCT 28.6* 29.3*  PLT 264 260    Recent Labs  09/12/12 0400 09/13/12 0405  NA 137 136  K 3.9 3.8  CL 104 103  CO2 26 25  BUN 8 8  CREATININE 0.73 0.70  GLUCOSE 113* 102*  CALCIUM 8.3* 8.3*   No results found for this basename: LABPT, INR,  in the last 72 hours  EXAM General - Patient is Alert, Appropriate and Oriented Extremity - Neurovascular intact Sensation intact distally Intact pulses distally No cellulitis present Dressing/Incision - clean, dry, no drainage, healing Motor Function - intact, moving foot and toes well on exam.   Past Medical History  Diagnosis Date  . Osteopenia   . Arthritis   . Urinary tract infection 12-15-2011    Assessment/Plan: 2 Days Post-Op Procedure(s) (LRB): Left Total Knee Arthroplasty (Left) Active Problems:   * No active hospital problems. *  Estimated body mass index is 32.14 kg/(m^2) as calculated from the following:   Height as of this encounter: 5\' 1"  (1.549 m).   Weight as of this encounter:  77.111 kg (170 lb). D/C IV fluids Plan for discharge tomorrow Discharge to SNF  DVT Prophylaxis - Xarelto Weight-Bearing as tolerated to left leg  Gabbriella Presswood 09/13/2012, 7:16 AM

## 2012-09-14 LAB — CBC
Hemoglobin: 9.8 g/dL — ABNORMAL LOW (ref 12.0–15.0)
MCH: 30.7 pg (ref 26.0–34.0)
MCHC: 33 g/dL (ref 30.0–36.0)
RDW: 12.5 % (ref 11.5–15.5)

## 2012-09-14 MED ORDER — BISACODYL 10 MG RE SUPP: 10.0000 mg | Freq: Every day | RECTAL | Status: DC | PRN

## 2012-09-14 MED ORDER — TRAMADOL HCL 50 MG PO TABS: 50.0000 mg | ORAL_TABLET | Freq: Four times a day (QID) | ORAL | Status: DC | PRN

## 2012-09-14 MED ORDER — DSS 100 MG PO CAPS: 100.0000 mg | ORAL_CAPSULE | Freq: Two times a day (BID) | ORAL | Status: DC

## 2012-09-14 MED ORDER — ACETAMINOPHEN 325 MG PO TABS: 650.0000 mg | ORAL_TABLET | Freq: Four times a day (QID) | ORAL | Status: DC | PRN

## 2012-09-14 MED ORDER — POLYETHYLENE GLYCOL 3350 17 G PO PACK: 17.0000 g | PACK | Freq: Every day | ORAL | Status: DC | PRN

## 2012-09-14 MED ORDER — METHOCARBAMOL 500 MG PO TABS: 500.0000 mg | ORAL_TABLET | Freq: Four times a day (QID) | ORAL | Status: DC | PRN

## 2012-09-14 MED ORDER — RIVAROXABAN 10 MG PO TABS: 10.0000 mg | ORAL_TABLET | Freq: Every day | ORAL | Status: DC

## 2012-09-14 MED ORDER — HYDROMORPHONE HCL 2 MG PO TABS: 2.0000 mg | ORAL_TABLET | ORAL | Status: DC | PRN

## 2012-09-14 MED ORDER — ONDANSETRON HCL 4 MG PO TABS: 4.0000 mg | ORAL_TABLET | Freq: Four times a day (QID) | ORAL | Status: DC | PRN

## 2012-09-14 NOTE — Progress Notes (Signed)
   Subjective: 3 Days Post-Op Procedure(s) (LRB): Left Total Knee Arthroplasty (Left) Patient reports pain as mild.   Patient seen in rounds with Dr. Lequita Halt. Patient is well, and has had no acute complaints or problems Plan is to go Skilled nursing facility after hospital stay.  Objective: Vital signs in last 24 hours: Temp:  [99.5 F (37.5 C)-99.9 F (37.7 C)] 99.5 F (37.5 C) (02/13 0415) Pulse Rate:  [70-104] 104 (02/13 0415) Resp:  [16] 16 (02/13 0430) BP: (103-109)/(57-69) 109/64 mmHg (02/13 0415) SpO2:  [96 %-100 %] 98 % (02/13 0415)  Intake/Output from previous day:  Intake/Output Summary (Last 24 hours) at 09/14/12 0947 Last data filed at 09/13/12 1955  Gross per 24 hour  Intake    480 ml  Output   1300 ml  Net   -820 ml    Intake/Output this shift:    Labs:  Recent Labs  09/12/12 0400 09/13/12 0405 09/14/12 0413  HGB 10.1* 10.0* 9.8*    Recent Labs  09/13/12 0405 09/14/12 0413  WBC 12.4* 9.4  RBC 3.19* 3.19*  HCT 29.3* 29.7*  PLT 260 264    Recent Labs  09/12/12 0400 09/13/12 0405  NA 137 136  K 3.9 3.8  CL 104 103  CO2 26 25  BUN 8 8  CREATININE 0.73 0.70  GLUCOSE 113* 102*  CALCIUM 8.3* 8.3*   No results found for this basename: LABPT, INR,  in the last 72 hours  EXAM General - Patient is Alert, Appropriate and Oriented Extremity - Neurovascular intact Sensation intact distally Dorsiflexion/Plantar flexion intact Dressing/Incision - clean, dry, no drainage, healing Motor Function - intact, moving foot and toes well on exam.   Past Medical History  Diagnosis Date  . Osteopenia   . Arthritis   . Urinary tract infection 12-15-2011    Assessment/Plan: 3 Days Post-Op Procedure(s) (LRB): Left Total Knee Arthroplasty (Left) Active Problems:   * No active hospital problems. *  Estimated body mass index is 32.14 kg/(m^2) as calculated from the following:   Height as of this encounter: 5\' 1"  (1.549 m).   Weight as of this  encounter: 77.111 kg (170 lb). Up with therapy Plan for discharge tomorrow Discharge to SNF  DVT Prophylaxis - Xarelto Weight-Bearing as tolerated to left leg  Gotti Alwin 09/14/2012, 9:47 AM

## 2012-09-14 NOTE — Progress Notes (Signed)
Physical Therapy Treatment Patient Details Name: Michelle Miranda MRN: [id number] DOB: 07-14-1951 Today's Date: 09/14/2012 Time: 1478-2956 PT Time Calculation (min): 25 min  PT Assessment / Plan / Recommendation Comments on Treatment Session  POD #3 L TKR pm session.  Assisted pt to BR then amb in hallway then back to bed for CPM.  Pt was approved by [facility] for ST Rehab.    Follow Up Recommendations  SNF     Does the patient have the potential to tolerate intense rehabilitation     Barriers to Discharge        Equipment Recommendations  None recommended by PT    Recommendations for Other Services    Frequency 7X/week   Plan Discharge plan remains appropriate;Frequency remains appropriate    Precautions / Restrictions Precautions Precautions: Knee Precaution Comments: Instructed pt on KI use for amb Required Braces or Orthoses: Knee Immobilizer - Left Knee Immobilizer - Left: Discontinue once straight leg raise with < 10 degree lag Restrictions Weight Bearing Restrictions: No Other Position/Activity Restrictions: WBAT L LE   Pertinent Vitals/Pain C/o 6/10 knee pain meds giveICE applied    Mobility  Bed Mobility Bed Mobility: Supine to Sit;Sit to Supine Supine to Sit: 4: Min assist;HOB elevated Sit to Supine: 4: Min assist Details for Bed Mobility Assistance: min assist to support L LE off/on bed Transfers Transfers: Sit to Stand;Stand to Sit Sit to Stand: 4: Min guard;From bed Stand to Sit: 4: Min guard;To bed Details for Transfer Assistance: 25% VC's to extend L LE prior to sit and reach back with both hands for increased safety Ambulation/Gait Ambulation/Gait Assistance: 4: Min assist Ambulation Distance (Feet): 18 Feet Assistive device: Rolling walker Ambulation/Gait Assistance Details: 25% VC's on proper upright posture and walker to self distance.  Pt continues to have difficulty weight bearing functionally and requires increased time. Gait Pattern: Step-to  pattern;Antalgic;Decreased stride length;Trunk flexed Gait velocity: decreased    Exercises Total Joint Exercises Ankle Circles/Pumps: AROM;Both;10 reps Quad Sets: AROM;Both;10 reps Gluteal Sets: AROM;Both;10 reps Towel Squeeze: AROM;Both;10 reps Short Arc Quad: AAROM;15 reps;Left Heel Slides: AAROM;Left;10 reps Hip ABduction/ADduction: AAROM;Left;10 reps Straight Leg Raises: AAROM;Left;10 reps   PT Diagnosis:    PT Problem List:   PT Treatment Interventions:     PT Goals    Visit Information  Last PT Received On: 09/14/12 Assistance Needed: +1    Subjective Data      Cognition       Balance     End of Session PT - End of Session Equipment Utilized During Treatment: Gait belt;Left knee immobilizer Activity Tolerance: Patient limited by fatigue;Patient limited by pain Patient left: with call bell/phone within reach;Other (comment);in bed   Felecia Shelling  PTA [facility]  Acute  Rehab Pager      [phone]

## 2012-09-14 NOTE — Progress Notes (Signed)
CSW assisting with d/c planning. [facility] is unable to accept admissions today. Admissions coordinator will continue to work with [facility] for prior auth for SNF placement. SNF bed will be available tomorrow pending BCBS approval. Pt has been updated.   Cori Razor LCSW [phone]

## 2012-09-14 NOTE — Progress Notes (Signed)
Physical Therapy Treatment Patient Details Name: MALINI FLEMINGS MRN: [id number] DOB: 10-29-1950 Today's Date: 09/14/2012 Time: 4098-1191 PT Time Calculation (min): 24 min  PT Assessment / Plan / Recommendation Comments on Treatment Session  POD # 3 L TKR am session.  Assisted pt OOB to BR then amb limited distance.  Performed TE's then applied ICE.  Pt progressing slowly and plans to D/C to SNF.    Follow Up Recommendations  SNF     Does the patient have the potential to tolerate intense rehabilitation     Barriers to Discharge        Equipment Recommendations  None recommended by PT    Recommendations for Other Services    Frequency 7X/week   Plan Discharge plan remains appropriate;Frequency remains appropriate    Precautions / Restrictions Precautions Precautions: Knee Precaution Comments: Instructed pt on KI use for amb Required Braces or Orthoses: Knee Immobilizer - Left Knee Immobilizer - Left: Discontinue once straight leg raise with < 10 degree lag Restrictions Weight Bearing Restrictions: No Other Position/Activity Restrictions: WBAT L LE   Pertinent Vitals/Pain C/o 4/10 with amb ICE applied    Mobility  Bed Mobility Bed Mobility: Supine to Sit;Sit to Supine Supine to Sit: 4: Min assist;HOB elevated Sit to Supine: 4: Min assist Details for Bed Mobility Assistance: min assist to support L LE off/on bed Transfers Transfers: Sit to Stand;Stand to Sit Sit to Stand: 4: Min guard;From bed Stand to Sit: 4: Min guard;To bed Details for Transfer Assistance: 25% VC's to extend L LE prior to sit and reach back with both hands for increased safety Ambulation/Gait Ambulation/Gait Assistance: 4: Min assist Ambulation Distance (Feet): 18 Feet Assistive device: Rolling walker Ambulation/Gait Assistance Details: 25% VC's on proper upright posture and walker to self distance.  Pt continues to have difficulty weight bearing functionally and requires increased time. Gait  Pattern: Step-to pattern;Antalgic;Decreased stride length;Trunk flexed Gait velocity: decreased    Exercises Total Joint Exercises Ankle Circles/Pumps: AROM;Both;10 reps Quad Sets: AROM;Both;10 reps Gluteal Sets: AROM;Both;10 reps Towel Squeeze: AROM;Both;10 reps Short Arc Quad: AAROM;15 reps;Left Heel Slides: AAROM;Left;10 reps Hip ABduction/ADduction: AAROM;Left;10 reps Straight Leg Raises: AAROM;Left;10 reps    PT Goals                                       Progressing slowly    Visit Information  Last PT Received On: 09/14/12    Subjective Data      Cognition       Balance     End of Session PT - End of Session Equipment Utilized During Treatment: Gait belt;Left knee immobilizer Activity Tolerance: Patient limited by fatigue;Patient limited by pain Patient left: with call bell/phone within reach;Other (comment);in bed   Felecia Shelling  PTA [facility]  Acute  Rehab Pager      [phone]
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
## 2012-09-14 NOTE — Progress Notes (Signed)
[facility] has received prior authorization from [facility] for ST Rehab at [facility] for [place]. Pt has been updated.  Cori Razor LCSW [phone]

## 2012-09-15 NOTE — Progress Notes (Signed)
Clinical Social Work Department CLINICAL SOCIAL WORK PLACEMENT NOTE 09/15/2012  Patient:  Michelle Miranda, Michelle Miranda  Account Number:  [id number] Admit date:  09/11/2012  Clinical Social Worker:  Jacelyn Grip  Date/time:  09/11/2012 04:30 PM  Clinical Social Work is seeking post-discharge placement for this patient at the following level of care:   SKILLED NURSING   (*CSW will update this form in Epic as items are completed)   09/11/2012  Patient/family provided with [facility] Department of Clinical Social Work's list of facilities offering this level of care within the geographic area requested by the patient (or if unable, by the patient's family).  09/11/2012  Patient/family informed of their freedom to choose among providers that offer the needed level of care, that participate in Medicare, Medicaid or managed care program needed by the patient, have an available bed and are willing to accept the patient.  09/11/2012  Patient/family informed of [facility]' ownership interest in [facility], as well as of the fact that they are under no obligation to receive care at this facility.  PASARR submitted to EDS on 09/11/2012 PASARR number received from EDS on 09/11/2012  FL2 transmitted to all facilities in geographic area requested by pt/family on  09/11/2012 FL2 transmitted to all facilities within larger geographic area on   Patient informed that his/her managed care company has contracts with or will negotiate with  certain facilities, including the following:     Patient/family informed of bed offers received:  09/12/2012 Patient chooses bed at [facility] Physician recommends and patient chooses bed at    Patient to be transferred to [facility] on  09/15/2012 Patient to be transferred to facility by   The following physician request were entered in Epic:   Additional Comments:  [employer] provided prior authorization for [facility] SNF placement.  Cori Razor LCSW  [phone]

## 2012-09-15 NOTE — Progress Notes (Signed)
Physical Therapy Treatment Patient Details Name: Michelle Miranda MRN: [id number] DOB: 01-Aug-1951 Today's Date: 09/15/2012 Time: 6213-0865 PT Time Calculation (min): 29 min  PT Assessment / Plan / Recommendation Comments on Treatment Session  POD #4  L TKR.  Assisted pt OOB to BR then amb in hallway, then to recliner to perform TKR TE's.  Applied ICE.  Pt plans to D/C to Rehab today.    Follow Up Recommendations  SNF     Does the patient have the potential to tolerate intense rehabilitation     Barriers to Discharge        Equipment Recommendations  None recommended by PT    Recommendations for Other Services    Frequency 7X/week   Plan Discharge plan remains appropriate;Frequency remains appropriate    Precautions / Restrictions Precautions Precautions: Knee Restrictions Weight Bearing Restrictions: No Other Position/Activity Restrictions: WBAT L LE   Pertinent Vitals/Pain C/o 5/10 during TE's ICE applied    Mobility  Bed Mobility Bed Mobility: Supine to Sit;Sit to Supine Supine to Sit: HOB elevated;4: Min guard Sit to Supine: 4: Min assist;4: Min guard Details for Bed Mobility Assistance: increased time Transfers Transfers: Sit to Stand;Stand to Sit Sit to Stand: 4: Min guard;From bed Stand to Sit: 4: Min guard;To bed Details for Transfer Assistance: 25% VC's to extend L LE prior to sit and reach back with both hands for increased safety Ambulation/Gait Ambulation/Gait Assistance: 4: Min assist Ambulation Distance (Feet): 28 Feet Assistive device: Rolling walker Ambulation/Gait Assistance Details: 25% VC's on upright posture and proper walker to self distance Gait Pattern: Step-to pattern;Antalgic;Decreased stride length;Trunk flexed Gait velocity: decreased    Exercises Total Joint Exercises Ankle Circles/Pumps: AROM;Both;10 reps Quad Sets: AROM;Both;10 reps Gluteal Sets: AROM;Both;10 reps Towel Squeeze: AROM;Both;10 reps Short Arc QuadBarbaraann Miranda;Left;10  reps Heel Slides: AAROM;Left;10 reps Hip ABduction/ADduction: AAROM;Left;10 reps Straight Leg Raises: AAROM;Left;10 reps   PT Goals                                                  progressing    Visit Information  Last PT Received On: 09/15/12    Subjective Data  Subjective: ready to go to Rehab   Cognition       Balance   fair  End of Session PT - End of Session Equipment Utilized During Treatment: Gait belt Activity Tolerance: Patient limited by fatigue Patient left: with call bell/phone within reach;Other (comment);in bed   Felecia Shelling  PTA [facility]  Acute  Rehab Pager      [phone]

## 2012-09-15 NOTE — Progress Notes (Signed)
   Subjective: 4 Days Post-Op Procedure(s) (LRB): Left Total Knee Arthroplasty (Left) Patient reports pain as 1 on 0-10 scale.   Plan is to go Skilled nursing facility after hospital stay.  Objective: Vital signs in last 24 hours: Temp:  [98.9 F (37.2 C)-100.3 F (37.9 C)] 98.9 F (37.2 C) (02/14 0424) Pulse Rate:  [73-95] 78 (02/14 0424) Resp:  [16] 16 (02/14 0741) BP: (112-131)/(55-75) 121/75 mmHg (02/14 0424) SpO2:  [97 %-100 %] 97 % (02/14 0424)  Intake/Output from previous day:  Intake/Output Summary (Last 24 hours) at 09/15/12 1036 Last data filed at 09/15/12 0800  Gross per 24 hour  Intake    720 ml  Output      0 ml  Net    720 ml    Intake/Output this shift: Total I/O In: 240 [P.O.:240] Out: -   Labs:  Recent Labs  09/13/12 0405 09/14/12 0413  HGB 10.0* 9.8*    Recent Labs  09/13/12 0405 09/14/12 0413  WBC 12.4* 9.4  RBC 3.19* 3.19*  HCT 29.3* 29.7*  PLT 260 264    Recent Labs  09/13/12 0405  NA 136  K 3.8  CL 103  CO2 25  BUN 8  CREATININE 0.70  GLUCOSE 102*  CALCIUM 8.3*   No results found for this basename: LABPT, INR,  in the last 72 hours  EXAM General - Patient is Alert, Appropriate and Oriented Extremity - Neurologically intact Dorsiflexion/Plantar flexion intact Incision: dressing C/D/I No cellulitis present Compartment soft Dressing/Incision - clean, dry, no drainage Motor Function - intact, moving foot and toes well on exam.   Past Medical History  Diagnosis Date  . Osteopenia   . Arthritis   . Urinary tract infection 12-15-2011    Assessment/Plan: 4 Days Post-Op Procedure(s) (LRB): Left Total Knee Arthroplasty (Left) Active Problems:   * No active hospital problems. *   Discharge to SNF today  DVT Prophylaxis - Xarelto Weight-Bearing as tolerated to left leg  Michelle Miranda 09/15/2012, 10:36 AM

## 2012-09-15 NOTE — Care Management Note (Signed)
    Page 1 of 1   09/15/2012     2:43:23 PM   CARE MANAGEMENT NOTE 09/15/2012  Patient:  Michelle Miranda, Michelle Miranda   Account Number:  [id number]  Date Initiated:  09/15/2012  Documentation initiated by:  Colleen Can  Subjective/Objective Assessment:   dx total left knee replacemnt     Action/Plan:   SNF rehab   Anticipated DC Date:  09/15/2012   Anticipated DC Plan:  SKILLED NURSING FACILITY  In-house referral  Clinical Social Worker      DC Planning Services  CM consult      Choice offered to / List presented to:             Status of service:  Completed, signed off Medicare Important Message given?   (If response is "NO", the following Medicare IM given date fields will be blank) Date Medicare IM given:   Date Additional Medicare IM given:    Discharge Disposition:  SKILLED NURSING FACILITY  Per UR Regulation:  Reviewed for med. necessity/level of care/duration of stay  If discussed at Long Length of Stay Meetings, dates discussed:    Comments:

## 2012-10-13 ENCOUNTER — Other Ambulatory Visit: Payer: Self-pay | Admitting: Internal Medicine

## 2012-10-13 ENCOUNTER — Ambulatory Visit
Admission: RE | Admit: 2012-10-13 | Discharge: 2012-10-13 | Disposition: A | Discharge status: Home / Self Care | Payer: BC Managed Care – PPO | Source: Ambulatory Visit | Attending: Internal Medicine | Admitting: Internal Medicine

## 2012-10-13 DIAGNOSIS — R7989 Other specified abnormal findings of blood chemistry: Secondary | ICD-10-CM

## 2012-10-13 DIAGNOSIS — R0602 Shortness of breath: Secondary | ICD-10-CM

## 2012-10-13 MED ORDER — IOHEXOL 350 MG/ML SOLN
125.0000 mL | Freq: Once | INTRAVENOUS | Status: AC | PRN
  Administered 2012-10-13: 125 mL via INTRAVENOUS

## 2013-05-10 ENCOUNTER — Other Ambulatory Visit (HOSPITAL_COMMUNITY)
Admission: RE | Admit: 2013-05-10 | Discharge: 2013-05-10 | Disposition: A | Discharge status: Home / Self Care | Payer: BC Managed Care – PPO | Source: Ambulatory Visit | Attending: Internal Medicine | Admitting: Internal Medicine

## 2013-05-10 ENCOUNTER — Other Ambulatory Visit: Payer: Self-pay | Admitting: Emergency Medicine

## 2013-05-10 DIAGNOSIS — Z01419 Encounter for gynecological examination (general) (routine) without abnormal findings: Secondary | ICD-10-CM | POA: Insufficient documentation

## 2013-05-10 DIAGNOSIS — Z1151 Encounter for screening for human papillomavirus (HPV): Secondary | ICD-10-CM | POA: Insufficient documentation

## 2013-06-18 ENCOUNTER — Other Ambulatory Visit: Payer: Self-pay | Admitting: Internal Medicine

## 2013-06-18 MED ORDER — LEVOTHYROXINE SODIUM 25 MCG PO TABS: 25.0000 ug | ORAL_TABLET | Freq: Every morning | ORAL | Status: DC

## 2013-07-29 ENCOUNTER — Other Ambulatory Visit: Payer: Self-pay | Admitting: Internal Medicine

## 2013-07-31 ENCOUNTER — Other Ambulatory Visit: Payer: Self-pay | Admitting: Physician Assistant

## 2013-12-31 ENCOUNTER — Other Ambulatory Visit: Payer: Self-pay | Admitting: Internal Medicine

## 2014-02-09 ENCOUNTER — Other Ambulatory Visit: Payer: Self-pay | Admitting: Physician Assistant

## 2014-05-09 DIAGNOSIS — E559 Vitamin D deficiency, unspecified: Secondary | ICD-10-CM | POA: Insufficient documentation

## 2014-05-09 DIAGNOSIS — E063 Autoimmune thyroiditis: Secondary | ICD-10-CM

## 2014-05-09 DIAGNOSIS — F325 Major depressive disorder, single episode, in full remission: Secondary | ICD-10-CM | POA: Insufficient documentation

## 2014-05-09 DIAGNOSIS — E038 Other specified hypothyroidism: Secondary | ICD-10-CM | POA: Insufficient documentation

## 2014-05-09 DIAGNOSIS — I10 Essential (primary) hypertension: Secondary | ICD-10-CM | POA: Insufficient documentation

## 2014-05-09 DIAGNOSIS — E785 Hyperlipidemia, unspecified: Secondary | ICD-10-CM | POA: Insufficient documentation

## 2014-05-10 ENCOUNTER — Ambulatory Visit (INDEPENDENT_AMBULATORY_CARE_PROVIDER_SITE_OTHER): Payer: BC Managed Care – PPO | Admitting: Physician Assistant

## 2014-05-10 ENCOUNTER — Encounter: Payer: Self-pay | Admitting: Physician Assistant

## 2014-05-10 VITALS — BP 128/78 | HR 72 | Temp 97.7°F | Resp 16 | Ht 61.0 in | Wt 180.0 lb

## 2014-05-10 DIAGNOSIS — R7303 Prediabetes: Secondary | ICD-10-CM

## 2014-05-10 DIAGNOSIS — E785 Hyperlipidemia, unspecified: Secondary | ICD-10-CM

## 2014-05-10 DIAGNOSIS — E559 Vitamin D deficiency, unspecified: Secondary | ICD-10-CM

## 2014-05-10 DIAGNOSIS — I10 Essential (primary) hypertension: Secondary | ICD-10-CM

## 2014-05-10 DIAGNOSIS — Z1159 Encounter for screening for other viral diseases: Secondary | ICD-10-CM

## 2014-05-10 DIAGNOSIS — Z0001 Encounter for general adult medical examination with abnormal findings: Secondary | ICD-10-CM

## 2014-05-10 DIAGNOSIS — R6889 Other general symptoms and signs: Secondary | ICD-10-CM

## 2014-05-10 DIAGNOSIS — Z23 Encounter for immunization: Secondary | ICD-10-CM

## 2014-05-10 DIAGNOSIS — F32A Depression, unspecified: Secondary | ICD-10-CM

## 2014-05-10 DIAGNOSIS — E038 Other specified hypothyroidism: Secondary | ICD-10-CM

## 2014-05-10 DIAGNOSIS — E669 Obesity, unspecified: Secondary | ICD-10-CM

## 2014-05-10 DIAGNOSIS — F329 Major depressive disorder, single episode, unspecified: Secondary | ICD-10-CM

## 2014-05-10 LAB — CBC WITH DIFFERENTIAL/PLATELET
BASOS ABS: 0.1 10*3/uL (ref 0.0–0.1)
BASOS PCT: 1 % (ref 0–1)
Eosinophils Absolute: 0.4 10*3/uL (ref 0.0–0.7)
Eosinophils Relative: 5 % (ref 0–5)
HCT: 41.1 % (ref 36.0–46.0)
Hemoglobin: 14 g/dL (ref 12.0–15.0)
LYMPHS PCT: 30 % (ref 12–46)
Lymphs Abs: 2.4 10*3/uL (ref 0.7–4.0)
MCH: 31 pg (ref 26.0–34.0)
MCHC: 34.1 g/dL (ref 30.0–36.0)
MCV: 90.9 fL (ref 78.0–100.0)
Monocytes Absolute: 1 10*3/uL (ref 0.1–1.0)
Monocytes Relative: 12 % (ref 3–12)
NEUTROS ABS: 4.2 10*3/uL (ref 1.7–7.7)
NEUTROS PCT: 52 % (ref 43–77)
Platelets: 342 10*3/uL (ref 150–400)
RBC: 4.52 MIL/uL (ref 3.87–5.11)
RDW: 13.6 % (ref 11.5–15.5)
WBC: 8.1 10*3/uL (ref 4.0–10.5)

## 2014-05-10 MED ORDER — ERYTHROMYCIN 2 % EX GEL: Freq: Two times a day (BID) | CUTANEOUS | Status: DC

## 2014-05-10 NOTE — Progress Notes (Signed)
Complete Physical  Assessment and Plan: Osteopenia- DEXA next year, continue Ca and Vitamin D, weight bearing exercises.  Urinary tract infection- check urine  Hyperlipidemia- -continue medications, check lipids, decrease fatty foods, increase activity.   Hypertension-- continue medications, DASH diet, exercise and monitor at home. Call if greater than 130/80.   Arthritis- continue meds  Hypothyroid- will stop generic and try brand name synthryoid samples- recheck 1 month  Depression- controlled/remission with cymbalta  Vitamin D deficiency- continue supplement  Obesity with co morbidities- long discussion about weight loss, diet, and exercise Prediabetes Discussed general issues about diabetes pathophysiology and management., Educational material distributed., Suggested low cholesterol diet., Encouraged aerobic exercise., Discussed foot care., Reminded to get yearly retinal exam. FBD- get 3D MGM PAP 3 years ? Need for zoster- get titer   Discussed med's effects and SE's. Screening labs and tests as requested with regular follow-up as recommended.  HPI 63 y.o. female  presents for a complete physical.  Her blood pressure has been controlled at home, today their BP is BP: 128/78 mmHg She does not workout. She denies chest pain, shortness of breath, dizziness.  She is not on cholesterol medication and denies myalgias. Her cholesterol is at goal. The cholesterol last visit was:  LDL 87 She has not been working on diet and exercise for prediabetes, and denies polydipsia, polyuria and visual disturbances. Last A1C in the office was: 5.9 Patient is on Vitamin D supplement.   She had possible shingles on left side in June, [facility], she would like a zoster titer to see if she would benefit from the shingles vaccine. It was a very mild case.  She is on thyroid medication. Her medication was changed last visit, she is on 25 mcg daily of the generic levothyroxine, she was unable to  tolerate the 49mcg 2 days a week. Patient denies nervousness and palpitations. Her last TSH was 3.645.  + vaginal dryness but albolene helps.  BMI is Body mass index is 34.03 kg/(m^2)., she is struggling with weight loss due to stress, time limitations. Wt Readings from Last 3 Encounters:  05/10/14 180 lb (81.647 kg)  09/11/12 170 lb (77.111 kg)  09/11/12 170 lb (77.111 kg)    Current Medications:  Current Outpatient Prescriptions on File Prior to Visit  Medication Sig Dispense Refill  . DULoxetine (CYMBALTA) 30 MG capsule TAKE ONE CAPSULE BY MOUTH EVERY DAY  30 capsule  4  . levothyroxine (SYNTHROID, LEVOTHROID) 25 MCG tablet TAKE 1 TABLET (25 MCG TOTAL) BY MOUTH EVERY MORNING.  90 tablet  1   No current facility-administered medications on file prior to visit.   Health Maintenance:   Immunization History  Administered Date(s) Administered  . Td 06/02/2006   Tetanus: 2007 Pneumovax: Flu vaccine: TODAY Zostavax: check titer then will give shot if needed Pap: 2014 due 3 years MGM: 10/2012 DEXA: 2014 + osteopenia due 2016 Colonoscopy: 2007 due 2017 EGD:  [name], [name] Gearlean Alf, MD as Consulting Physician (Orthopedic Surgery) Linton Rump, MD as Consulting Physician (Ophthalmology) Mayme Genta, MD as Consulting Physician (Gastroenterology)   Allergies:  Allergies  Allergen Reactions  . Oxycodone     "Found Oxycodone not effective for pain control" with last RTKA   Medical History:  Past Medical History  Diagnosis Date  . Osteopenia   . Urinary tract infection 12-15-2011  . Hyperlipidemia   . Hypertension   . Arthritis   .  Hypothyroid   . Depression   . Vitamin D deficiency    Surgical History:  Past Surgical History  Procedure Laterality Date  . C sections  jan 1980 and march 1984  . Ovarian cyst with lararoscopy march 1998    . Total knee arthroplasty  01/03/2012    Procedure: TOTAL  KNEE ARTHROPLASTY;  Surgeon: Gearlean Alf, MD;  Location: [facility];  Service: Orthopedics;  Laterality: Right;  . Wisdom tooth extraction  09-05-12    extractions  . Total knee arthroplasty Left 09/11/2012    Procedure: Left Total Knee Arthroplasty;  Surgeon: Gearlean Alf, MD;  Location: [facility];  Service: Orthopedics;  Laterality: Left;   Family History:  Family History  Problem Relation Age of Onset  . Hypertension Mother   . Thyroid disease Mother   . Arthritis Mother   . Cancer Mother   . COPD Father    Social History:  History  Substance Use Topics  . Smoking status: Never Smoker   . Smokeless tobacco: Never Used  . Alcohol Use: Yes     Comment: 1-2 glass wine night    Review of Systems: [X]  = complains of  [ ]  = denies  General: Fatigue [ ]  Fever [ ]  Chills [ ]  Weakness [ ]   Insomnia [ ] Weight change [ ]  Night sweats [ ]   Change in appetite [ ]  Eyes: Redness [ ]  Blurred vision [ ]  Diplopia [ ]  Discharge [ ]   ENT: Congestion [ ]  Sinus Pain [ ]  Post Nasal Drip [ ]  Sore Throat [ ]  Earache [ ]  hearing loss [ ]  Tinnitus [ ]  Snoring per husband [x ]  Cardiac: Chest pain/pressure [ ]  SOB [ ]  Orthopnea [ ]   Palpitations [ ]   Paroxysmal nocturnal dyspnea[ ]  Claudication [ ]  Edema [ ]   Pulmonary: Cough [ ]  Wheezing[ ]   SOB [ ]   Pleurisy [ ]   GI: Nausea [ ]  Vomiting[ ]  Dysphagia[ ]  Heartburn[ ]  Abdominal pain [ ]  Constipation [ ] ; Diarrhea [ ]  BRBPR [ ]  Melena[ ]  Bloating [ ]  Hemorrhoids [ ]   GU: Hematuria[ ]  Dysuria [ ]  Nocturia[ ]  Urgency [ ]   Hesitancy [ ]  Discharge [ ]  Frequency [ ]   Breast:  Breast lumps [ ]   nipple discharge [ ]    Neuro: Headaches[ ]  Vertigo[ ]  Paresthesias[ ]  Spasm [ ]  Speech changes [ ]  Incoordination [ ]   Ortho: Arthritis [ ]  Joint pain [ ]  Muscle pain [ ]  Joint swelling [ ]  Back Pain [ ]  Skin:  Rash bilateral nares [x ]  Pruritis [ ]  Change in skin lesion [ ]   Psych: Depression[ ]  Anxiety[ ]  Confusion [ ]  Memory loss [ ]   Heme/Lypmh: Bleeding [ ]  Bruising [  ] Enlarged lymph nodes [ ]   Endocrine: Visual blurring [ ]  Paresthesia [ ]  Polyuria [ ]  Polydypsea [ ]    Heat/cold intolerance [ ]  Hypoglycemia [ ]   Physical Exam: Estimated body mass index is 34.03 kg/(m^2) as calculated from the following:   Height as of this encounter: 5\' 1"  (1.549 m).   Weight as of this encounter: 180 lb (81.647 kg). BP 128/78  Pulse 72  Temp(Src) 97.7 F (36.5 C)  Resp 16  Ht 5\' 1"  (1.549 m)  Wt 180 lb (81.647 kg)  BMI 34.03 kg/m2 General Appearance: Well nourished, in no apparent distress. Eyes: PERRLA, EOMs, conjunctiva no swelling or erythema, normal fundi and vessels. Sinuses: No Frontal/maxillary tenderness ENT/Mouth: Ext aud canals clear, normal light  reflex with TMs without erythema, bulging.  Good dentition. No erythema, swelling, or exudate on post pharynx. Tonsils not swollen or erythematous. Hearing normal.  Neck: Supple, thyroid normal. No bruits Respiratory: Respiratory effort normal, BS equal bilaterally without rales, rhonchi, wheezing or stridor. Cardio: RRR without murmurs, rubs or gallops. Brisk peripheral pulses without edema.  Chest: symmetric, with normal excursions and percussion. Breasts: Symmetric, with very dense breast, without nipple discharge, retractions. Abdomen: Soft, +BS. Non tender, no guarding, rebound, hernias, masses, or organomegaly. .  Lymphatics: Non tender without lymphadenopathy.  Genitourinary: defer Musculoskeletal: Full ROM all peripheral extremities,5/5 strength, and normal gait. Skin: Warm, dry without rashes, lesions, ecchymosis.  Neuro: Cranial nerves intact, reflexes equal bilaterally. Normal muscle tone, no cerebellar symptoms. Sensation intact.  Psych: Awake and oriented X 3, normal affect, Insight and Judgment appropriate.   EKG: WNL no changes. AORTA SCAN: WNL    Vicie Mutters 10:07 AM

## 2014-05-10 NOTE — Patient Instructions (Addendum)
For nose- try hydrocortisone cream at first and if this does not help can try antibiotic ointment, and if this does not help, and if this does not help then get the erytheromycin gel filled.   Why we do not prescribe Armour Thyroid: 1) Armour is purified porcine (pig) thyroid glands, which is NOT without risk for contaminants.  2) The ratio between T3 and T4 in Armour thyroid is physiologic for pigs, NOT for humans.  3) The short half life of T3 can cause fluctuations in blood levels, which can result in mood swings and heart rhythm abnormalities.  4) the concentration of the active substances (T4 and T3) can be expected to vary between different Armour lots, which can cause variation in the thyroid function tests.   Benefiber is good for constipation/diarrhea/irritable bowel syndrome, it helps with weight loss and can help lower your bad cholesterol. Please do 1-2 TBSP in the morning in water, coffee, or tea. It can take up to a month before you can see a difference with your bowel movements. It is cheapest from [employer], [employer], [employer].   Get the 3D MGM due to dense breast    Bad carbs also include fruit juice, alcohol, and sweet tea. These are empty calories that do not signal to your brain that you are full.   Please remember the good carbs are still carbs which convert into sugar. So please measure them out no more than 1/2-1 cup of rice, oatmeal, pasta, and beans.  Veggies are however free foods! Pile them on.   I like lean protein at every meal such as chicken, [place], pork chops, cottage cheese, etc. Just do not fry these meats and please center your meal around vegetable, the meats should be a side dish.   No all fruit is created equal. Please see the list below, the fruit at the bottom is higher in sugars than the fruit at the top

## 2014-05-11 LAB — BASIC METABOLIC PANEL WITH GFR
BUN: 14 mg/dL (ref 6–23)
CHLORIDE: 102 meq/L (ref 96–112)
CO2: 26 mEq/L (ref 19–32)
Calcium: 9.3 mg/dL (ref 8.4–10.5)
Creat: 0.79 mg/dL (ref 0.50–1.10)
GFR, EST NON AFRICAN AMERICAN: 81 mL/min
Glucose, Bld: 101 mg/dL — ABNORMAL HIGH (ref 70–99)
POTASSIUM: 4.4 meq/L (ref 3.5–5.3)
Sodium: 142 mEq/L (ref 135–145)

## 2014-05-11 LAB — URINALYSIS, ROUTINE W REFLEX MICROSCOPIC
BILIRUBIN URINE: NEGATIVE
GLUCOSE, UA: NEGATIVE mg/dL
Hgb urine dipstick: NEGATIVE
Ketones, ur: NEGATIVE mg/dL
Leukocytes, UA: NEGATIVE
NITRITE: NEGATIVE
Protein, ur: NEGATIVE mg/dL
SPECIFIC GRAVITY, URINE: 1.023 (ref 1.005–1.030)
Urobilinogen, UA: 0.2 mg/dL (ref 0.0–1.0)
pH: 6 (ref 5.0–8.0)

## 2014-05-11 LAB — LIPID PANEL
Cholesterol: 197 mg/dL (ref 0–200)
HDL: 87 mg/dL (ref >39)
LDL CALC: 98 mg/dL (ref 0–99)
Total CHOL/HDL Ratio: 2.3 Ratio
Triglycerides: 60 mg/dL (ref <150)
VLDL: 12 mg/dL (ref 0–40)

## 2014-05-11 LAB — HEPATIC FUNCTION PANEL
ALT: 18 U/L (ref 0–35)
AST: 22 U/L (ref 0–37)
Albumin: 4.3 g/dL (ref 3.5–5.2)
Alkaline Phosphatase: 84 U/L (ref 39–117)
BILIRUBIN DIRECT: 0.1 mg/dL (ref 0.0–0.3)
BILIRUBIN INDIRECT: 0.3 mg/dL (ref 0.2–1.2)
Total Bilirubin: 0.4 mg/dL (ref 0.2–1.2)
Total Protein: 7.1 g/dL (ref 6.0–8.3)

## 2014-05-11 LAB — MICROALBUMIN / CREATININE URINE RATIO
Creatinine, Urine: 147.5 mg/dL
MICROALB/CREAT RATIO: 3.4 mg/g (ref 0.0–30.0)
Microalb, Ur: 0.5 mg/dL (ref <2.0)

## 2014-05-11 LAB — INSULIN, FASTING: Insulin fasting, serum: 6.3 u[IU]/mL (ref 2.0–19.6)

## 2014-05-11 LAB — HEMOGLOBIN A1C
Hgb A1c MFr Bld: 6 % — ABNORMAL HIGH (ref <5.7)
Mean Plasma Glucose: 126 mg/dL — ABNORMAL HIGH (ref <117)

## 2014-05-11 LAB — VITAMIN B12: Vitamin B-12: 436 pg/mL (ref 211–911)

## 2014-05-11 LAB — MAGNESIUM: MAGNESIUM: 2.1 mg/dL (ref 1.5–2.5)

## 2014-05-11 LAB — IRON AND TIBC
%SAT: 19 % — AB (ref 20–55)
Iron: 73 ug/dL (ref 42–145)
TIBC: 392 ug/dL (ref 250–470)
UIBC: 319 ug/dL (ref 125–400)

## 2014-05-11 LAB — VITAMIN D 25 HYDROXY (VIT D DEFICIENCY, FRACTURES): VIT D 25 HYDROXY: 46 ng/mL (ref 30–89)

## 2014-05-11 LAB — TSH: TSH: 4.467 u[IU]/mL (ref 0.350–4.500)

## 2014-05-11 LAB — VARICELLA ZOSTER ANTIBODY, IGG: Varicella IgG: >4000 Index — ABNORMAL HIGH (ref <135.00)

## 2014-05-11 LAB — FERRITIN: Ferritin: 116 ng/mL (ref 10–291)

## 2014-06-10 ENCOUNTER — Other Ambulatory Visit: Payer: BC Managed Care – PPO

## 2014-06-10 DIAGNOSIS — I10 Essential (primary) hypertension: Secondary | ICD-10-CM

## 2014-06-10 DIAGNOSIS — E039 Hypothyroidism, unspecified: Secondary | ICD-10-CM

## 2014-06-10 DIAGNOSIS — D509 Iron deficiency anemia, unspecified: Secondary | ICD-10-CM

## 2014-06-10 LAB — CBC WITH DIFFERENTIAL/PLATELET
Basophils Absolute: 0.1 10*3/uL (ref 0.0–0.1)
Basophils Relative: 1 % (ref 0–1)
EOS PCT: 4 % (ref 0–5)
Eosinophils Absolute: 0.3 10*3/uL (ref 0.0–0.7)
HCT: 42 % (ref 36.0–46.0)
Hemoglobin: 14.1 g/dL (ref 12.0–15.0)
LYMPHS ABS: 2.5 10*3/uL (ref 0.7–4.0)
Lymphocytes Relative: 32 % (ref 12–46)
MCH: 30.6 pg (ref 26.0–34.0)
MCHC: 33.6 g/dL (ref 30.0–36.0)
MCV: 91.1 fL (ref 78.0–100.0)
Monocytes Absolute: 0.9 10*3/uL (ref 0.1–1.0)
Monocytes Relative: 11 % (ref 3–12)
NEUTROS PCT: 52 % (ref 43–77)
Neutro Abs: 4.1 10*3/uL (ref 1.7–7.7)
PLATELETS: 361 10*3/uL (ref 150–400)
RBC: 4.61 MIL/uL (ref 3.87–5.11)
RDW: 13.5 % (ref 11.5–15.5)
WBC: 7.8 10*3/uL (ref 4.0–10.5)

## 2014-06-10 LAB — IRON AND TIBC
%SAT: 29 % (ref 20–55)
Iron: 118 ug/dL (ref 42–145)
TIBC: 403 ug/dL (ref 250–470)
UIBC: 285 ug/dL (ref 125–400)

## 2014-06-10 LAB — TSH: TSH: 3.618 u[IU]/mL (ref 0.350–4.500)

## 2014-06-10 LAB — FERRITIN: Ferritin: 112 ng/mL (ref 10–291)

## 2014-06-13 ENCOUNTER — Other Ambulatory Visit: Payer: Self-pay | Admitting: Internal Medicine

## 2014-06-13 ENCOUNTER — Other Ambulatory Visit: Payer: Self-pay

## 2014-06-13 MED ORDER — LEVOTHYROXINE SODIUM 25 MCG PO TABS: ORAL_TABLET | ORAL | Status: DC

## 2014-08-02 HISTORY — PX: CATARACT EXTRACTION, BILATERAL: SHX1313

## 2014-08-10 ENCOUNTER — Other Ambulatory Visit: Payer: Self-pay | Admitting: Physician Assistant

## 2014-08-15 ENCOUNTER — Other Ambulatory Visit: Payer: Self-pay | Admitting: Physician Assistant

## 2014-08-19 ENCOUNTER — Ambulatory Visit: Payer: Self-pay | Admitting: Physician Assistant

## 2014-09-03 ENCOUNTER — Ambulatory Visit: Payer: Self-pay | Admitting: Physician Assistant

## 2014-11-01 ENCOUNTER — Ambulatory Visit: Payer: Self-pay | Admitting: Physician Assistant

## 2014-12-24 ENCOUNTER — Ambulatory Visit: Payer: Self-pay | Admitting: Physician Assistant

## 2014-12-30 ENCOUNTER — Other Ambulatory Visit: Payer: Self-pay | Admitting: Internal Medicine

## 2015-01-01 ENCOUNTER — Ambulatory Visit (INDEPENDENT_AMBULATORY_CARE_PROVIDER_SITE_OTHER): Payer: Managed Care, Other (non HMO) | Admitting: Physician Assistant

## 2015-01-01 ENCOUNTER — Encounter: Payer: Self-pay | Admitting: Physician Assistant

## 2015-01-01 VITALS — BP 128/78 | HR 76 | Temp 98.1°F | Resp 16 | Wt 177.0 lb

## 2015-01-01 DIAGNOSIS — R7309 Other abnormal glucose: Secondary | ICD-10-CM

## 2015-01-01 DIAGNOSIS — R7303 Prediabetes: Secondary | ICD-10-CM

## 2015-01-01 DIAGNOSIS — E669 Obesity, unspecified: Secondary | ICD-10-CM

## 2015-01-01 DIAGNOSIS — Z79899 Other long term (current) drug therapy: Secondary | ICD-10-CM

## 2015-01-01 DIAGNOSIS — I10 Essential (primary) hypertension: Secondary | ICD-10-CM

## 2015-01-01 DIAGNOSIS — E038 Other specified hypothyroidism: Secondary | ICD-10-CM

## 2015-01-01 DIAGNOSIS — E559 Vitamin D deficiency, unspecified: Secondary | ICD-10-CM

## 2015-01-01 DIAGNOSIS — E785 Hyperlipidemia, unspecified: Secondary | ICD-10-CM

## 2015-01-01 MED ORDER — METRONIDAZOLE 0.75 % EX CREA: TOPICAL_CREAM | Freq: Two times a day (BID) | CUTANEOUS | Status: DC

## 2015-01-01 NOTE — Patient Instructions (Signed)
Before you even begin to attack a weight-loss plan, it pays to remember this: You are not fat. You have fat. Losing weight isn't about blame or shame; it's simply another achievement to accomplish. Dieting is like any other skill-you have to buckle down and work at it. As long as you act in a smart, reasonable way, you'll ultimately get where you want to be. Here are some weight loss pearls for you.  1. It's Not a Diet. It's a Lifestyle Thinking of a diet as something you're on and suffering through only for the short term doesn't work. To shed weight and keep it off, you need to make permanent changes to the way you eat. It's OK to indulge occasionally, of course, but if you cut calories temporarily and then revert to your old way of eating, you'll gain back the weight quicker than you can say yo-yo. Use it to lose it. Research shows that one of the best predictors of long-term weight loss is how many pounds you drop in the first month. For that reason, nutritionists often suggest being stricter for the first two weeks of your new eating strategy to build momentum. Cut out added sugar and alcohol and avoid unrefined carbs. After that, figure out how you can reincorporate them in a way that's healthy and maintainable.  2. There's a Right Way to Exercise Working out burns calories and fat and boosts your metabolism by building muscle. But those trying to lose weight are notorious for overestimating the number of calories they burn and underestimating the amount they take in. Unfortunately, your system is biologically programmed to hold on to extra pounds and that means when you start exercising, your body senses the deficit and ramps up its hunger signals. If you're not diligent, you'll eat everything you burn and then some. Use it to lose it. Cardio gets all the exercise glory, but strength and interval training are the real heroes. They help you build lean muscle, which in turn increases your metabolism and  calorie-burning ability 3. Don't Overreact to Mild Hunger Some people have a hard time losing weight because of hunger anxiety. To them, being hungry is bad-something to be avoided at all costs-so they carry snacks with them and eat when they don't need to. Others eat because they're stressed out or bored. While you never want to get to the point of being ravenous (that's when bingeing is likely to happen), a hunger pang, a craving, or the fact that it's 3:00 p.m. should not send you racing for the vending machine or obsessing about the energy bar in your purse. Ideally, you should put off eating until your stomach is growling and it's difficult to concentrate.  Use it to lose it. When you feel the urge to eat, use the HALT method. Ask yourself, Am I really hungry? Or am I angry or anxious, lonely or bored, or tired? If you're still not certain, try the apple test. If you're truly hungry, an apple should seem delicious; if it doesn't, something else is going on. Or you can try drinking water and making yourself busy, if you are still hungry try a healthy snack.  4. Not All Calories Are Created Equal The mechanics of weight loss are pretty simple: Take in fewer calories than you use for energy. But the kind of food you eat makes all the difference. Processed food that's high in saturated fat and refined starch or sugar can cause inflammation that disrupts the hormone signals that tell   your brain you're full. The result: You eat a lot more.  Use it to lose it. Clean up your diet. Swap in whole, unprocessed foods, including vegetables, lean protein, and healthy fats that will fill you up and give you the biggest nutritional bang for your calorie buck. In a few weeks, as your brain starts receiving regular hunger and fullness signals once again, you'll notice that you feel less hungry overall and naturally start cutting back on the amount you eat.  5. Protein, Produce, and Plant-Based Fats Are Your Weight-Loss  [place] Here's why eating the three Ps regularly will help you drop pounds. Protein fills you up. You need it to build lean muscle, which keeps your metabolism humming so that you can torch more fat. People in a weight-loss program who ate double the recommended daily allowance for protein (about 110 grams for a 150-pound woman) lost 70 percent of their weight from fat, while people who ate the RDA lost only about 40 percent, one study found. Produce is packed with filling fiber. "It's very difficult to consume too many calories if you're eating a lot of vegetables. Example: Three cups of broccoli is a lot of food, yet only 93 calories. (Fruit is another story. It can be easy to overeat and can contain a lot of calories from sugar, so be sure to monitor your intake.) Plant-based fats like olive oil and those in avocados and nuts are healthy and extra satiating.  Use it to lose it. Aim to incorporate each of the three Ps into every meal and snack. People who eat protein throughout the day are able to keep weight off, according to a study in the American Journal of Clinical Nutrition. In addition to meat, poultry and seafood, good sources are beans, lentils, eggs, tofu, and yogurt. As for fat, keep portion sizes in check by measuring out salad dressing, oil, and nut butters (shoot for one to two tablespoons). Finally, eat veggies or a little fruit at every meal. People who did that consumed 308 fewer calories but didn't feel any hungrier than when they didn't eat more produce.  7. How You Eat Is As Important As What You Eat In order for your brain to register that you're full, you need to focus on what you're eating. Sit down whenever you eat, preferably at a table. Turn off the TV or computer, put down your phone, and look at your food. Smell it. Chew slowly, and don't put another bite on your fork until you swallow. When women ate lunch this attentively, they consumed 30 percent less when snacking later than  those who listened to an audiobook at lunchtime, according to a study in the British Journal of Nutrition. 8. Weighing Yourself Really Works The scale provides the best evidence about whether your efforts are paying off. Seeing the numbers tick up or down or stagnate is motivation to keep going-or to rethink your approach. A 2015 study at [employer] found that daily weigh-ins helped people lose more weight, keep it off, and maintain that loss, even after two years. Use it to lose it. Step on the scale at the same time every day for the best results. If your weight shoots up several pounds from one weigh-in to the next, don't freak out. Eating a lot of salt the night before or having your period is the likely culprit. The number should return to normal in a day or two. It's a steady climb that you need to do something about.   9. Too Much Stress and Too Little Sleep Are Your Enemies When you're tired and frazzled, your body cranks up the production of cortisol, the stress hormone that can cause carb cravings. Not getting enough sleep also boosts your levels of ghrelin, a hormone associated with hunger, while suppressing leptin, a hormone that signals fullness and satiety. People on a diet who slept only five and a half hours a night for two weeks lost 55 percent less fat and were hungrier than those who slept eight and a half hours, according to a study in the [employer] Journal. Use it to lose it. Prioritize sleep, aiming for seven hours or more a night, which research shows helps lower stress. And make sure you're getting quality zzz's. If a snoring spouse or a fidgety cat wakes you up frequently throughout the night, you may end up getting the equivalent of just four hours of sleep, according to a study from [employer]. Keep pets out of the bedroom, and use a white-noise app to drown out snoring. 10. You Will Hit a plateau-And You Can Bust Through It As you slim down, your  body releases much less leptin, the fullness hormone.  If you're not strength training, start right now. Building muscle can raise your metabolism to help you overcome a plateau. To keep your body challenged and burning calories, incorporate new moves and more intense intervals into your workouts or add another sweat session to your weekly routine. Alternatively, cut an extra 100 calories or so a day from your diet. Now that you've lost weight, your body simply doesn't need as much fuel.   Ways to cut 100 calories  1. Eat your eggs with hot sauce OR salsa instead of cheese.  Eggs are great for breakfast, but many people consider eggs and cheese to be BFFs. Instead of cheese-1 oz. of cheddar has 114 calories-top your eggs with hot sauce, which contains no calories and helps with satiety and metabolism. Salsa is also a great option!!  2. Top your toast, waffles or pancakes with mashed berries instead of jelly or syrup. Half a cup of berries-fresh, frozen or thawed-has about 40 calories, compared with 2 tbsp. of maple syrup or jelly, which both have about 100 calories. The berries will also give you a good punch of fiber, which helps keep you full and satisfied and won't spike blood sugar quickly like the jelly or syrup. 3. Swap the non-fat latte for black coffee with a splash of half-and-half. Contrary to its name, that non-fat latte has 130 calories and a startling 19g of carbohydrates per 16 oz. serving. Replacing that 'light' drinkable dessert with a black coffee with a splash of half-and-half saves you more than 100 calories per 16 oz. serving. 4. Sprinkle salads with freeze-dried raspberries instead of dried cranberries. If you want a sweet addition to your nutritious salad, stay away from dried cranberries. They have a whopping 130 calories per  cup and 30g carbohydrates. Instead, sprinkle freeze-dried raspberries guilt-free and save more than 100 calories per  cup serving, adding 3g of belly-filling  fiber. 5. Go for mustard in place of mayo on your sandwich. Mustard can add really nice flavor to any sandwich, and there are tons of varieties, from spicy to honey. A serving of mayo is 95 calories, versus 10 calories in a serving of mustard. 6. Choose a DIY salad dressing instead of the store-bought kind. Mix Dijon or whole grain mustard with low-fat Kefir or red wine vinegar   and garlic. 7. Use hummus as a spread instead of a dip. Use hummus as a spread on a high-fiber cracker or tortilla with a sandwich and save on calories without sacrificing taste. 8. Pick just one salad "accessory." Salad isn't automatically a calorie winner. It's easy to over-accessorize with toppings. Instead of topping your salad with nuts, avocado and cranberries (all three will clock in at 313 calories), just pick one. The next day, choose a different accessory, which will also keep your salad interesting. You don't wear all your jewelry every day, right? 9. Ditch the white pasta in favor of spaghetti squash. One cup of cooked spaghetti squash has about 40 calories, compared with traditional spaghetti, which comes with more than 200. Spaghetti squash is also nutrient-dense. It's a good source of fiber and Vitamins A and C, and it can be eaten just like you would eat pasta-with a great tomato sauce and turkey meatballs or with pesto, tofu and spinach, for example. 10. Dress up your chili, soups and stews with non-fat Greek yogurt instead of sour cream. Just a 'dollop' of sour cream can set you back 115 calories and a whopping 12g of fat-seven of which are of the artery-clogging variety. Added bonus: Greek yogurt is packed with muscle-building protein, calcium and B Vitamins. 11. Mash cauliflower instead of mashed potatoes. One cup of traditional mashed potatoes-in all their creamy goodness-has more than 200 calories, compared to mashed cauliflower, which you can typically eat for less than 100 calories per 1 cup serving.  Cauliflower is a great source of the antioxidant indole-3-carbinol (I3C), which may help reduce the risk of some cancers, like breast cancer. 12. Ditch the ice cream sundae in favor of a Greek yogurt parfait. Instead of a cup of ice cream or fro-yo for dessert, try 1 cup of nonfat Greek yogurt topped with fresh berries and a sprinkle of cacao nibs. Both toppings are packed with antioxidants, which can help reduce cellular inflammation and oxidative damage. And the comparison is a no-brainer: One cup of ice cream has about 275 calories; one cup of frozen yogurt has about 230; and a cup of Greek yogurt has just 130, plus twice the protein, so you're less likely to return to the freezer for a second helping. 13. Put olive oil in a spray container instead of using it directly from the bottle. Each tablespoon of olive oil is 120 calories and 15g of fat. Use a mister instead of pouring it straight into the pan or onto a salad. This allows for portion control and will save you more than 100 calories. 14. When baking, substitute canned pumpkin for butter or oil. Canned pumpkin-not pumpkin pie mix-is loaded with Vitamin A, which is important for skin and eye health, as well as immunity. And the comparisons are pretty crazy:  cup of canned pumpkin has about 40 calories, compared to butter or oil, which has more than 800 calories. Yes, 800 calories. Applesauce and mashed banana can also serve as good substitutions for butter or oil, usually in a 1:1 ratio. 15. Top casseroles with high-fiber cereal instead of breadcrumbs. Breadcrumbs are typically made with white bread, while breakfast cereals contain 5-9g of fiber per serving. Not only will you save more than 150 calories per  cup serving, the swap will also keep you more full and you'll get a metabolism boost from the added fiber. 16. Snack on pistachios instead of macadamia nuts. Believe it or not, you get the same amount of calories from 35   pistachios (100  calories) as you would from only five macadamia nuts. 17. Chow down on kale chips rather than potato chips. This is my favorite 'don't knock it 'till you try it' swap. Kale chips are so easy to make at home, and you can spice them up with a little grated parmesan or chili powder. Plus, they're a mere fraction of the calories of potato chips, but with the same crunch factor we crave so often. 18. Add seltzer and some fruit slices to your cocktail instead of soda or fruit juice. One cup of soda or fruit juice can pack on as much as 140 calories. Instead, use seltzer and fruit slices. The fruit provides valuable phytochemicals, such as flavonoids and anthocyanins, which help to combat cancer and stave off the aging process.  

## 2015-01-01 NOTE — Progress Notes (Signed)
Assessment and Plan:  1. Hypertension -Continue medication, monitor blood pressure at home. Continue DASH diet.  Reminder to go to the ER if any CP, SOB, nausea, dizziness, severe HA, changes vision/speech, left arm numbness and tingling and jaw pain.  2. Cholesterol -Continue diet and exercise. Check cholesterol.   3. Prediabetes  -Continue diet and exercise. Check A1C  4. Vitamin D Def - check level and continue medications.   5. Hypothyroidism -check TSH level, continue medications the same, reminded to take on an empty stomach 30-26mins before food.  Continue brand name and may try 1/2 of 29mcg  6. Obesity with co morbidities - long discussion about weight loss, diet, and exercise On a nutrimost  Continue diet and meds as discussed. Further disposition pending results of labs. Over 30 minutes of exam, counseling, chart review, and critical decision making was performed  HPI 65 y.o. female  presents for 6 month follow up on hypertension, cholesterol, prediabetes, and vitamin D deficiency.   Her blood pressure has been controlled at home, today their BP is BP: 128/78 mmHg  She does not workout. She denies chest pain, shortness of breath, dizziness.  She is not on cholesterol medication and denies myalgias. Her cholesterol is at goal. The cholesterol last visit was:   Lab Results  Component Value Date   CHOL 197 05/10/2014   HDL 87 05/10/2014   LDLCALC 98 05/10/2014   TRIG 60 05/10/2014   CHOLHDL 2.3 05/10/2014    She has been working on diet and exercise for prediabetes, and denies paresthesia of the feet, polydipsia, polyuria and visual disturbances. Last A1C in the office was:  Lab Results  Component Value Date   HGBA1C 6.0* 05/10/2014   Patient is on Vitamin D supplement.   Lab Results  Component Value Date   VD25OH 46 05/10/2014     She is on thyroid medication, she was switched to brand name last visit and can not tell a difference, if higher doses of thyroid she  will have some palpitations.    Lab Results  Component Value Date   TSH 3.618 06/10/2014  .  BMI is Body mass index is 33.46 kg/(m^2)., she is working on diet and exercise. Wt Readings from Last 3 Encounters:  01/01/15 177 lb (80.287 kg)  05/10/14 180 lb (81.647 kg)  09/11/12 170 lb (77.111 kg)    Current Medications:  Current Outpatient Prescriptions on File Prior to Visit  Medication Sig Dispense Refill  . DULoxetine (CYMBALTA) 30 MG capsule TAKE ONE CAPSULE BY MOUTH EVERY DAY 30 capsule 0  . erythromycin with ethanol (EMGEL) 2 % gel Apply topically 2 (two) times daily. 30 g 2  . levothyroxine (SYNTHROID, LEVOTHROID) 25 MCG tablet TAKE 1 TABLET (25 MCG TOTAL) BY MOUTH EVERY MORNING. 90 tablet 3  . raloxifene (EVISTA) 60 MG tablet TAKE ONE TABLET BY MOUTH EVERY DAY FOR OSTEOPOROSIS 30 tablet 9   No current facility-administered medications on file prior to visit.   Medical History:  Past Medical History  Diagnosis Date  . Osteopenia   . Urinary tract infection 12-15-2011  . Hyperlipidemia   . Hypertension   . Arthritis   . Hypothyroid   . Depression   . Vitamin D deficiency    Allergies:  Allergies  Allergen Reactions  . Oxycodone     "Found Oxycodone not effective for pain control" with last RTKA     Review of Systems:  Review of Systems  Constitutional: Negative.   HENT: Negative.  Eyes: Negative.   Respiratory: Negative.   Cardiovascular: Negative.   Gastrointestinal: Negative.   Genitourinary: Negative.   Musculoskeletal: Negative.   Skin: Positive for rash (bilateral face/under her nose). Negative for itching.  Neurological: Negative.   Endo/Heme/Allergies: Negative.   Psychiatric/Behavioral: Negative.     Family history- Review and unchanged Social history- Review and unchanged Physical Exam: BP 128/78 mmHg  Pulse 76  Temp(Src) 98.1 F (36.7 C)  Resp 16  Wt 177 lb (80.287 kg) Wt Readings from Last 3 Encounters:  01/01/15 177 lb (80.287 kg)   05/10/14 180 lb (81.647 kg)  09/11/12 170 lb (77.111 kg)   General Appearance: Well nourished, in no apparent distress. Eyes: PERRLA, EOMs, conjunctiva no swelling or erythema Sinuses: No Frontal/maxillary tenderness ENT/Mouth: Ext aud canals clear, TMs without erythema, bulging. No erythema, swelling, or exudate on post pharynx.  Tonsils not swollen or erythematous. Hearing normal.  Neck: Supple, thyroid normal.  Respiratory: Respiratory effort normal, BS equal bilaterally without rales, rhonchi, wheezing or stridor.  Cardio: RRR with no MRGs. Brisk peripheral pulses without edema.  Abdomen: Soft, + BS,  Non tender, no guarding, rebound, hernias, masses. Lymphatics: Non tender without lymphadenopathy.  Musculoskeletal: Full ROM, 5/5 strength, Normal gait Skin: Warm, dry without rashes, lesions, ecchymosis.  Neuro: Cranial nerves intact. Normal muscle tone, no cerebellar symptoms. Psych: Awake and oriented X 3, normal affect, Insight and Judgment appropriate.    Vicie Mutters, PA-C 3:35 PM [facility]

## 2015-01-02 LAB — MAGNESIUM: Magnesium: 2 mg/dL (ref 1.5–2.5)

## 2015-01-02 LAB — HEPATIC FUNCTION PANEL
ALK PHOS: 65 U/L (ref 39–117)
ALT: 24 U/L (ref 0–35)
AST: 27 U/L (ref 0–37)
Albumin: 4.3 g/dL (ref 3.5–5.2)
Bilirubin, Direct: 0.1 mg/dL (ref 0.0–0.3)
Indirect Bilirubin: 0.3 mg/dL (ref 0.2–1.2)
Total Bilirubin: 0.4 mg/dL (ref 0.2–1.2)
Total Protein: 7.1 g/dL (ref 6.0–8.3)

## 2015-01-02 LAB — BASIC METABOLIC PANEL WITH GFR
BUN: 15 mg/dL (ref 6–23)
CO2: 22 meq/L (ref 19–32)
Calcium: 9.3 mg/dL (ref 8.4–10.5)
Chloride: 100 mEq/L (ref 96–112)
Creat: 0.73 mg/dL (ref 0.50–1.10)
GFR, EST NON AFRICAN AMERICAN: 89 mL/min
GFR, Est African American: >89 mL/min
Glucose, Bld: 82 mg/dL (ref 70–99)
Potassium: 4.1 mEq/L (ref 3.5–5.3)
Sodium: 136 mEq/L (ref 135–145)

## 2015-01-02 LAB — CBC WITH DIFFERENTIAL/PLATELET
Basophils Absolute: 0 10*3/uL (ref 0.0–0.1)
Basophils Relative: 0 % (ref 0–1)
EOS ABS: 0.2 10*3/uL (ref 0.0–0.7)
Eosinophils Relative: 2 % (ref 0–5)
HEMATOCRIT: 42.3 % (ref 36.0–46.0)
Hemoglobin: 14.2 g/dL (ref 12.0–15.0)
LYMPHS PCT: 24 % (ref 12–46)
Lymphs Abs: 2.2 10*3/uL (ref 0.7–4.0)
MCH: 31 pg (ref 26.0–34.0)
MCHC: 33.6 g/dL (ref 30.0–36.0)
MCV: 92.4 fL (ref 78.0–100.0)
MPV: 9.4 fL (ref 8.6–12.4)
Monocytes Absolute: 0.8 10*3/uL (ref 0.1–1.0)
Monocytes Relative: 9 % (ref 3–12)
Neutro Abs: 5.9 10*3/uL (ref 1.7–7.7)
Neutrophils Relative %: 65 % (ref 43–77)
Platelets: 319 10*3/uL (ref 150–400)
RBC: 4.58 MIL/uL (ref 3.87–5.11)
RDW: 13.3 % (ref 11.5–15.5)
WBC: 9 10*3/uL (ref 4.0–10.5)

## 2015-01-02 LAB — VITAMIN D 25 HYDROXY (VIT D DEFICIENCY, FRACTURES): Vit D, 25-Hydroxy: 30 ng/mL (ref 30–100)

## 2015-01-02 LAB — LIPID PANEL
Cholesterol: 214 mg/dL — ABNORMAL HIGH (ref 0–200)
HDL: 68 mg/dL (ref >=46)
LDL CALC: 131 mg/dL — AB (ref 0–99)
TRIGLYCERIDES: 76 mg/dL (ref <150)
Total CHOL/HDL Ratio: 3.1 Ratio
VLDL: 15 mg/dL (ref 0–40)

## 2015-01-02 LAB — HEMOGLOBIN A1C
HEMOGLOBIN A1C: 5.7 % — AB (ref <5.7)
Mean Plasma Glucose: 117 mg/dL — ABNORMAL HIGH (ref <117)

## 2015-01-02 LAB — TSH: TSH: 4.333 u[IU]/mL (ref 0.350–4.500)

## 2015-01-02 LAB — INSULIN, FASTING: INSULIN FASTING, SERUM: 3.9 u[IU]/mL (ref 2.0–19.6)

## 2015-01-26 ENCOUNTER — Other Ambulatory Visit: Payer: Self-pay | Admitting: Physician Assistant

## 2015-01-29 ENCOUNTER — Other Ambulatory Visit: Payer: Self-pay | Admitting: Physician Assistant

## 2015-02-11 ENCOUNTER — Other Ambulatory Visit: Payer: Managed Care, Other (non HMO)

## 2015-02-11 DIAGNOSIS — E038 Other specified hypothyroidism: Secondary | ICD-10-CM

## 2015-02-12 LAB — TSH: TSH: 2.25 u[IU]/mL (ref 0.350–4.500)

## 2015-02-17 ENCOUNTER — Telehealth: Payer: Self-pay | Admitting: Internal Medicine

## 2015-02-17 MED ORDER — LEVOTHYROXINE SODIUM 75 MCG PO TABS: ORAL_TABLET | ORAL | Status: DC

## 2015-02-17 NOTE — Telephone Encounter (Signed)
Patient requesting rx for synthroid 37.5 mcg (brand name). running out of samples given by [facility]  Thank you, Leonie Douglas Referral Coordinator  [facility]: [facility] [phone] ext. [phone] Fax [phone]

## 2015-05-07 ENCOUNTER — Other Ambulatory Visit: Payer: Self-pay | Admitting: Physician Assistant

## 2015-05-13 ENCOUNTER — Encounter: Payer: Self-pay | Admitting: Physician Assistant

## 2015-05-19 ENCOUNTER — Ambulatory Visit: Payer: Managed Care, Other (non HMO) | Admitting: Physician Assistant

## 2015-05-19 ENCOUNTER — Encounter: Payer: Self-pay | Admitting: Physician Assistant

## 2015-05-19 VITALS — BP 138/72 | HR 88 | Temp 98.2°F | Resp 18 | Ht 60.0 in | Wt 169.0 lb

## 2015-05-19 DIAGNOSIS — Z79899 Other long term (current) drug therapy: Secondary | ICD-10-CM

## 2015-05-19 DIAGNOSIS — Z0001 Encounter for general adult medical examination with abnormal findings: Secondary | ICD-10-CM

## 2015-05-19 DIAGNOSIS — E038 Other specified hypothyroidism: Secondary | ICD-10-CM

## 2015-05-19 DIAGNOSIS — Z1159 Encounter for screening for other viral diseases: Secondary | ICD-10-CM

## 2015-05-19 DIAGNOSIS — D649 Anemia, unspecified: Secondary | ICD-10-CM

## 2015-05-19 DIAGNOSIS — E669 Obesity, unspecified: Secondary | ICD-10-CM

## 2015-05-19 DIAGNOSIS — Z23 Encounter for immunization: Secondary | ICD-10-CM

## 2015-05-19 DIAGNOSIS — E559 Vitamin D deficiency, unspecified: Secondary | ICD-10-CM

## 2015-05-19 DIAGNOSIS — R7303 Prediabetes: Secondary | ICD-10-CM

## 2015-05-19 DIAGNOSIS — R7309 Other abnormal glucose: Secondary | ICD-10-CM | POA: Insufficient documentation

## 2015-05-19 DIAGNOSIS — F325 Major depressive disorder, single episode, in full remission: Secondary | ICD-10-CM

## 2015-05-19 DIAGNOSIS — M810 Age-related osteoporosis without current pathological fracture: Secondary | ICD-10-CM | POA: Insufficient documentation

## 2015-05-19 DIAGNOSIS — M8580 Other specified disorders of bone density and structure, unspecified site: Secondary | ICD-10-CM

## 2015-05-19 DIAGNOSIS — I10 Essential (primary) hypertension: Secondary | ICD-10-CM

## 2015-05-19 DIAGNOSIS — E785 Hyperlipidemia, unspecified: Secondary | ICD-10-CM

## 2015-05-19 DIAGNOSIS — M1990 Unspecified osteoarthritis, unspecified site: Secondary | ICD-10-CM

## 2015-05-19 DIAGNOSIS — M170 Bilateral primary osteoarthritis of knee: Secondary | ICD-10-CM

## 2015-05-19 LAB — CBC WITH DIFFERENTIAL/PLATELET
BASOS ABS: 0.1 10*3/uL (ref 0.0–0.1)
Basophils Relative: 1 % (ref 0–1)
EOS PCT: 3 % (ref 0–5)
Eosinophils Absolute: 0.2 10*3/uL (ref 0.0–0.7)
HCT: 40.9 % (ref 36.0–46.0)
Hemoglobin: 13.5 g/dL (ref 12.0–15.0)
LYMPHS PCT: 28 % (ref 12–46)
Lymphs Abs: 2.2 10*3/uL (ref 0.7–4.0)
MCH: 31 pg (ref 26.0–34.0)
MCHC: 33 g/dL (ref 30.0–36.0)
MCV: 94 fL (ref 78.0–100.0)
MONO ABS: 0.8 10*3/uL (ref 0.1–1.0)
MPV: 8.9 fL (ref 8.6–12.4)
Monocytes Relative: 10 % (ref 3–12)
NEUTROS ABS: 4.6 10*3/uL (ref 1.7–7.7)
Neutrophils Relative %: 58 % (ref 43–77)
Platelets: 328 10*3/uL (ref 150–400)
RBC: 4.35 MIL/uL (ref 3.87–5.11)
RDW: 13.2 % (ref 11.5–15.5)
WBC: 8 10*3/uL (ref 4.0–10.5)

## 2015-05-19 LAB — HEMOGLOBIN A1C
Hgb A1c MFr Bld: 5.8 % — ABNORMAL HIGH (ref <5.7)
Mean Plasma Glucose: 120 mg/dL — ABNORMAL HIGH (ref <117)

## 2015-05-19 NOTE — Progress Notes (Signed)
Complete Physical  Assessment and Plan: 1. Essential hypertension - continue medications, DASH diet, exercise and monitor at home. Call if greater than 130/80.  - CBC with Differential/Platelet - BASIC METABOLIC PANEL WITH GFR - Hepatic function panel - Urinalysis, Routine w reflex microscopic (not at [facility]) - Microalbumin / creatinine urine ratio  2. Other specified hypothyroidism Hypothyroidism-check TSH level, continue medications the same, reminded to take on an empty stomach 30-57mins before food.  - TSH  3. Hyperlipidemia -continue medications, check lipids, decrease fatty foods, increase activity.  - Lipid panel  4. Obesity Obesity with co morbidities- long discussion about weight loss, diet, and exercise  5. Depression, major, in remission (Rosalie) remission  6. Prediabetes Discussed general issues about diabetes pathophysiology and management., Educational material distributed., Suggested low cholesterol diet., Encouraged aerobic exercise., Discussed foot care., Reminded to get yearly retinal exam. - Hemoglobin A1c - Insulin, fasting  7. Vitamin D deficiency - Vit D  25 hydroxy (rtn osteoporosis monitoring)  8. Osteoarthritis of both knees, unspecified osteoarthritis type Weight loss advised, better after surgery  9. Arthritis Weight loss advised, NSAIDS PRN  10. Osteopenia - DG Bone Density; Future  11. Medication management - Magnesium  12. Anemia, unspecified anemia type - Iron and TIBC - Ferritin - Vitamin B12  13. Need for prophylactic vaccination and inoculation against influenza - Flu vaccine greater than or equal to 3yo with preservative IM  14. Screening for viral disease - Hepatitis C antibody - HIV antibody  15. Encounter for general adult medical examination with abnormal findings Check on MGM, get if not done TDAP, PAP, colonoscopy next year.  - CBC with Differential/Platelet - BASIC METABOLIC PANEL WITH GFR - Hepatic function panel -  TSH - Lipid panel - Hemoglobin A1c - Insulin, fasting - Magnesium - Vit D  25 hydroxy (rtn osteoporosis monitoring) - Urinalysis, Routine w reflex microscopic (not at [facility]) - Microalbumin / creatinine urine ratio - Iron and TIBC - Ferritin - Vitamin B12 - DG Bone Density; Future - Hepatitis C antibody - HIV antibody - Flu vaccine greater than or equal to 3yo with preservative IM    Discussed med's effects and SE's. Screening labs and tests as requested with regular follow-up as recommended.  HPI 64 y.o. female  presents for a complete physical.  Her blood pressure has been controlled at home, today their BP is BP: 138/72 mmHg She does not workout. She denies chest pain, shortness of breath, dizziness.  She is not on cholesterol medication and denies myalgias. Her cholesterol is at goal. The cholesterol last visit was:   Lab Results  Component Value Date   CHOL 214* 01/01/2015   HDL 68 01/01/2015   LDLCALC 131* 01/01/2015   TRIG 76 01/01/2015   CHOLHDL 3.1 01/01/2015   She has not been working on diet and exercise for prediabetes, and denies polydipsia, polyuria and visual disturbances. Last A1C in the office was:  Lab Results  Component Value Date   HGBA1C 5.7* 01/01/2015   Patient is on Vitamin D supplement, she is on 5000 IU daily.  Lab Results  Component Value Date   VD25OH 30 01/01/2015   She is on thyroid medication. Her medication was changed last visit, she is on 25 mcg daily of the generic levothyroxine, she was unable to tolerate the 19mcg 2 days a week. She has heat intolerence but otherwise has fatigue in the morning, dry skin, brittle hair. Patient denies nervousness and palpitations. Her last TSH was  Lab Results  Component Value Date   TSH 2.250 02/11/2015   + vaginal dryness but albolene helps.  She will occ have a "cough"/"choking" spell due to spasm in her neck, lasts a few mins, no always associated with food but husband wanted her to mention.  Happens once a month.  BMI is Body mass index is 33.01 kg/(m^2)., she is struggling with weight loss due to stress, time limitations. Wt Readings from Last 3 Encounters:  05/19/15 169 lb (76.658 kg)  01/01/15 177 lb (80.287 kg)  05/10/14 180 lb (81.647 kg)    Current Medications:  Current Outpatient Prescriptions on File Prior to Visit  Medication Sig Dispense Refill  . DULoxetine (CYMBALTA) 30 MG capsule TAKE ONE CAPSULE BY MOUTH EVERY DAY 90 capsule 1  . erythromycin with ethanol (EMGEL) 2 % gel Apply topically 2 (two) times daily. 30 g 2  . levothyroxine (SYNTHROID, LEVOTHROID) 75 MCG tablet TAKE 1/2 TABLET (37.5 MCG TOTAL) BY MOUTH EVERY MORNING. 90 tablet 0  . metroNIDAZOLE (METROCREAM) 0.75 % cream Apply topically 2 (two) times daily. 45 g 1  . raloxifene (EVISTA) 60 MG tablet TAKE ONE TABLET BY MOUTH EVERY DAY FOR OSTEOPOROSIS 30 tablet 9   No current facility-administered medications on file prior to visit.   Health Maintenance:   Immunization History  Administered Date(s) Administered  . Influenza Split 05/10/2014  . Td 06/02/2006   Tetanus: 2007 Pneumovax: N/A Prevnar 13: age 65 due Flu vaccine: 2015 TODAY Zostavax: declines Pap: 2014 due 3 years MGM: 10/2012, thinks has had last year but will call [facility] and check.  DEXA: 2014 + osteopenia due 2016 Colonoscopy: 2007 due 2017 EGD: N/A CTA 09/2012  [name], [name] [name], MD as Consulting Physician (Orthopedic Surgery) [name], MD as Consulting Physician (Ophthalmology) [name], MD as Consulting Physician (Gastroenterology)   Allergies:  Allergies  Allergen Reactions  . Oxycodone     "Found Oxycodone not effective for pain control" with last RTKA   Medical History:  Past Medical History  Diagnosis Date  . Osteopenia   . Urinary tract infection 12-15-2011  . Hyperlipidemia   . Hypertension   . Arthritis   . Hypothyroid   .  Depression   . Vitamin D deficiency    Surgical History:  Past Surgical History  Procedure Laterality Date  . C sections  jan 1980 and march 1984  . Ovarian cyst with lararoscopy march 1998    . Total knee arthroplasty  01/03/2012    Procedure: TOTAL KNEE ARTHROPLASTY;  Surgeon: Gearlean Alf, MD;  Location: [facility];  Service: Orthopedics;  Laterality: Right;  . Wisdom tooth extraction  09-05-12    extractions  . Total knee arthroplasty Left 09/11/2012    Procedure: Left Total Knee Arthroplasty;  Surgeon: Gearlean Alf, MD;  Location: [facility];  Service: Orthopedics;  Laterality: Left;   Family History:  Family History  Problem Relation Age of Onset  . Hypertension Mother   . Thyroid disease Mother   . Arthritis Mother   . Cancer Mother   . COPD Father    Social History:  Social History  Substance Use Topics  . Smoking status: Never Smoker   . Smokeless tobacco: Never Used  . Alcohol Use: Yes     Comment: 1-2 glass wine night   Review of Systems  Constitutional: Positive for malaise/fatigue. Negative for fever, chills, weight loss and diaphoresis.  HENT: Negative.   Eyes: Negative.  Respiratory: Positive for cough. Negative for hemoptysis, sputum production, shortness of breath and wheezing.   Cardiovascular: Negative.  Negative for claudication.  Gastrointestinal: Negative.   Genitourinary: Negative.   Musculoskeletal: Negative.   Skin: Negative for itching and rash.  Neurological: Negative.  Negative for weakness.  Endo/Heme/Allergies: Negative.   Psychiatric/Behavioral: Negative.     Physical Exam: Estimated body mass index is 33.01 kg/(m^2) as calculated from the following:   Height as of this encounter: 5' (1.524 m).   Weight as of this encounter: 169 lb (76.658 kg). BP 138/72 mmHg  Pulse 88  Temp(Src) 98.2 F (36.8 C) (Temporal)  Resp 18  Ht 5' (1.524 m)  Wt 169 lb (76.658 kg)  BMI 33.01 kg/m2 General Appearance: Well nourished, in no apparent  distress. Eyes: PERRLA, EOMs, conjunctiva no swelling or erythema, normal fundi and vessels. Sinuses: No Frontal/maxillary tenderness ENT/Mouth: Ext aud canals clear, normal light reflex with TMs without erythema, bulging.  Good dentition. No erythema, swelling, or exudate on post pharynx. Tonsils not swollen or erythematous. Hearing normal.  Neck: Supple, thyroid normal. No bruits Respiratory: Respiratory effort normal, BS equal bilaterally without rales, rhonchi, wheezing or stridor. Cardio: RRR without murmurs, rubs or gallops. Brisk peripheral pulses without edema.  Chest: symmetric, with normal excursions and percussion. Breasts: Symmetric, without masses, without nipple discharge, retractions. Abdomen: Soft, +BS. Non tender, no guarding, rebound, hernias, masses, or organomegaly. .  Lymphatics: Non tender without lymphadenopathy.  Genitourinary: defer Musculoskeletal: Full ROM all peripheral extremities,5/5 strength, and normal gait. Skin: Warm, dry without rashes, lesions, ecchymosis.  Neuro: Cranial nerves intact, reflexes equal bilaterally. Normal muscle tone, no cerebellar symptoms. Sensation intact.  Psych: Awake and oriented X 3, normal affect, Insight and Judgment appropriate.   EKG: declines AORTA SCAN: defer   Vicie Mutters 2:24 PM

## 2015-05-19 NOTE — Patient Instructions (Addendum)
Check when was your last MGM  Encourage you to get the 3D Mammogram  The 3D Mammogram is much more specific and sensitive to pick up breast cancer. For women with fibrocystic breast or lumpy breast it can be hard to determine if it is cancer or not but the 3D mammogram is able to tell this difference which cuts back on unneeded additional tests or scary call backs.   - over 40% increase in detection of breast cancer - over 40% reduction in false positives.  - fewer call backs  - reduced anxiety - improved outcomes - [facility]  [facility] Schedule an appointment by calling [phone].  Preventive Care for Adults A healthy lifestyle and preventive care can promote health and wellness. Preventive health guidelines for women include the following key practices.  A routine yearly physical is a good way to check with your health care provider about your health and preventive screening. It is a chance to share any concerns and updates on your health and to receive a thorough exam.  Visit your dentist for a routine exam and preventive care every 6 months. Brush your teeth twice a day and floss once a day. Good oral hygiene prevents tooth decay and gum disease.  The frequency of eye exams is based on your age, health, family medical history, use of contact lenses, and other factors. Follow your health care provider's recommendations for frequency of eye exams.  Eat a healthy diet. Foods like vegetables, fruits, whole grains, low-fat dairy products, and lean protein foods contain the nutrients you need without too many calories. Decrease your intake of foods high in solid fats, added sugars, and salt. Eat the right amount of calories for you.Get information about a proper diet from your health care provider, if necessary.  Regular physical exercise is one of the most important things you can do for your health. Most adults should get at least 150 minutes of moderate-intensity exercise  (any activity that increases your heart rate and causes you to sweat) each week. In addition, most adults need muscle-strengthening exercises on 2 or more days a week.  Maintain a healthy weight. The body mass index (BMI) is a screening tool to identify possible weight problems. It provides an estimate of body fat based on height and weight. Your health care provider can find your BMI and can help you achieve or maintain a healthy weight.For adults 20 years and older:  A BMI below 18.5 is considered underweight.  A BMI of 18.5 to 24.9 is normal.  A BMI of 25 to 29.9 is considered overweight.  A BMI of 30 and above is considered obese.  Maintain normal blood lipids and cholesterol levels by exercising and minimizing your intake of saturated fat. Eat a balanced diet with plenty of fruit and vegetables. If your lipid or cholesterol levels are high, you are over 50, or you are at high risk for heart disease, you may need your cholesterol levels checked more frequently.Ongoing high lipid and cholesterol levels should be treated with medicines if diet and exercise are not working.  If you smoke, find out from your health care provider how to quit. If you do not use tobacco, do not start.  Lung cancer screening is recommended for adults aged 30-80 years who are at high risk for developing lung cancer because of a history of smoking. A yearly low-dose CT scan of the lungs is recommended for people who have at least a 30-pack-year history of smoking and  are a current smoker or have quit within the past 15 years. A pack year of smoking is smoking an average of 1 pack of cigarettes a day for 1 year (for example: 1 pack a day for 30 years or 2 packs a day for 15 years). Yearly screening should continue until the smoker has stopped smoking for at least 15 years. Yearly screening should be stopped for people who develop a health problem that would prevent them from having lung cancer treatment.  Avoid use of  street drugs. Do not share needles with anyone. Ask for help if you need support or instructions about stopping the use of drugs.  High blood pressure causes heart disease and increases the risk of stroke.  Ongoing high blood pressure should be treated with medicines if weight loss and exercise do not work.  If you are 88-22 years old, ask your health care provider if you should take aspirin to prevent strokes.  Diabetes screening involves taking a blood sample to check your fasting blood sugar level. This should be done once every 3 years, after age 59, if you are within normal weight and without risk factors for diabetes. Testing should be considered at a younger age or be carried out more frequently if you are overweight and have at least 1 risk factor for diabetes.  Breast cancer screening is essential preventive care for women. You should practice "breast self-awareness." This means understanding the normal appearance and feel of your breasts and may include breast self-examination. Any changes detected, no matter how small, should be reported to a health care provider. Women in their 46s and 30s should have a clinical breast exam (CBE) by a health care provider as part of a regular health exam every 1 to 3 years. After age 33, women should have a CBE every year. Starting at age 59, women should consider having a mammogram (breast X-ray test) every year. Women who have a family history of breast cancer should talk to their health care provider about genetic screening. Women at a high risk of breast cancer should talk to their health care providers about having an MRI and a mammogram every year.  Breast cancer gene (BRCA)-related cancer risk assessment is recommended for women who have family members with BRCA-related cancers. BRCA-related cancers include breast, ovarian, tubal, and peritoneal cancers. Having family members with these cancers may be associated with an increased risk for harmful changes  (mutations) in the breast cancer genes BRCA1 and BRCA2. Results of the assessment will determine the need for genetic counseling and BRCA1 and BRCA2 testing.  Routine pelvic exams to screen for cancer are no longer recommended for nonpregnant women who are considered low risk for cancer of the pelvic organs (ovaries, uterus, and vagina) and who do not have symptoms. Ask your health care provider if a screening pelvic exam is right for you.  If you have had past treatment for cervical cancer or a condition that could lead to cancer, you need Pap tests and screening for cancer for at least 20 years after your treatment. If Pap tests have been discontinued, your risk factors (such as having a new sexual partner) need to be reassessed to determine if screening should be resumed. Some women have medical problems that increase the chance of getting cervical cancer. In these cases, your health care provider may recommend more frequent screening and Pap tests.    Colorectal cancer can be detected and often prevented. Most routine colorectal cancer screening begins at the  age of 50 years and continues through age 75 years. However, your health care provider may recommend screening at an earlier age if you have risk factors for colon cancer. On a yearly basis, your health care provider may provide home test kits to check for hidden blood in the stool. Use of a small camera at the end of a tube, to directly examine the colon (sigmoidoscopy or colonoscopy), can detect the earliest forms of colorectal cancer. Talk to your health care provider about this at age 50, when routine screening begins. Direct exam of the colon should be repeated every 5-10 years through age 75 years, unless early forms of pre-cancerous polyps or small growths are found.  Osteoporosis is a disease in which the bones lose minerals and strength with aging. This can result in serious bone fractures or breaks. The risk of osteoporosis can be  identified using a bone density scan. Women ages 65 years and over and women at risk for fractures or osteoporosis should discuss screening with their health care providers. Ask your health care provider whether you should take a calcium supplement or vitamin D to reduce the rate of osteoporosis.  Menopause can be associated with physical symptoms and risks. Hormone replacement therapy is available to decrease symptoms and risks. You should talk to your health care provider about whether hormone replacement therapy is right for you.  Use sunscreen. Apply sunscreen liberally and repeatedly throughout the day. You should seek shade when your shadow is shorter than you. Protect yourself by wearing long sleeves, pants, a wide-brimmed hat, and sunglasses year round, whenever you are outdoors.  Once a month, do a whole body skin exam, using a mirror to look at the skin on your back. Tell your health care provider of new moles, moles that have irregular borders, moles that are larger than a pencil eraser, or moles that have changed in shape or color.  Stay current with required vaccines (immunizations).  Influenza vaccine. All adults should be immunized every year.  Tetanus, diphtheria, and acellular pertussis (Td, Tdap) vaccine. Pregnant women should receive 1 dose of Tdap vaccine during each pregnancy. The dose should be obtained regardless of the length of time since the last dose. Immunization is preferred during the 27th-36th week of gestation. An adult who has not previously received Tdap or who does not know her vaccine status should receive 1 dose of Tdap. This initial dose should be followed by tetanus and diphtheria toxoids (Td) booster doses every 10 years. Adults with an unknown or incomplete history of completing a 3-dose immunization series with Td-containing vaccines should begin or complete a primary immunization series including a Tdap dose. Adults should receive a Td booster every 10  years.    Zoster vaccine. One dose is recommended for adults aged 60 years or older unless certain conditions are present.    Pneumococcal 13-valent conjugate (PCV13) vaccine. When indicated, a person who is uncertain of her immunization history and has no record of immunization should receive the PCV13 vaccine. An adult aged 19 years or older who has certain medical conditions and has not been previously immunized should receive 1 dose of PCV13 vaccine. This PCV13 should be followed with a dose of pneumococcal polysaccharide (PPSV23) vaccine. The PPSV23 vaccine dose should be obtained at least 8 weeks after the dose of PCV13 vaccine. An adult aged 19 years or older who has certain medical conditions and previously received 1 or more doses of PPSV23 vaccine should receive 1 dose of PCV13.   The PCV13 vaccine dose should be obtained 1 or more years after the last PPSV23 vaccine dose.    Pneumococcal polysaccharide (PPSV23) vaccine. When PCV13 is also indicated, PCV13 should be obtained first. All adults aged 65 years and older should be immunized. An adult younger than age 65 years who has certain medical conditions should be immunized. Any person who resides in a nursing home or long-term care facility should be immunized. An adult smoker should be immunized. People with an immunocompromised condition and certain other conditions should receive both PCV13 and PPSV23 vaccines. People with human immunodeficiency virus (HIV) infection should be immunized as soon as possible after diagnosis. Immunization during chemotherapy or radiation therapy should be avoided. Routine use of PPSV23 vaccine is not recommended for American Indians, [place] Natives, or people younger than 65 years unless there are medical conditions that require PPSV23 vaccine. When indicated, people who have unknown immunization and have no record of immunization should receive PPSV23 vaccine. One-time revaccination 5 years after the first  dose of PPSV23 is recommended for people aged 19-64 years who have chronic kidney failure, nephrotic syndrome, asplenia, or immunocompromised conditions. People who received 1-2 doses of PPSV23 before age 65 years should receive another dose of PPSV23 vaccine at age 65 years or later if at least 5 years have passed since the previous dose. Doses of PPSV23 are not needed for people immunized with PPSV23 at or after age 65 years.   Preventive Services / Frequency  Ages 65 years and over  Blood pressure check.  Lipid and cholesterol check.  Lung cancer screening. / Every year if you are aged 55-80 years and have a 30-pack-year history of smoking and currently smoke or have quit within the past 15 years. Yearly screening is stopped once you have quit smoking for at least 15 years or develop a health problem that would prevent you from having lung cancer treatment.  Clinical breast exam.** / Every year after age 40 years.  BRCA-related cancer risk assessment.** / For women who have family members with a BRCA-related cancer (breast, ovarian, tubal, or peritoneal cancers).  Mammogram.** / Every year beginning at age 40 years and continuing for as long as you are in good health. Consult with your health care provider.  Pap test.** / Every 3 years starting at age 30 years through age 65 or 70 years with 3 consecutive normal Pap tests. Testing can be stopped between 65 and 70 years with 3 consecutive normal Pap tests and no abnormal Pap or HPV tests in the past 10 years.  Fecal occult blood test (FOBT) of stool. / Every year beginning at age 50 years and continuing until age 75 years. You may not need to do this test if you get a colonoscopy every 10 years.  Flexible sigmoidoscopy or colonoscopy.** / Every 5 years for a flexible sigmoidoscopy or every 10 years for a colonoscopy beginning at age 50 years and continuing until age 75 years.  Hepatitis C blood test.** / For all people born from 1945  through 1965 and any individual with known risks for hepatitis C.  Osteoporosis screening.** / A one-time screening for women ages 65 years and over and women at risk for fractures or osteoporosis.  Skin self-exam. / Monthly.  Influenza vaccine. / Every year.  Tetanus, diphtheria, and acellular pertussis (Tdap/Td) vaccine.** / 1 dose of Td every 10 years.  Zoster vaccine.** / 1 dose for adults aged 60 years or older.  Pneumococcal 13-valent conjugate (PCV13) vaccine.** /   Consult your health care provider.  Pneumococcal polysaccharide (PPSV23) vaccine.** / 1 dose for all adults aged 65 years and older. Screening for abdominal aortic aneurysm (AAA)  by ultrasound is recommended for people who have history of high blood pressure or who are current or former smokers.  

## 2015-05-20 LAB — HEPATIC FUNCTION PANEL
ALK PHOS: 83 U/L (ref 33–130)
ALT: 22 U/L (ref 6–29)
AST: 25 U/L (ref 10–35)
Albumin: 4 g/dL (ref 3.6–5.1)
BILIRUBIN DIRECT: 0.1 mg/dL (ref <=0.2)
BILIRUBIN INDIRECT: 0.4 mg/dL (ref 0.2–1.2)
BILIRUBIN TOTAL: 0.5 mg/dL (ref 0.2–1.2)
Total Protein: 7.1 g/dL (ref 6.1–8.1)

## 2015-05-20 LAB — MICROALBUMIN / CREATININE URINE RATIO
CREATININE, URINE: 87 mg/dL (ref 20–320)
MICROALB UR: 0.2 mg/dL
MICROALB/CREAT RATIO: 2 ug/mg{creat} (ref <30)

## 2015-05-20 LAB — URINALYSIS, ROUTINE W REFLEX MICROSCOPIC
Bilirubin Urine: NEGATIVE
Glucose, UA: NEGATIVE
HGB URINE DIPSTICK: NEGATIVE
Ketones, ur: NEGATIVE
LEUKOCYTES UA: NEGATIVE
NITRITE: NEGATIVE
PH: 7 (ref 5.0–8.0)
Protein, ur: NEGATIVE
SPECIFIC GRAVITY, URINE: 1.016 (ref 1.001–1.035)

## 2015-05-20 LAB — IRON AND TIBC
%SAT: 38 % (ref 11–50)
Iron: 144 ug/dL (ref 45–160)
TIBC: 380 ug/dL (ref 250–450)
UIBC: 236 ug/dL (ref 125–400)

## 2015-05-20 LAB — LIPID PANEL
CHOL/HDL RATIO: 2.1 ratio (ref <=5.0)
Cholesterol: 205 mg/dL — ABNORMAL HIGH (ref 125–200)
HDL: 96 mg/dL (ref >=46)
LDL CALC: 96 mg/dL (ref <130)
TRIGLYCERIDES: 65 mg/dL (ref <150)
VLDL: 13 mg/dL (ref <30)

## 2015-05-20 LAB — BASIC METABOLIC PANEL WITH GFR
BUN: 17 mg/dL (ref 7–25)
CO2: 27 mmol/L (ref 20–31)
Calcium: 8.9 mg/dL (ref 8.6–10.4)
Chloride: 100 mmol/L (ref 98–110)
Creat: 1.04 mg/dL — ABNORMAL HIGH (ref 0.50–0.99)
GFR, EST AFRICAN AMERICAN: 67 mL/min (ref >=60)
GFR, EST NON AFRICAN AMERICAN: 58 mL/min — AB (ref >=60)
GLUCOSE: 99 mg/dL (ref 65–99)
POTASSIUM: 4.2 mmol/L (ref 3.5–5.3)
SODIUM: 138 mmol/L (ref 135–146)

## 2015-05-20 LAB — HIV ANTIBODY (ROUTINE TESTING W REFLEX): HIV 1&2 Ab, 4th Generation: NONREACTIVE

## 2015-05-20 LAB — INSULIN, FASTING: Insulin fasting, serum: 16.6 u[IU]/mL (ref 2.0–19.6)

## 2015-05-20 LAB — MAGNESIUM: Magnesium: 2.1 mg/dL (ref 1.5–2.5)

## 2015-05-20 LAB — VITAMIN B12: Vitamin B-12: 453 pg/mL (ref 211–911)

## 2015-05-20 LAB — TSH: TSH: 1.811 u[IU]/mL (ref 0.350–4.500)

## 2015-05-20 LAB — HEPATITIS C ANTIBODY: HCV AB: NEGATIVE

## 2015-05-20 LAB — FERRITIN: Ferritin: 136 ng/mL (ref 10–291)

## 2015-05-20 LAB — VITAMIN D 25 HYDROXY (VIT D DEFICIENCY, FRACTURES): Vit D, 25-Hydroxy: 39 ng/mL (ref 30–100)

## 2015-05-20 NOTE — Addendum Note (Signed)
Addended by: Vicie Mutters R on: 05/20/2015 12:36 PM   Modules accepted: Orders, SmartSet

## 2015-05-26 ENCOUNTER — Encounter: Payer: Self-pay | Admitting: *Deleted

## 2015-06-11 ENCOUNTER — Other Ambulatory Visit: Payer: Self-pay

## 2015-06-11 MED ORDER — DULOXETINE HCL 30 MG PO CPEP: 30.0000 mg | ORAL_CAPSULE | Freq: Every day | ORAL | Status: DC

## 2015-06-12 ENCOUNTER — Other Ambulatory Visit: Payer: Self-pay | Admitting: Physician Assistant

## 2015-06-12 DIAGNOSIS — Z1231 Encounter for screening mammogram for malignant neoplasm of breast: Secondary | ICD-10-CM

## 2015-06-24 ENCOUNTER — Other Ambulatory Visit: Payer: Managed Care, Other (non HMO)

## 2015-06-24 DIAGNOSIS — I10 Essential (primary) hypertension: Secondary | ICD-10-CM | POA: Diagnosis not present

## 2015-06-25 ENCOUNTER — Other Ambulatory Visit: Payer: Self-pay

## 2015-06-25 LAB — BASIC METABOLIC PANEL WITH GFR
BUN: 14 mg/dL (ref 7–25)
CALCIUM: 8.9 mg/dL (ref 8.6–10.4)
CO2: 25 mmol/L (ref 20–31)
Chloride: 102 mmol/L (ref 98–110)
Creat: 0.74 mg/dL (ref 0.50–0.99)
GFR, EST NON AFRICAN AMERICAN: 87 mL/min (ref >=60)
GLUCOSE: 87 mg/dL (ref 65–99)
Potassium: 4 mmol/L (ref 3.5–5.3)
Sodium: 138 mmol/L (ref 135–146)

## 2015-07-18 ENCOUNTER — Other Ambulatory Visit: Payer: Self-pay | Admitting: Physician Assistant

## 2015-07-18 ENCOUNTER — Ambulatory Visit
Admission: RE | Admit: 2015-07-18 | Discharge: 2015-07-18 | Disposition: A | Discharge status: Home / Self Care | Payer: Managed Care, Other (non HMO) | Source: Ambulatory Visit | Attending: Physician Assistant | Admitting: Physician Assistant

## 2015-07-18 DIAGNOSIS — Z0001 Encounter for general adult medical examination with abnormal findings: Secondary | ICD-10-CM

## 2015-07-18 DIAGNOSIS — Z1231 Encounter for screening mammogram for malignant neoplasm of breast: Secondary | ICD-10-CM

## 2015-07-18 DIAGNOSIS — M8580 Other specified disorders of bone density and structure, unspecified site: Secondary | ICD-10-CM

## 2015-08-19 ENCOUNTER — Other Ambulatory Visit: Payer: Self-pay | Admitting: Physician Assistant

## 2015-11-18 ENCOUNTER — Ambulatory Visit: Payer: Self-pay | Admitting: Physician Assistant

## 2015-12-17 ENCOUNTER — Ambulatory Visit: Payer: Self-pay | Admitting: Physician Assistant

## 2016-01-21 ENCOUNTER — Ambulatory Visit: Payer: Self-pay | Admitting: Physician Assistant

## 2016-02-23 ENCOUNTER — Encounter: Payer: Self-pay | Admitting: Physician Assistant

## 2016-02-23 ENCOUNTER — Ambulatory Visit (INDEPENDENT_AMBULATORY_CARE_PROVIDER_SITE_OTHER): Payer: Managed Care, Other (non HMO) | Admitting: Physician Assistant

## 2016-02-23 VITALS — BP 136/70 | HR 79 | Temp 97.3°F | Resp 14 | Ht 60.0 in | Wt 177.4 lb

## 2016-02-23 DIAGNOSIS — E669 Obesity, unspecified: Secondary | ICD-10-CM | POA: Diagnosis not present

## 2016-02-23 DIAGNOSIS — E038 Other specified hypothyroidism: Secondary | ICD-10-CM

## 2016-02-23 DIAGNOSIS — I10 Essential (primary) hypertension: Secondary | ICD-10-CM

## 2016-02-23 DIAGNOSIS — R7303 Prediabetes: Secondary | ICD-10-CM | POA: Diagnosis not present

## 2016-02-23 DIAGNOSIS — Z79899 Other long term (current) drug therapy: Secondary | ICD-10-CM | POA: Diagnosis not present

## 2016-02-23 DIAGNOSIS — F325 Major depressive disorder, single episode, in full remission: Secondary | ICD-10-CM | POA: Diagnosis not present

## 2016-02-23 DIAGNOSIS — E785 Hyperlipidemia, unspecified: Secondary | ICD-10-CM

## 2016-02-23 DIAGNOSIS — E559 Vitamin D deficiency, unspecified: Secondary | ICD-10-CM

## 2016-02-23 LAB — CBC WITH DIFFERENTIAL/PLATELET
BASOS PCT: 0 %
Basophils Absolute: 0 cells/uL (ref 0–200)
EOS ABS: 312 {cells}/uL (ref 15–500)
Eosinophils Relative: 3 %
HEMATOCRIT: 42.3 % (ref 35.0–45.0)
HEMOGLOBIN: 14.5 g/dL (ref 11.7–15.5)
LYMPHS ABS: 4576 {cells}/uL — AB (ref 850–3900)
LYMPHS PCT: 44 %
MCH: 31.6 pg (ref 27.0–33.0)
MCHC: 34.3 g/dL (ref 32.0–36.0)
MCV: 92.2 fL (ref 80.0–100.0)
MONO ABS: 1144 {cells}/uL — AB (ref 200–950)
MPV: 9.6 fL (ref 7.5–12.5)
Monocytes Relative: 11 %
NEUTROS PCT: 42 %
Neutro Abs: 4368 cells/uL (ref 1500–7800)
PLATELETS: 317 10*3/uL (ref 140–400)
RBC: 4.59 MIL/uL (ref 3.80–5.10)
RDW: 13.1 % (ref 11.0–15.0)
WBC: 10.4 10*3/uL (ref 3.8–10.8)

## 2016-02-23 LAB — BASIC METABOLIC PANEL WITH GFR
BUN: 13 mg/dL (ref 7–25)
CALCIUM: 9.2 mg/dL (ref 8.6–10.4)
CO2: 24 mmol/L (ref 20–31)
CREATININE: 0.79 mg/dL (ref 0.50–0.99)
Chloride: 103 mmol/L (ref 98–110)
GFR, Est Non African American: 80 mL/min (ref >=60)
Glucose, Bld: 102 mg/dL — ABNORMAL HIGH (ref 65–99)
Potassium: 4.1 mmol/L (ref 3.5–5.3)
SODIUM: 139 mmol/L (ref 135–146)

## 2016-02-23 LAB — HEPATIC FUNCTION PANEL
ALBUMIN: 4.5 g/dL (ref 3.6–5.1)
ALT: 21 U/L (ref 6–29)
AST: 22 U/L (ref 10–35)
Alkaline Phosphatase: 86 U/L (ref 33–130)
BILIRUBIN DIRECT: 0.1 mg/dL (ref <=0.2)
BILIRUBIN TOTAL: 0.5 mg/dL (ref 0.2–1.2)
Indirect Bilirubin: 0.4 mg/dL (ref 0.2–1.2)
Total Protein: 7.2 g/dL (ref 6.1–8.1)

## 2016-02-23 LAB — LIPID PANEL
CHOL/HDL RATIO: 2 ratio (ref <=5.0)
CHOLESTEROL: 210 mg/dL — AB (ref 125–200)
HDL: 107 mg/dL (ref >=46)
LDL Cholesterol: 88 mg/dL (ref <130)
Triglycerides: 73 mg/dL (ref <150)
VLDL: 15 mg/dL (ref <30)

## 2016-02-23 LAB — TSH: TSH: 5.15 m[IU]/L — AB

## 2016-02-23 LAB — HEMOGLOBIN A1C
Hgb A1c MFr Bld: 5.8 % — ABNORMAL HIGH (ref <5.7)
Mean Plasma Glucose: 120 mg/dL

## 2016-02-23 LAB — MAGNESIUM: Magnesium: 2.2 mg/dL (ref 1.5–2.5)

## 2016-02-23 MED ORDER — ALPRAZOLAM 0.5 MG PO TABS: 0.5000 mg | ORAL_TABLET | Freq: Three times a day (TID) | ORAL | 0 refills | Status: DC | PRN

## 2016-02-23 MED ORDER — SYNTHROID 75 MCG PO TABS: 37.5000 ug | ORAL_TABLET | Freq: Every morning | ORAL | 1 refills | Status: DC

## 2016-02-23 NOTE — Progress Notes (Signed)
Assessment and Plan:  1. Hypertension -Continue medication, monitor blood pressure at home. Continue DASH diet.  Reminder to go to the ER if any CP, SOB, nausea, dizziness, severe HA, changes vision/speech, left arm numbness and tingling and jaw pain.  2. Cholesterol -Continue diet and exercise. Check cholesterol.   3. Prediabetes  -Continue diet and exercise. Check A1C  4. Vitamin D Def - check level and continue medications.   5. Hypothyroidism -check TSH level, continue medications the same, reminded to take on an empty stomach 30-59mins before food.  Continue brand name and may try 1/2 of 46mcg  6. Obesity with co morbidities - long discussion about weight loss, diet, and exercise  7. Depression/anxiety Will do xanax 0.5 #20, no refills, if taking frequently will increase cymbalta.   Continue diet and meds as discussed. Further disposition pending results of labs. Over 30 minutes of exam, counseling, chart review, and critical decision making was performed Future Appointments Date Time Provider Many  05/19/2016 2:00 PM Vicie Mutters, PA-C [id number] None   HPI 65 y.o. female  presents for 3 month follow up on hypertension, cholesterol, prediabetes, and vitamin D deficiency.   Her blood pressure has been controlled at home, today their BP is BP: 136/70  She does not workout. She denies chest pain, shortness of breath, dizziness. She is on cymbalta for depression, states it has not been working as well lately.   She is not on cholesterol medication and denies myalgias. Her cholesterol is at goal. The cholesterol last visit was:   Lab Results  Component Value Date   CHOL 205 (H) 05/19/2015   HDL 96 05/19/2015   LDLCALC 96 05/19/2015   TRIG 65 05/19/2015   CHOLHDL 2.1 05/19/2015    She has been working on diet and exercise for prediabetes, and denies paresthesia of the feet, polydipsia, polyuria and visual disturbances. Last A1C in the office was:  Lab Results   Component Value Date   HGBA1C 5.8 (H) 05/19/2015   Patient is on Vitamin D supplement.   Lab Results  Component Value Date   VD25OH 39 05/19/2015     She is on thyroid medication, she was switched to brand name last visit and can not tell a difference, if higher doses of thyroid she will have some palpitations.    Lab Results  Component Value Date   TSH 1.811 05/19/2015  .  BMI is Body mass index is 34.65 kg/m., she is working on diet and exercise. Wt Readings from Last 3 Encounters:  02/23/16 177 lb 6.4 oz (80.5 kg)  05/19/15 169 lb (76.7 kg)  01/01/15 177 lb (80.3 kg)    Current Medications:  Current Outpatient Prescriptions on File Prior to Visit  Medication Sig Dispense Refill  . DULoxetine (CYMBALTA) 30 MG capsule Take 1 capsule (30 mg total) by mouth daily. 90 capsule 1  . levothyroxine (SYNTHROID, LEVOTHROID) 75 MCG tablet TAKE 1/2 TABLET (37.5 MCG TOTAL) BY MOUTH EVERY MORNING. 90 tablet 0  . metroNIDAZOLE (METROCREAM) 0.75 % cream Apply topically 2 (two) times daily. 45 g 1  . raloxifene (EVISTA) 60 MG tablet TAKE ONE TABLET BY MOUTH EVERY DAY FOR OSTEOPOROSIS 30 tablet 9  . SYNTHROID 75 MCG tablet TAKE 1/2 TABLET BY MOUTH EVERY MORNING 90 tablet 0   No current facility-administered medications on file prior to visit.    Medical History:  Past Medical History:  Diagnosis Date  . Arthritis   . Depression   . Hyperlipidemia   .  Hypertension   . Hypothyroid   . Osteopenia   . Urinary tract infection 12-15-2011  . Vitamin D deficiency    Allergies:  Allergies  Allergen Reactions  . Oxycodone     "Found Oxycodone not effective for pain control" with last RTKA     Review of Systems:  Review of Systems  Constitutional: Negative.   HENT: Negative.   Eyes: Negative.   Respiratory: Negative.   Cardiovascular: Negative.   Gastrointestinal: Negative.   Genitourinary: Negative.   Musculoskeletal: Negative.   Skin: Negative for itching.  Neurological:  Negative.   Endo/Heme/Allergies: Negative.   Psychiatric/Behavioral: Negative.     Family history- Review and unchanged Social history- Review and unchanged Physical Exam: BP 136/70   Pulse 79   Temp 97.3 F (36.3 C) (Temporal)   Resp 14   Ht 5' (1.524 m)   Wt 177 lb 6.4 oz (80.5 kg)   SpO2 98%   BMI 34.65 kg/m  Wt Readings from Last 3 Encounters:  02/23/16 177 lb 6.4 oz (80.5 kg)  05/19/15 169 lb (76.7 kg)  01/01/15 177 lb (80.3 kg)   General Appearance: Well nourished, in no apparent distress. Eyes: PERRLA, EOMs, conjunctiva no swelling or erythema Sinuses: No Frontal/maxillary tenderness ENT/Mouth: Ext aud canals clear, TMs without erythema, bulging. No erythema, swelling, or exudate on post pharynx.  Tonsils not swollen or erythematous. Hearing normal.  Neck: Supple, thyroid normal.  Respiratory: Respiratory effort normal, BS equal bilaterally without rales, rhonchi, wheezing or stridor.  Cardio: RRR with no MRGs. Brisk peripheral pulses without edema.  Abdomen: Soft, + BS,  Non tender, no guarding, rebound, hernias, masses. Lymphatics: Non tender without lymphadenopathy.  Musculoskeletal: Full ROM, 5/5 strength, Normal gait Skin: Warm, dry without rashes, lesions, ecchymosis.  Neuro: Cranial nerves intact. Normal muscle tone, no cerebellar symptoms. Psych: Awake and oriented X 3, normal affect, Insight and Judgment appropriate.    Vicie Mutters, PA-C 2:54 PM [facility]

## 2016-02-23 NOTE — Patient Instructions (Signed)
Generalized Anxiety Disorder Generalized anxiety disorder (GAD) is a mental disorder. It interferes with life functions, including relationships, work, and school. GAD is different from normal anxiety, which everyone experiences at some point in their lives in response to specific life events and activities. Normal anxiety actually helps us prepare for and get through these life events and activities. Normal anxiety goes away after the event or activity is over.  GAD causes anxiety that is not necessarily related to specific events or activities. It also causes excess anxiety in proportion to specific events or activities. The anxiety associated with GAD is also difficult to control. GAD can vary from mild to severe. People with severe GAD can have intense waves of anxiety with physical symptoms (panic attacks).  SYMPTOMS The anxiety and worry associated with GAD are difficult to control. This anxiety and worry are related to many life events and activities and also occur more days than not for 6 months or longer. People with GAD also have three or more of the following symptoms (one or more in children):  Restlessness.   Fatigue.  Difficulty concentrating.   Irritability.  Muscle tension.  Difficulty sleeping or unsatisfying sleep. DIAGNOSIS GAD is diagnosed through an assessment by your health care provider. Your health care provider will ask you questions aboutyour mood,physical symptoms, and events in your life. Your health care provider may ask you about your medical history and use of alcohol or drugs, including prescription medicines. Your health care provider may also do a physical exam and blood tests. Certain medical conditions and the use of certain substances can cause symptoms similar to those associated with GAD. Your health care provider may refer you to a mental health specialist for further evaluation. TREATMENT The following therapies are usually used to treat GAD:    Medication. Antidepressant medication usually is prescribed for long-term daily control. Antianxiety medicines may be added in severe cases, especially when panic attacks occur.   Talk therapy (psychotherapy). Certain types of talk therapy can be helpful in treating GAD by providing support, education, and guidance. A form of talk therapy called cognitive behavioral therapy can teach you healthy ways to think about and react to daily life events and activities.  Stress managementtechniques. These include yoga, meditation, and exercise and can be very helpful when they are practiced regularly. A mental health specialist can help determine which treatment is best for you. Some people see improvement with one therapy. However, other people require a combination of therapies.   This information is not intended to replace advice given to you by your health care provider. Make sure you discuss any questions you have with your health care provider.   Document Released: 11/13/2012 Document Revised: 08/09/2014 Document Reviewed: 11/13/2012 Elsevier Interactive Patient Education 2016 Elsevier Inc.  

## 2016-02-24 ENCOUNTER — Other Ambulatory Visit: Payer: Self-pay | Admitting: Physician Assistant

## 2016-02-24 DIAGNOSIS — E038 Other specified hypothyroidism: Secondary | ICD-10-CM

## 2016-02-24 LAB — VITAMIN D 25 HYDROXY (VIT D DEFICIENCY, FRACTURES): VIT D 25 HYDROXY: 60 ng/mL (ref 30–100)

## 2016-03-01 ENCOUNTER — Other Ambulatory Visit: Payer: Self-pay | Admitting: Physician Assistant

## 2016-03-01 DIAGNOSIS — E038 Other specified hypothyroidism: Secondary | ICD-10-CM

## 2016-03-19 ENCOUNTER — Other Ambulatory Visit: Payer: Self-pay | Admitting: Physician Assistant

## 2016-04-09 ENCOUNTER — Ambulatory Visit (INDEPENDENT_AMBULATORY_CARE_PROVIDER_SITE_OTHER): Payer: Managed Care, Other (non HMO) | Admitting: Internal Medicine

## 2016-04-09 ENCOUNTER — Encounter: Payer: Self-pay | Admitting: Internal Medicine

## 2016-04-09 VITALS — BP 130/84 | HR 77 | Ht 59.5 in | Wt 174.0 lb

## 2016-04-09 DIAGNOSIS — E039 Hypothyroidism, unspecified: Secondary | ICD-10-CM

## 2016-04-09 LAB — T3, FREE: T3, Free: 2.8 pg/mL (ref 2.3–4.2)

## 2016-04-09 LAB — T4, FREE: FREE T4: 0.79 ng/dL (ref 0.60–1.60)

## 2016-04-09 LAB — TSH: TSH: 1.73 u[IU]/mL (ref 0.35–4.50)

## 2016-04-09 MED ORDER — SYNTHROID 50 MCG PO TABS: 50.0000 ug | ORAL_TABLET | Freq: Every day | ORAL | 1 refills | Status: DC

## 2016-04-09 NOTE — Patient Instructions (Signed)
Please stop at the lab.  We may need to change the Synthroid doses after the results are back.  If the thyroid antibodies are high >> I would suggest Selenium 1 tablet 2x a day.  Please come back for a follow-up appointment in 4 months.    Hypothyroidism Hypothyroidism is a disorder of the thyroid. The thyroid is a large gland that is located in the lower front of the neck. The thyroid releases hormones that control how the body works. With hypothyroidism, the thyroid does not make enough of these hormones. CAUSES Causes of hypothyroidism may include:  Viral infections.  Pregnancy.  Your own defense system (immune system) attacking your thyroid.  Certain medicines.  Birth defects.  Past radiation treatments to your head or neck.  Past treatment with radioactive iodine.  Past surgical removal of part or all of your thyroid.  Problems with the gland that is located in the center of your brain (pituitary). SIGNS AND SYMPTOMS Signs and symptoms of hypothyroidism may include:  Feeling as though you have no energy (lethargy).  Inability to tolerate cold.  Weight gain that is not explained by a change in diet or exercise habits.  Dry skin.  Coarse hair.  Menstrual irregularity.  Slowing of thought processes.  Constipation.  Sadness or depression. DIAGNOSIS  Your health care provider may diagnose hypothyroidism with blood tests and ultrasound tests. TREATMENT Hypothyroidism is treated with medicine that replaces the hormones that your body does not make. After you begin treatment, it may take several weeks for symptoms to go away. HOME CARE INSTRUCTIONS   Take medicines only as directed by your health care provider.  If you start taking any new medicines, tell your health care provider.  Keep all follow-up visits as directed by your health care provider. This is important. As your condition improves, your dosage needs may change. You will need to have blood tests  regularly so that your health care provider can watch your condition. SEEK MEDICAL CARE IF:  Your symptoms do not get better with treatment.  You are taking thyroid replacement medicine and:  You sweat excessively.  You have tremors.  You feel anxious.  You lose weight rapidly.  You cannot tolerate heat.  You have emotional swings.  You have diarrhea.  You feel weak. SEEK IMMEDIATE MEDICAL CARE IF:   You develop chest pain.  You develop an irregular heartbeat.  You develop a rapid heartbeat.   This information is not intended to replace advice given to you by your health care provider. Make sure you discuss any questions you have with your health care provider.   Document Released: 07/19/2005 Document Revised: 08/09/2014 Document Reviewed: 12/04/2013 Elsevier Interactive Patient Education [employer].

## 2016-04-09 NOTE — Progress Notes (Addendum)
Patient ID: [name], female   DOB: 1951-01-23, 65 y.o.   MRN: [id number]    HPI  Michelle Miranda is a 65 y.o.-year-old female, referred by her PCP, Michelle Mutters, NP (Dr. Melford Aase), for management of hypothyroidism.  Pt. has been dx with hypothyroidism in 2012 >> prev. On Levothyroxine, then on Synthroid d.a.w. 37.5 mcg 6/7 and 75 mcg 1/7 (changed in 01/2016).  She takes the thyroid hormone: - fasting, at 5-6 am - with water - separated by >1h  from b'fast  - no calcium, iron, PPIs, multivitamins   I reviewed pt's thyroid tests: Lab Results  Component Value Date   TSH 5.15 (H) 02/23/2016   TSH 1.811 05/19/2015   TSH 2.250 02/11/2015   TSH 4.333 01/01/2015   TSH 3.618 06/10/2014   TSH 4.467 05/10/2014    Pt describes: - + heat intolerance - + weight gain and loss - + sleeps a lot if no alarm clock set - + fatigue - + depression and anxiety >> she is on Cymbalta - occassional constipation - + dry skin - + hair loss  Pt denies feeling nodules in neck, hoarseness, dysphagia/odynophagia, SOB with lying down.  She has + FH of thyroid disorders in: mother - hypothyroid. No FH of thyroid cancer.  No h/o radiation tx to head or neck. No recent use of iodine supplements.  She takes a vitamin D supplement of 5000 units daily.  ROS: Constitutional: + see HPI Eyes: no blurry vision, no xerophthalmia ENT: no sore throat, no nodules palpated in throat, no dysphagia/odynophagia, no hoarseness Cardiovascular: no CP/SOB/palpitations/leg swelling Respiratory: no cough/SOB Gastrointestinal: no N/V/D/C Musculoskeletal: no muscle/+ joint aches Skin: no rashes, + dry skin, + hair loss Neurological: no tremors/numbness/tingling/dizziness Psychiatric: + both: depression/anxiety  Past Medical History:  Diagnosis Date  . Arthritis   . Depression   . Hyperlipidemia   . Hypertension   . Hypothyroid   . Osteopenia   . Urinary tract infection 12-15-2011  . Vitamin D deficiency     Past Surgical History:  Procedure Laterality Date  . c sections  jan 1980 and march 1984  . ovarian cyst with lararoscopy march 1998    . TOTAL KNEE ARTHROPLASTY  01/03/2012   Procedure: TOTAL KNEE ARTHROPLASTY;  Surgeon: Gearlean Alf, MD;  Location: [facility];  Service: Orthopedics;  Laterality: Right;  . TOTAL KNEE ARTHROPLASTY Left 09/11/2012   Procedure: Left Total Knee Arthroplasty;  Surgeon: Gearlean Alf, MD;  Location: [facility];  Service: Orthopedics;  Laterality: Left;  . WISDOM TOOTH EXTRACTION  09-05-12   extractions   Social History   Social History  . Marital status: Married    Spouse name: N/A  . Number of children: 2   Occupational History  . Law office admin   Social History Main Topics  . Smoking status: Never Smoker  . Smokeless tobacco: Never Used  . Alcohol use Yes     Comment: 1-2 glass wine night  . Drug use: No   Current Outpatient Prescriptions on File Prior to Visit  Medication Sig Dispense Refill  . DULoxetine (CYMBALTA) 30 MG capsule Take 1 capsule (30 mg total) by mouth daily. 90 capsule 1  . raloxifene (EVISTA) 60 MG tablet TAKE 1 TABLET BY MOUTH EVERY DAY FOR OSTEOPOROSIS 30 tablet 9  . SYNTHROID 75 MCG tablet Take 0.5 tablets (37.5 mcg total) by mouth every morning. (Patient taking differently: Take 37.5 mcg by mouth every morning. ) 90 tablet 1  .  ALPRAZolam (XANAX) 0.5 MG tablet Take 1 tablet (0.5 mg total) by mouth 3 (three) times daily as needed for sleep or anxiety. (Patient not taking: Reported on 04/09/2016) 20 tablet 0   No current facility-administered medications on file prior to visit.    Allergies  Allergen Reactions  . Oxycodone     "Found Oxycodone not effective for pain control" with last RTKA   Family History  Problem Relation Age of Onset  . Hypertension Mother   . Thyroid disease Mother   . Arthritis Mother   . Cancer Mother   . COPD Father    PE: BP 130/84 (BP Location: Left Arm, Patient Position: Sitting)   Pulse 77    Ht 4' 11.5" (1.511 m)   Wt 174 lb (78.9 kg)   SpO2 97%   BMI 34.56 kg/m  Wt Readings from Last 3 Encounters:  04/09/16 174 lb (78.9 kg)  02/23/16 177 lb 6.4 oz (80.5 kg)  05/19/15 169 lb (76.7 kg)   Constitutional: overweight, in NAD Eyes: PERRLA, EOMI, no exophthalmos ENT: moist mucous membranes, no thyromegaly, no cervical lymphadenopathy Cardiovascular: RRR, No MRG Respiratory: CTA B Gastrointestinal: abdomen soft, NT, ND, BS+ Musculoskeletal: no deformities, strength intact in all 4 Skin: moist, warm, no rashes Neurological: no tremor with outstretched hands, DTR normal in all 4  ASSESSMENT: 1. Hypothyroidism  PLAN:  1. Patient with long-standing hypothyroidism, on levothyroxine therapy. Her LT4 dose was recently doubled 1/7 days. She still feels fatigued, maybe a little better after the change. We discussed that if her tests are normal today, we should probably switch to a more homogeneous regimen, taking the same dose every day. She agrees with this. - she appears euthyroid, but complains of fatigue. She is wondering whether Armour would help this. I explained that she is on such a low dose of levothyroxine, that her own thyroid is providing most of the thyroid hormones and I don't feel that Armour would be of great help at this point. She understands and agrees with this. - We discussed about correct intake of levothyroxine, fasting, with water, separated by at least 30 minutes from breakfast, and separated by more than 4 hours from calcium, iron, multivitamins, acid reflux medications (PPIs). She is taking it correctly. - will check thyroid tests today: TSH, free T4, TPO and ATA Abs. - If labs today are abnormal, she will need to return in 6 weeks for repeat labs - Otherwise, I will see her back in 4 months  Office Visit on 04/09/2016  Component Date Value Ref Range Status  . TSH 04/09/2016 1.73  0.35 - 4.50 uIU/mL Final  . Free T4 04/09/2016 0.79  0.60 - 1.60 ng/dL Final   . T3, Free 04/09/2016 2.8  2.3 - 4.2 pg/mL Final  . Thyroperoxidase Ab SerPl-aCnc 04/10/2016 9* <9 IU/mL Final  . Thyroglobulin Ab 04/10/2016 <1  <2 IU/mL Final   Thyroid antibodies mildly positive >> mild Hashimoto's thyroiditis.  We'll go ahead and change her Synthroid to the equivalent daily dose of approximately 50 g daily. I will send this to her pharmacy but would like her to come back for labs in 6 weeks to make sure this dose is correct.  Philemon Kingdom, MD PhD [facility]

## 2016-04-10 LAB — THYROID PEROXIDASE ANTIBODY: THYROID PEROXIDASE ANTIBODY: 9 [IU]/mL — AB (ref <9)

## 2016-04-10 LAB — THYROGLOBULIN ANTIBODY

## 2016-05-12 ENCOUNTER — Encounter: Payer: Self-pay | Admitting: Internal Medicine

## 2016-05-18 ENCOUNTER — Other Ambulatory Visit (INDEPENDENT_AMBULATORY_CARE_PROVIDER_SITE_OTHER): Payer: Managed Care, Other (non HMO)

## 2016-05-18 DIAGNOSIS — E039 Hypothyroidism, unspecified: Secondary | ICD-10-CM

## 2016-05-18 LAB — TSH: TSH: 2.02 u[IU]/mL (ref 0.35–4.50)

## 2016-05-18 LAB — T4, FREE: FREE T4: 0.7 ng/dL (ref 0.60–1.60)

## 2016-05-19 ENCOUNTER — Encounter: Payer: Self-pay | Admitting: Internal Medicine

## 2016-05-19 ENCOUNTER — Encounter: Payer: Self-pay | Admitting: Physician Assistant

## 2016-06-23 ENCOUNTER — Encounter: Payer: Self-pay | Admitting: Physician Assistant

## 2016-07-04 ENCOUNTER — Other Ambulatory Visit: Payer: Self-pay | Admitting: Internal Medicine

## 2016-07-06 ENCOUNTER — Ambulatory Visit (INDEPENDENT_AMBULATORY_CARE_PROVIDER_SITE_OTHER): Payer: Managed Care, Other (non HMO) | Admitting: Physician Assistant

## 2016-07-06 ENCOUNTER — Encounter: Payer: Self-pay | Admitting: Physician Assistant

## 2016-07-06 VITALS — BP 124/84 | HR 100 | Temp 97.5°F | Resp 14 | Ht 60.0 in | Wt 180.0 lb

## 2016-07-06 DIAGNOSIS — Z0000 Encounter for general adult medical examination without abnormal findings: Secondary | ICD-10-CM | POA: Diagnosis not present

## 2016-07-06 DIAGNOSIS — Z79899 Other long term (current) drug therapy: Secondary | ICD-10-CM

## 2016-07-06 DIAGNOSIS — I10 Essential (primary) hypertension: Secondary | ICD-10-CM

## 2016-07-06 DIAGNOSIS — Z136 Encounter for screening for cardiovascular disorders: Secondary | ICD-10-CM | POA: Diagnosis not present

## 2016-07-06 DIAGNOSIS — E559 Vitamin D deficiency, unspecified: Secondary | ICD-10-CM

## 2016-07-06 DIAGNOSIS — E785 Hyperlipidemia, unspecified: Secondary | ICD-10-CM

## 2016-07-06 DIAGNOSIS — R7303 Prediabetes: Secondary | ICD-10-CM

## 2016-07-06 DIAGNOSIS — M8580 Other specified disorders of bone density and structure, unspecified site: Secondary | ICD-10-CM

## 2016-07-06 DIAGNOSIS — E038 Other specified hypothyroidism: Secondary | ICD-10-CM

## 2016-07-06 DIAGNOSIS — M170 Bilateral primary osteoarthritis of knee: Secondary | ICD-10-CM

## 2016-07-06 DIAGNOSIS — F325 Major depressive disorder, single episode, in full remission: Secondary | ICD-10-CM

## 2016-07-06 DIAGNOSIS — Z0001 Encounter for general adult medical examination with abnormal findings: Secondary | ICD-10-CM

## 2016-07-06 LAB — CBC WITH DIFFERENTIAL/PLATELET
BASOS ABS: 86 {cells}/uL (ref 0–200)
Basophils Relative: 1 %
EOS ABS: 258 {cells}/uL (ref 15–500)
EOS PCT: 3 %
HCT: 40.9 % (ref 35.0–45.0)
HEMOGLOBIN: 13.4 g/dL (ref 11.7–15.5)
LYMPHS ABS: 2752 {cells}/uL (ref 850–3900)
Lymphocytes Relative: 32 %
MCH: 31.1 pg (ref 27.0–33.0)
MCHC: 32.8 g/dL (ref 32.0–36.0)
MCV: 94.9 fL (ref 80.0–100.0)
MPV: 9.2 fL (ref 7.5–12.5)
Monocytes Absolute: 946 cells/uL (ref 200–950)
Monocytes Relative: 11 %
NEUTROS ABS: 4558 {cells}/uL (ref 1500–7800)
Neutrophils Relative %: 53 %
Platelets: 323 10*3/uL (ref 140–400)
RBC: 4.31 MIL/uL (ref 3.80–5.10)
RDW: 13.1 % (ref 11.0–15.0)
WBC: 8.6 10*3/uL (ref 3.8–10.8)

## 2016-07-06 LAB — TSH: TSH: 2.08 mIU/L

## 2016-07-06 NOTE — Patient Instructions (Addendum)
Add ENTERIC COATED low dose 81 mg Aspirin daily OR can do every other day if you have easy bruising to protect your heart and head. As well as to reduce risk of Colon Cancer by 20 %, Skin Cancer by 26 % , Melanoma by 46% and Pancreatic cancer by 60%  Get colonoscopy  Will get PAP next year  We want weight loss that will last so you should lose 1-2 pounds a week.  THAT IS IT! Please pick THREE things a month to change. Once it is a habit check off the item. Then pick another three items off the list to become habits.  If you are already doing a habit on the list GREAT!  Cross that item off! o Don't drink your calories. Ie, alcohol, soda, fruit juice, and sweet tea.  o Drink more water. Drink a glass when you feel hungry or before each meal.  o Eat breakfast - Complex carb and protein (likeDannon light and fit yogurt, oatmeal, fruit, eggs, [place] bacon). o Measure your cereal.  Eat no more than one cup a day. (ie [place]) o Eat an apple a day. o Add a vegetable a day. o Try a new vegetable a month. o Use Pam! Stop using oil or butter to cook. o Don't finish your plate or use smaller plates. o Share your dessert. o Eat sugar free Jello for dessert or frozen grapes. o Don't eat 2-3 hours before bed. o Switch to whole wheat bread, pasta, and brown rice. o Make healthier choices when you eat out. No fries! o Pick baked chicken, NOT fried. o Don't forget to SLOW DOWN when you eat. It is not going anywhere.  o Take the stairs. o Park far away in the parking lot o [employer] (or weights) for 10 minutes while watching TV. o Walk at work for 10 minutes during break. o Walk outside 1 time a week with your friend, kids, dog, or significant other. o Start a walking group at [place] the mall as much as you can tolerate.  o Keep a food diary. o Weigh yourself daily. o Walk for 15 minutes 3 days per week. o Cook at home more often and eat out less.  If life happens and you go back to old  habits, it is okay.  Just start over. You can do it!   If you experience chest pain, get short of breath, or tired during the exercise, please stop immediately and inform your doctor.

## 2016-07-06 NOTE — Progress Notes (Signed)
Complete Physical  Assessment and Plan:  Essential hypertension - continue medications, DASH diet, exercise and monitor at home. Call if greater than 130/80.  - CBC with Differential/Platelet - BASIC METABOLIC PANEL WITH GFR - Hepatic function panel - Urinalysis, Routine w reflex microscopic (not at [facility]) - Microalbumin / creatinine urine ratio  Other specified hypothyroidism Hypothyroidism-check TSH level, continue medications the same, reminded to take on an empty stomach 30-7mins before food.  - TSH  Hyperlipidemia -continue medications, check lipids, decrease fatty foods, increase activity.  - Lipid panel  Obesity Obesity with co morbidities- long discussion about weight loss, diet, and exercise  Depression, major, in remission ([place]) remission   Prediabetes Discussed general issues about diabetes pathophysiology and management., Educational material distributed., Suggested low cholesterol diet., Encouraged aerobic exercise., Discussed foot care., Reminded to get yearly retinal exam. - Hemoglobin A1c - Insulin, fasting   Vitamin D deficiency - Vit D  25 hydroxy (rtn osteoporosis monitoring)  Osteoarthritis of both knees, unspecified osteoarthritis type Weight loss advised, better after surgery  Arthritis Weight loss advised, NSAIDS PRN   Osteopenia - DG Bone Density; Future  Medication management - Magnesium  Anemia, unspecified anemia type - Iron and TIBC - Ferritin - Vitamin B12  15. Encounter for general adult medical examination with abnormal findings Check on MGM, get if not done TDAP, PAP, colonoscopy next year.  - CBC with Differential/Platelet - BASIC METABOLIC PANEL WITH GFR - Hepatic function panel - Lipid panel - Hemoglobin A1c - Insulin, fasting - Magnesium - Vit D  25 hydroxy (rtn osteoporosis monitoring) - Urinalysis, Routine w reflex microscopic (not at [facility]) - Microalbumin / creatinine urine ratio - Iron and TIBC - Ferritin -  Vitamin B12    Discussed med's effects and SE's. Screening labs and tests as requested with regular follow-up as recommended.  HPI 65 y.o. female  presents for a complete physical.  Her blood pressure has been controlled at home, today their BP is BP: 124/84 She does not workout. She denies chest pain, shortness of breath, dizziness.  She is not on cholesterol medication and denies myalgias. Her cholesterol is at goal. The cholesterol last visit was:   Lab Results  Component Value Date   CHOL 210 (H) 02/23/2016   HDL 107 02/23/2016   LDLCALC 88 02/23/2016   TRIG 73 02/23/2016   CHOLHDL 2.0 02/23/2016   She has not been working on diet and exercise for prediabetes, and denies polydipsia, polyuria and visual disturbances. Last A1C in the office was:  Lab Results  Component Value Date   HGBA1C 5.8 (H) 02/23/2016   Patient is on Vitamin D supplement, she is on 5000 IU daily.  Lab Results  Component Value Date   VD25OH 60 02/23/2016   She is on thyroid medication. Her medication was changed last visit, she is on 58mcg daily. and added selenium for thyroid. Patient denies nervousness and palpitations. Her last TSH was  Lab Results  Component Value Date   TSH 2.02 05/18/2016   + vaginal dryness but albolene helps.  BMI is Body mass index is 35.15 kg/m., she is struggling with weight loss due to stress.  Wt Readings from Last 3 Encounters:  07/06/16 180 lb (81.6 kg)  04/09/16 174 lb (78.9 kg)  02/23/16 177 lb 6.4 oz (80.5 kg)    Current Medications:  Current Outpatient Prescriptions on File Prior to Visit  Medication Sig Dispense Refill  . ALPRAZolam (XANAX) 0.5 MG tablet Take 1 tablet (0.5 mg total)  by mouth 3 (three) times daily as needed for sleep or anxiety. 20 tablet 0  . DULoxetine (CYMBALTA) 30 MG capsule Take 1 capsule (30 mg total) by mouth daily. 90 capsule 1  . raloxifene (EVISTA) 60 MG tablet TAKE 1 TABLET BY MOUTH EVERY DAY FOR OSTEOPOROSIS 30 tablet 9  .  SYNTHROID 50 MCG tablet TAKE 1 TABLET BY MOUTH EVERY MORNING BEFORE BREAKFAST 45 tablet 1   No current facility-administered medications on file prior to visit.    Health Maintenance:   Immunization History  Administered Date(s) Administered  . Influenza Split 05/10/2014, 05/19/2015  . Td 06/02/2006   Tetanus: 2007 Pneumovax: N/A Prevnar 13: age 5 due Flu vaccine: 2017 Zostavax: declines  Pap: 2014 will get next year, never abnormal MGM: 07/2015, due DEXA: 07/2015 due 2018, on evista Colonoscopy: 2007 due 2017 has appointment with new GI, [place].  EGD: N/A CTA 09/2012  [name], [name] [name], MD as Consulting Physician (Orthopedic Surgery) [name], MD as Consulting Physician (Ophthalmology) [name], MD as Consulting Physician (Gastroenterology)   Medical History:  Past Medical History:  Diagnosis Date  . Arthritis   . Depression   . Hyperlipidemia   . Hypertension   . Hypothyroid   . Osteopenia   . Urinary tract infection 12-15-2011  . Vitamin D deficiency    Allergies Allergies  Allergen Reactions  . Oxycodone     "Found Oxycodone not effective for pain control" with last RTKA    SURGICAL HISTORY She  has a past surgical history that includes c sections (jan 1980 and march 1984); ovarian cyst with lararoscopy march 1998; Total knee arthroplasty (01/03/2012); Wisdom tooth extraction (09-05-12); and Total knee arthroplasty (Left, 09/11/2012). FAMILY HISTORY Her family history includes Arthritis in her mother; COPD in her father; Cancer in her mother; Hypertension in her mother; Thyroid disease in her mother. SOCIAL HISTORY She  reports that she has never smoked. She has never used smokeless tobacco. She reports that she drinks alcohol. She reports that she does not use drugs.  Review of Systems  Constitutional: Positive for malaise/fatigue. Negative for chills, diaphoresis, fever and  weight loss.  Eyes: Negative.   Respiratory: Negative for cough, hemoptysis, sputum production, shortness of breath, wheezing and stridor.   Cardiovascular: Negative.  Negative for claudication.  Gastrointestinal: Negative.   Genitourinary: Negative.   Musculoskeletal: Negative.   Skin: Negative for itching and rash.  Neurological: Negative.  Negative for weakness.  Endo/Heme/Allergies: Negative.   Psychiatric/Behavioral: Negative.     Physical Exam: Estimated body mass index is 35.15 kg/m as calculated from the following:   Height as of this encounter: 5' (1.524 m).   Weight as of this encounter: 180 lb (81.6 kg). BP 124/84   Pulse 100   Temp 97.5 F (36.4 C)   Resp 14   Ht 5' (1.524 m)   Wt 180 lb (81.6 kg)   SpO2 98%   BMI 35.15 kg/m  General Appearance: Well nourished, in no apparent distress. Eyes: PERRLA, EOMs, conjunctiva no swelling or erythema, normal fundi and vessels. Sinuses: No Frontal/maxillary tenderness ENT/Mouth: Ext aud canals clear, normal light reflex with TMs without erythema, bulging.  Good dentition. No erythema, swelling, or exudate on post pharynx. Tonsils not swollen or erythematous. Hearing normal.  Neck: Supple, thyroid normal. No bruits Respiratory: Respiratory effort normal, BS equal bilaterally without rales, rhonchi, wheezing or stridor. Cardio: RRR without murmurs, rubs or gallops. Brisk peripheral pulses  without edema.  Chest: symmetric, with normal excursions and percussion. Breasts: Symmetric, without masses, without nipple discharge, retractions. Abdomen: Soft, +BS. Non tender, no guarding, rebound, hernias, masses, or organomegaly. .  Lymphatics: Non tender without lymphadenopathy.  Genitourinary: defer until next year Musculoskeletal: Full ROM all peripheral extremities,5/5 strength, and normal gait. Skin: Warm, dry without rashes, lesions, ecchymosis.  Neuro: Cranial nerves intact, reflexes equal bilaterally. Normal muscle tone, no  cerebellar symptoms. Sensation intact.  Psych: Awake and oriented X 3, normal affect, Insight and Judgment appropriate.   EKG:  WNL AORTA SCAN: defer   Vicie Mutters 3:18 PM

## 2016-07-07 LAB — BASIC METABOLIC PANEL WITH GFR
BUN: 14 mg/dL (ref 7–25)
CHLORIDE: 103 mmol/L (ref 98–110)
CO2: 24 mmol/L (ref 20–31)
CREATININE: 0.87 mg/dL (ref 0.50–0.99)
Calcium: 8.7 mg/dL (ref 8.6–10.4)
GFR, Est African American: 82 mL/min (ref >=60)
GFR, Est Non African American: 71 mL/min (ref >=60)
Glucose, Bld: 95 mg/dL (ref 65–99)
POTASSIUM: 4.1 mmol/L (ref 3.5–5.3)
SODIUM: 138 mmol/L (ref 135–146)

## 2016-07-07 LAB — LIPID PANEL
CHOL/HDL RATIO: 2.1 ratio (ref <5.0)
Cholesterol: 195 mg/dL (ref <200)
HDL: 92 mg/dL (ref >50)
LDL CALC: 85 mg/dL (ref <100)
Triglycerides: 92 mg/dL (ref <150)
VLDL: 18 mg/dL (ref <30)

## 2016-07-07 LAB — HEPATIC FUNCTION PANEL
ALK PHOS: 67 U/L (ref 33–130)
ALT: 21 U/L (ref 6–29)
AST: 25 U/L (ref 10–35)
Albumin: 4.1 g/dL (ref 3.6–5.1)
BILIRUBIN DIRECT: 0.1 mg/dL (ref <=0.2)
BILIRUBIN INDIRECT: 0.3 mg/dL (ref 0.2–1.2)
Total Bilirubin: 0.4 mg/dL (ref 0.2–1.2)
Total Protein: 6.8 g/dL (ref 6.1–8.1)

## 2016-07-07 LAB — VITAMIN D 25 HYDROXY (VIT D DEFICIENCY, FRACTURES): Vit D, 25-Hydroxy: 57 ng/mL (ref 30–100)

## 2016-07-07 LAB — URINALYSIS, ROUTINE W REFLEX MICROSCOPIC
BILIRUBIN URINE: NEGATIVE
Glucose, UA: NEGATIVE
Hgb urine dipstick: NEGATIVE
Leukocytes, UA: NEGATIVE
Nitrite: NEGATIVE
PH: 5.5 (ref 5.0–8.0)
Protein, ur: NEGATIVE
SPECIFIC GRAVITY, URINE: 1.02 (ref 1.001–1.035)

## 2016-07-07 LAB — MICROALBUMIN / CREATININE URINE RATIO
Creatinine, Urine: 147 mg/dL (ref 20–320)
Microalb Creat Ratio: 3 mcg/mg creat (ref <30)
Microalb, Ur: 0.4 mg/dL

## 2016-07-07 LAB — HEMOGLOBIN A1C
HEMOGLOBIN A1C: 5.6 % (ref <5.7)
MEAN PLASMA GLUCOSE: 114 mg/dL

## 2016-07-07 LAB — MAGNESIUM: Magnesium: 2.2 mg/dL (ref 1.5–2.5)

## 2016-07-18 NOTE — Addendum Note (Signed)
Addended by: Karrington Studnicka A on: 07/18/2016 05:06 PM   Modules accepted: Orders

## 2016-07-19 ENCOUNTER — Other Ambulatory Visit: Payer: Self-pay | Admitting: Internal Medicine

## 2016-08-09 ENCOUNTER — Ambulatory Visit (INDEPENDENT_AMBULATORY_CARE_PROVIDER_SITE_OTHER): Payer: Managed Care, Other (non HMO) | Admitting: Internal Medicine

## 2016-08-09 ENCOUNTER — Encounter: Payer: Self-pay | Admitting: Internal Medicine

## 2016-08-09 VITALS — BP 124/84 | HR 99 | Wt 184.0 lb

## 2016-08-09 DIAGNOSIS — E063 Autoimmune thyroiditis: Secondary | ICD-10-CM | POA: Diagnosis not present

## 2016-08-09 DIAGNOSIS — E038 Other specified hypothyroidism: Secondary | ICD-10-CM | POA: Diagnosis not present

## 2016-08-09 NOTE — Patient Instructions (Addendum)
Please continue Synthroid 50 mcg daily.  Take the thyroid hormone every day, with water, at least 30 minutes before breakfast, separated by at least 4 hours from: - acid reflux medications - calcium - iron - multivitamins  Try Qilib for hair.  Please come back for a follow-up appointment in 6 months.

## 2016-08-09 NOTE — Progress Notes (Signed)
Patient ID: Michelle Miranda, female   DOB: [birth year], 66 y.o.   MRN: [id number]    HPI  Michelle Miranda is a 66 y.o.-year-old female, initially referred by her PCP, Vicie Mutters, NP (Dr. Melford Aase), for management of hypothyroidism. Last visit 4 months ago.  Pt. has been dx with hypothyroidism in 2012 and the Hashimoto's thyroiditis in 04/2016 >> prev. On Levothyroxine, then on Synthroid d.a.w. 37.5 mcg 6/7 and 75 mcg 1/7 (changed in 01/2016). At last visit, we changed to 50 g daily. Subsequent TFTs were normal.  She takes the thyroid hormone: - fasting, at 5-6 am - with water - separated by >1h  from b'fast  - no calcium, iron, PPIs, multivitamins   I reviewed pt's thyroid tests: Lab Results  Component Value Date   TSH 2.08 07/06/2016   TSH 2.02 05/18/2016   TSH 1.73 04/09/2016   TSH 5.15 (H) 02/23/2016   TSH 1.811 05/19/2015   TSH 2.250 02/11/2015   TSH 4.333 01/01/2015   TSH 3.618 06/10/2014   TSH 4.467 05/10/2014   FREET4 0.70 05/18/2016   FREET4 0.79 04/09/2016    At last visit, patient's thyroid antibodies were elevated, confirming Hashimoto thyroiditis. Component     Latest Ref Rng & Units 04/09/2016  Thyroperoxidase Ab SerPl-aCnc     <9 IU/mL 9 (H)  Thyroglobulin Ab     <2 IU/mL <1   Pt describes: - + fatigue, but a little better - + improved heat intolerance - + weight gain  - + sleeps a lot if no alarm clock set - improved depression and anxiety >> she is on Cymbalta - occassional constipation - + hair loss  She goes to Pilates every.  Pt denies feeling nodules in neck, hoarseness, dysphagia/odynophagia, SOB with lying down.  She has + FH of thyroid disorders in: mother - hypothyroid. No FH of thyroid cancer.  No h/o radiation tx to head or neck. No recent use of iodine supplements.  She takes a vitamin D supplement of 5000 units daily.  ROS: Constitutional: + see HPI Eyes: no blurry vision, no xerophthalmia ENT: no sore throat, no nodules palpated in  throat, no dysphagia/odynophagia, no hoarseness Cardiovascular: no CP/SOB/palpitations/leg swelling Respiratory: no cough/SOB Gastrointestinal: no N/V/D/C Musculoskeletal: no muscle/joint aches Skin: no rashes, + hair loss Neurological: no tremors/numbness/tingling/dizziness  I reviewed pt's medications, allergies, PMH, social hx, family hx, and changes were documented in the history of present illness. Otherwise, unchanged from my initial visit note.  Past Medical History:  Diagnosis Date  . Arthritis   . Depression   . Hyperlipidemia   . Hypertension   . Hypothyroid   . Osteopenia   . Urinary tract infection 12-15-2011  . Vitamin D deficiency    Past Surgical History:  Procedure Laterality Date  . c sections  jan 1980 and march 1984  . ovarian cyst with lararoscopy march 1998    . TOTAL KNEE ARTHROPLASTY  01/03/2012   Procedure: TOTAL KNEE ARTHROPLASTY;  Surgeon: Gearlean Alf, MD;  Location: [facility];  Service: Orthopedics;  Laterality: Right;  . TOTAL KNEE ARTHROPLASTY Left 09/11/2012   Procedure: Left Total Knee Arthroplasty;  Surgeon: Gearlean Alf, MD;  Location: [facility];  Service: Orthopedics;  Laterality: Left;  . WISDOM TOOTH EXTRACTION  09-05-12   extractions   Social History   Social History  . Marital status: Married    Spouse name: N/A  . Number of children: 2   Occupational History  . Law office admin  Social History Main Topics  . Smoking status: Never Smoker  . Smokeless tobacco: Never Used  . Alcohol use Yes     Comment: 1-2 glass wine night  . Drug use: No   Current Outpatient Prescriptions on File Prior to Visit  Medication Sig Dispense Refill  . ALPRAZolam (XANAX) 0.5 MG tablet Take 1 tablet (0.5 mg total) by mouth 3 (three) times daily as needed for sleep or anxiety. 20 tablet 0  . DULoxetine (CYMBALTA) 30 MG capsule TAKE 1 CAPSULE (30 MG TOTAL) BY MOUTH DAILY. 90 capsule 1  . raloxifene (EVISTA) 60 MG tablet TAKE 1 TABLET BY MOUTH EVERY DAY FOR  OSTEOPOROSIS 30 tablet 9  . SYNTHROID 50 MCG tablet TAKE 1 TABLET BY MOUTH EVERY MORNING BEFORE BREAKFAST 45 tablet 1   No current facility-administered medications on file prior to visit.    Allergies  Allergen Reactions  . Oxycodone     "Found Oxycodone not effective for pain control" with last RTKA   Family History  Problem Relation Age of Onset  . Hypertension Mother   . Thyroid disease Mother   . Arthritis Mother   . Cancer Mother   . COPD Father    PE: BP 124/84 (BP Location: Left Arm, Patient Position: Sitting)   Pulse 99   Wt 184 lb (83.5 kg)   SpO2 98%   BMI 35.94 kg/m  Wt Readings from Last 3 Encounters:  08/09/16 184 lb (83.5 kg)  07/06/16 180 lb (81.6 kg)  04/09/16 174 lb (78.9 kg)   Constitutional: obese, in NAD Eyes: PERRLA, EOMI, no exophthalmos ENT: moist mucous membranes, no thyromegaly, no cervical lymphadenopathy Cardiovascular: RRR, No MRG Respiratory: CTA B Gastrointestinal: abdomen soft, NT, ND, BS+ Musculoskeletal: no deformities, strength intact in all 4 Skin: moist, warm, no rashes Neurological: no tremor with outstretched hands, DTR normal in all 4  ASSESSMENT: 1. Hypothyroidism 2/2 Hashimoto's thyroiditis  PLAN:  1. Patient with long-standing hypothyroidism, characterized as Hashimoto's hypothyroidism at last visit, since her thyroid antibodies were positive. She is on levothyroxine therapy, change to 50 g daily at last visit. Subsequent TFTs were normal. - She feels better on this dose (she was previously fluctuating between different doses) - she appears euthyroid, but still has fatigue >> discussed about stating exercise.  - At last visit, we discussed about Armour and I explained that since she is on such a low dose of levothyroxine, that is a sign that her own thyroid is providing most of the thyroid hormones and I don't feel that Armour would be of great help at this point.  - We discussed about correct intake of levothyroxine,  fasting, with water, separated by at least 30 minutes from breakfast, and separated by more than 4 hours from calcium, iron, multivitamins, acid reflux medications (PPIs). She is taking it correctly. - reviewed her previous set of TFTs >> normal >> will repeat at next visit - I will see her back in 6 months  Philemon Kingdom, MD PhD [facility] Endocrinology

## 2016-09-10 ENCOUNTER — Other Ambulatory Visit: Payer: Self-pay | Admitting: Physician Assistant

## 2016-09-10 DIAGNOSIS — Z1231 Encounter for screening mammogram for malignant neoplasm of breast: Secondary | ICD-10-CM

## 2016-09-17 ENCOUNTER — Ambulatory Visit: Payer: Managed Care, Other (non HMO)

## 2016-10-01 ENCOUNTER — Ambulatory Visit
Admission: RE | Admit: 2016-10-01 | Discharge: 2016-10-01 | Disposition: A | Discharge status: Home / Self Care | Payer: Managed Care, Other (non HMO) | Source: Ambulatory Visit | Attending: Physician Assistant | Admitting: Physician Assistant

## 2016-10-01 DIAGNOSIS — Z1231 Encounter for screening mammogram for malignant neoplasm of breast: Secondary | ICD-10-CM

## 2016-10-07 ENCOUNTER — Other Ambulatory Visit: Payer: Self-pay | Admitting: Internal Medicine

## 2016-12-29 ENCOUNTER — Other Ambulatory Visit: Payer: Self-pay | Admitting: Internal Medicine

## 2017-01-05 ENCOUNTER — Other Ambulatory Visit: Payer: Self-pay | Admitting: Physician Assistant

## 2017-01-06 ENCOUNTER — Other Ambulatory Visit: Payer: Self-pay | Admitting: Internal Medicine

## 2017-01-10 ENCOUNTER — Ambulatory Visit: Payer: Self-pay | Admitting: Physician Assistant

## 2017-01-17 ENCOUNTER — Ambulatory Visit: Payer: Self-pay | Admitting: Physician Assistant

## 2017-02-04 ENCOUNTER — Encounter: Payer: Self-pay | Admitting: Podiatry

## 2017-02-04 ENCOUNTER — Ambulatory Visit (INDEPENDENT_AMBULATORY_CARE_PROVIDER_SITE_OTHER): Payer: Managed Care, Other (non HMO) | Admitting: Podiatry

## 2017-02-04 VITALS — BP 148/82 | HR 98

## 2017-02-04 DIAGNOSIS — L989 Disorder of the skin and subcutaneous tissue, unspecified: Secondary | ICD-10-CM

## 2017-02-04 DIAGNOSIS — B070 Plantar wart: Secondary | ICD-10-CM | POA: Diagnosis not present

## 2017-02-04 NOTE — Progress Notes (Signed)
   Subjective:    Patient ID: Michelle Miranda, female    DOB: 02/25/1951, 66 y.o.   MRN: [id number]  HPI this patient presents to the office with chief complaint of a callus on the outside bottom of her left foot. She says this callus has been present for approximately 2 months and is enlarging in size. She denies stepping on any foreign body at this site. She says she has provided no self treatment nor sought any professional help. She presents the office today for an evaluation and treatment of this callus, left foot    Review of Systems  Constitutional: Positive for fatigue.       Sweating  Gastrointestinal: Positive for constipation.  All other systems reviewed and are negative.      Objective:   Physical Exam GENERAL APPEARANCE: Alert, conversant. Appropriately groomed. No acute distress.  VASCULAR: Pedal pulses are  palpable at  [facility] and PT bilateral.  Capillary refill time is immediate to all digits,  Normal temperature gradient.  Digital hair growth is present bilateral  NEUROLOGIC: sensation is normal to 5.07 monofilament at 5/5 sites bilateral.  Light touch is intact bilateral, Muscle strength normal.  MUSCULOSKELETAL: acceptable muscle strength, tone and stability bilateral.  Intrinsic muscluature intact bilateral.  Rectus appearance of foot and digits noted bilateral.   DERMATOLOGIC: skin color, texture, and turgor are within normal limits.  No preulcerative lesions or ulcers  are seen, no interdigital maceration noted.  No open lesions present.  Digital nails are asymptomatic. No drainage noted. Well circumscribed, cauliflower-appearing lesion sub-fifth meta-base left foot.  This lesion bleeds upon debridement.         Assessment & Plan:  Benign skin lesion.  Verrucae left foot.  IE  Application  Acid left foot.  RTC 1 week.   Gardiner Barefoot DPM

## 2017-02-10 ENCOUNTER — Ambulatory Visit (INDEPENDENT_AMBULATORY_CARE_PROVIDER_SITE_OTHER): Payer: Managed Care, Other (non HMO) | Admitting: Internal Medicine

## 2017-02-10 ENCOUNTER — Encounter: Payer: Self-pay | Admitting: Internal Medicine

## 2017-02-10 VITALS — BP 132/82 | HR 83 | Wt 181.0 lb

## 2017-02-10 DIAGNOSIS — E038 Other specified hypothyroidism: Secondary | ICD-10-CM | POA: Diagnosis not present

## 2017-02-10 DIAGNOSIS — E063 Autoimmune thyroiditis: Secondary | ICD-10-CM | POA: Diagnosis not present

## 2017-02-10 LAB — T4, FREE: Free T4: 0.74 ng/dL (ref 0.60–1.60)

## 2017-02-10 LAB — TSH: TSH: 1.62 u[IU]/mL (ref 0.35–4.50)

## 2017-02-10 NOTE — Patient Instructions (Addendum)
Please stop at the lab.  Please continue Synthroid 50 mcg daily.  Take the thyroid hormone every day, with water, at least 30 minutes before breakfast, separated by at least 4 hours from: - acid reflux medications - calcium - iron - multivitamins  Please increase Selenium to 200 mcg daily.  Start a B complex >> stop this for 3 days before next thyroid labs.  Please return in 1 year.

## 2017-02-10 NOTE — Progress Notes (Signed)
Patient ID: Michelle Miranda, female   DOB: 13-Jan-1951, 66 y.o.   MRN: [id number]    HPI  Michelle Miranda is a 66 y.o.-year-old female, initially referred by her PCP, Vicie Mutters, NP (Dr. Melford Aase), returning for follow-up for Hashimoto's hypothyroidism. Last visit 6 mo ago.  Reviewed and addended history: Pt. has been dx with hypothyroidism in 2012 and Hashimoto's thyroiditis in 04/2016 >> prev. On Levothyroxine, then on Synthroid d.a.w. 37.5 mcg 6/7 and 75 mcg 1/7 (changed in 01/2016).  Last thyroid dose change was to 50 g daily and she has maintained normal TFTs afterwards.  Pt takes levothyroxine DAW: - in am, at 4-6 AM - + coffee + creamer 1h later - fasting - no Ca, Fe, PPIs - on MVI - with first meal of the day, > 4h apart - not on Biotin  On Selenium 100 mcg daily. Added vitamin D3 5000 IU daily.  I reviewed pt's thyroid tests: Lab Results  Component Value Date   TSH 2.08 07/06/2016   TSH 2.02 05/18/2016   TSH 1.73 04/09/2016   TSH 5.15 (H) 02/23/2016   TSH 1.811 05/19/2015   TSH 2.250 02/11/2015   TSH 4.333 01/01/2015   TSH 3.618 06/10/2014   TSH 4.467 05/10/2014   FREET4 0.70 05/18/2016   FREET4 0.79 04/09/2016    Thyroid Ab's were elevated: Component     Latest Ref Rng & Units 04/09/2016  Thyroperoxidase Ab SerPl-aCnc     <9 IU/mL 9 (H)  Thyroglobulin Ab     <2 IU/mL <1   Pt denies: - feeling nodules in neck - hoarseness - dysphagia - choking - SOB with lying down  She has + FH of thyroid disorders in: mother - hypothyroid. No FH of thyroid cancer. No h/o radiation tx to head or neck.  No seaweed or kelp. No recent contrast studies. No herbal supplements. No Biotin use. No recent steroids use.    ROS: Constitutional: + weight gain, + fatigue, + subjective hyperthermia Eyes: no blurry vision, no xerophthalmia ENT: no sore throat, no nodules palpated in throat, no dysphagia, no odynophagia, no hoarseness Cardiovascular: no CP/no SOB/no palpitations/no  leg swelling Respiratory: no cough/no SOB/no wheezing Gastrointestinal: no N/no V/no D/+ C/no acid reflux Musculoskeletal: no muscle aches/no joint aches Skin: no rashes, + hair loss Neurological: + tremors/no numbness/no tingling/no dizziness  I reviewed pt's medications, allergies, PMH, social hx, family hx, and changes were documented in the history of present illness. Otherwise, unchanged from my initial visit note.  Past Medical History:  Diagnosis Date  . Arthritis   . Depression   . Hyperlipidemia   . Hypertension   . Hypothyroid   . Osteopenia   . Urinary tract infection 12-15-2011  . Vitamin D deficiency    Past Surgical History:  Procedure Laterality Date  . c sections  jan 1980 and march 1984  . ovarian cyst with lararoscopy march 1998    . TOTAL KNEE ARTHROPLASTY  01/03/2012   Procedure: TOTAL KNEE ARTHROPLASTY;  Surgeon: Gearlean Alf, MD;  Location: [facility];  Service: Orthopedics;  Laterality: Right;  . TOTAL KNEE ARTHROPLASTY Left 09/11/2012   Procedure: Left Total Knee Arthroplasty;  Surgeon: Gearlean Alf, MD;  Location: [facility];  Service: Orthopedics;  Laterality: Left;  . WISDOM TOOTH EXTRACTION  09-05-12   extractions   Social History   Social History  . Marital status: Married    Spouse name: N/A  . Number of children: 2   Occupational History  .  Law office admin   Social History Main Topics  . Smoking status: Never Smoker  . Smokeless tobacco: Never Used  . Alcohol use Yes     Comment: 1-2 glass wine night  . Drug use: No   Current Outpatient Prescriptions on File Prior to Visit  Medication Sig Dispense Refill  . ALPRAZolam (XANAX) 0.5 MG tablet Take 1 tablet (0.5 mg total) by mouth 3 (three) times daily as needed for sleep or anxiety. 20 tablet 0  . DULoxetine (CYMBALTA) 30 MG capsule TAKE 1 CAPSULE (30 MG TOTAL) BY MOUTH DAILY. 90 capsule 1  . raloxifene (EVISTA) 60 MG tablet TAKE 1 TABLET BY MOUTH EVERY DAY FOR OSTEOPOROSIS 30 tablet 9  .  SYNTHROID 50 MCG tablet TAKE 1 TABLET BY MOUTH EVERY MORNING BEFORE BREAKFAST 45 tablet 1   No current facility-administered medications on file prior to visit.    Allergies  Allergen Reactions  . Oxycodone     "Found Oxycodone not effective for pain control" with last RTKA  . Other     Opiods,causes severe anxiety   Family History  Problem Relation Age of Onset  . Hypertension Mother   . Thyroid disease Mother   . Arthritis Mother   . Cancer Mother   . COPD Father   . Breast cancer Maternal Grandmother   . Breast cancer Paternal Grandmother    PE: BP 132/82 (BP Location: Left Arm, Patient Position: Sitting)   Pulse 83   Wt 181 lb (82.1 kg)   SpO2 95%   BMI 35.35 kg/m  Wt Readings from Last 3 Encounters:  02/10/17 181 lb (82.1 kg)  08/09/16 184 lb (83.5 kg)  07/06/16 180 lb (81.6 kg)   Constitutional: overweight, in NAD Eyes: PERRLA, EOMI, no exophthalmos ENT: moist mucous membranes, no thyromegaly, no cervical lymphadenopathy Cardiovascular: RRR, No MRG Respiratory: CTA B Gastrointestinal: abdomen soft, NT, ND, BS+ Musculoskeletal: no deformities, strength intact in all 4 Skin: moist, warm, no rashes Neurological: + tremor with outstretched hands (chronic), DTR normal in all 4  ASSESSMENT: 1. Hypothyroidism 2/2 Hashimoto's thyroiditis  PLAN:  1. Patient with long-standing hypothyroidism With high thyroid antibodies, confirming Hashimoto's hypothyroidism. She is doing well on the low dose of levothyroxine, 50 g daily, with normal thyroid tests.  - She appears euthyroid, but continues to complain of fatigue  - we again discussed about Armour in the past, and I explained that since she is on such a low dose of levothyroxine, that is a sign that her own thyroid is providing most of the thyroid hormones that she needs, and I do not feel that Armour would be of great help at this point - we discussed about taking the thyroid hormone every day, with water, >30 minutes  before breakfast, separated by >4 hours from acid reflux medications, calcium, iron, multivitamins. Pt. is taking it correctly. - will check thyroid tests today: TSH and fT4 - If labs are abnormal, she will need to return for repeat TFTs in 1.5 months - OTW, I will see her back in a year   Office Visit on 02/10/2017  Component Date Value Ref Range Status  . Free T4 02/10/2017 0.74  0.60 - 1.60 ng/dL Final   Comment: Specimens from patients who are undergoing biotin therapy and /or ingesting biotin supplements may contain high levels of biotin.  The higher biotin concentration in these specimens interferes with this Free T4 assay.  Specimens that contain high levels  of biotin may cause false high results  for this Free T4 assay.  Please interpret results in light of the total clinical presentation of the patient.    Marland Kitchen TSH 02/10/2017 1.62  0.35 - 4.50 uIU/mL Final   Excellent TFTs.  Philemon Kingdom, MD PhD [facility] Endocrinology

## 2017-02-15 ENCOUNTER — Encounter: Payer: Self-pay | Admitting: Podiatry

## 2017-02-15 ENCOUNTER — Ambulatory Visit (INDEPENDENT_AMBULATORY_CARE_PROVIDER_SITE_OTHER): Payer: Managed Care, Other (non HMO) | Admitting: Podiatry

## 2017-02-15 DIAGNOSIS — L989 Disorder of the skin and subcutaneous tissue, unspecified: Secondary | ICD-10-CM

## 2017-02-15 DIAGNOSIS — B070 Plantar wart: Secondary | ICD-10-CM | POA: Diagnosis not present

## 2017-02-15 NOTE — Progress Notes (Signed)
This patient returns to the office follow-up application of acid to the wart on her left foot.  She says she had initial pain the first few days but then the pain has subsided.  She says she is walking now without any pain or discomfort and is unaware of the wart on the bottom of her left foot.  She presents the office today for an evaluation of the site of the acid application, left foot  GENERAL APPEARANCE: Alert, conversant. Appropriately groomed. No acute distress.  VASCULAR: Pedal pulses are  palpable at  [facility] and PT bilateral.  Capillary refill time is immediate to all digits,  Normal temperature gradient.  Digital hair growth is present bilateral  NEUROLOGIC: sensation is normal to 5.07 monofilament at 5/5 sites bilateral.  Light touch is intact bilateral, Muscle strength normal.  MUSCULOSKELETAL: acceptable muscle strength, tone and stability bilateral.  Intrinsic muscluature intact bilateral.  Rectus appearance of foot and digits noted bilateral.   DERMATOLOGIC: skin color, texture, and turgor are within normal limits.  No preulcerative lesions or ulcers  are seen, no interdigital maceration noted.    Digital nails are asymptomatic. No drainage noted. Examination of the plantar aspect of the fifth meta-base left foot reveals a hyperkeratotic roof has developed at the site of the wart.  No evidence of any blister or fluctuance noted at the site. Ulceration of wart site.  S/P wart left foot  Debride callus/hyperkeratotic lesion at site wart left foot.  ROV prn.  The site of the acid application appears to have caused the wart to disappear.  We discussed further application of the medicine but shows against it due to the fact that she has a social event which she does not want to have any foot problems until after the social event has past   Gardiner Barefoot DPM

## 2017-03-04 ENCOUNTER — Ambulatory Visit: Payer: Managed Care, Other (non HMO) | Admitting: Podiatry

## 2017-03-09 ENCOUNTER — Ambulatory Visit: Payer: Self-pay | Admitting: Physician Assistant

## 2017-03-28 ENCOUNTER — Other Ambulatory Visit: Payer: Self-pay | Admitting: Internal Medicine

## 2017-05-18 ENCOUNTER — Encounter: Payer: Self-pay | Admitting: Internal Medicine

## 2017-06-15 ENCOUNTER — Other Ambulatory Visit: Payer: Self-pay

## 2017-06-15 MED ORDER — SYNTHROID 50 MCG PO TABS: ORAL_TABLET | ORAL | 1 refills | Status: DC

## 2017-06-28 ENCOUNTER — Other Ambulatory Visit: Payer: Self-pay | Admitting: Internal Medicine

## 2017-07-13 ENCOUNTER — Encounter: Payer: Self-pay | Admitting: Physician Assistant

## 2017-09-14 ENCOUNTER — Encounter: Payer: Self-pay | Admitting: Physician Assistant

## 2017-09-26 ENCOUNTER — Other Ambulatory Visit: Payer: Self-pay

## 2017-09-26 MED ORDER — DULOXETINE HCL 30 MG PO CPEP: ORAL_CAPSULE | ORAL | 0 refills | Status: DC

## 2017-09-27 ENCOUNTER — Other Ambulatory Visit: Payer: Self-pay | Admitting: Physician Assistant

## 2017-09-27 MED ORDER — DULOXETINE HCL 30 MG PO CPEP: ORAL_CAPSULE | ORAL | 1 refills | Status: DC

## 2017-09-27 MED ORDER — DULOXETINE HCL 30 MG PO CPEP: ORAL_CAPSULE | ORAL | 0 refills | Status: DC

## 2017-09-27 NOTE — Progress Notes (Unsigned)
Future Appointments  Date Time Provider [place]  02/10/2018  1:00 PM Philemon Kingdom, MD [facility] None

## 2017-12-07 ENCOUNTER — Other Ambulatory Visit: Payer: Self-pay | Admitting: Internal Medicine

## 2017-12-16 ENCOUNTER — Other Ambulatory Visit: Payer: Self-pay | Admitting: Internal Medicine

## 2018-01-16 ENCOUNTER — Encounter (INDEPENDENT_AMBULATORY_CARE_PROVIDER_SITE_OTHER): Payer: Self-pay

## 2018-02-08 ENCOUNTER — Other Ambulatory Visit: Payer: Self-pay | Admitting: Physician Assistant

## 2018-02-10 ENCOUNTER — Ambulatory Visit (INDEPENDENT_AMBULATORY_CARE_PROVIDER_SITE_OTHER): Payer: Medicare Other | Admitting: Internal Medicine

## 2018-02-10 ENCOUNTER — Encounter: Payer: Self-pay | Admitting: Internal Medicine

## 2018-02-10 VITALS — BP 132/78 | HR 79 | Ht 59.65 in | Wt 175.2 lb

## 2018-02-10 DIAGNOSIS — E038 Other specified hypothyroidism: Secondary | ICD-10-CM | POA: Diagnosis not present

## 2018-02-10 DIAGNOSIS — E063 Autoimmune thyroiditis: Secondary | ICD-10-CM | POA: Diagnosis not present

## 2018-02-10 LAB — TSH: TSH: 1.32 u[IU]/mL (ref 0.35–4.50)

## 2018-02-10 LAB — T4, FREE: Free T4: 0.9 ng/dL (ref 0.60–1.60)

## 2018-02-10 MED ORDER — LEVOTHYROXINE SODIUM 50 MCG PO TABS: 50.0000 ug | ORAL_TABLET | Freq: Every day | ORAL | 3 refills | Status: DC

## 2018-02-10 NOTE — Patient Instructions (Addendum)
Please stop at the lab.  Please continue Synthroid/Levothyroxine 50 mcg daily.  Take the thyroid hormone every day, with water, at least 30 minutes before breakfast, separated by at least 4 hours from: - acid reflux medications - calcium - iron - multivitamins  Please return in 1 year.

## 2018-02-10 NOTE — Progress Notes (Signed)
Patient ID: Michelle Miranda, female   DOB: 1950-09-28, 67 y.o.   MRN: [id number]    HPI  Michelle Miranda is a 67 y.o.-year-old female, initially referred by her PCP, Vicie Mutters, NP (Dr. Melford Aase), returning for follow-up for Hashimoto's hypothyroidism. Last visit 1 year ago.  Reviewed and addended history: Pt. has been dx with hypothyroidism in 2012 and Hashimoto's thyroiditis in 04/2016 >> prev. On Levothyroxine, then on Synthroid d.a.w. 37.5 mcg 6/7 and 75 mcg 1/7 (changed in 01/2016).  Latest thyroid dose change was to 50 mcg daily and she has maintained normal TFTs afterwards.    Pt is on  Synthroid d.a.w. 50 mcg daily, taken: - in am - fasting - Drinks coffee and creamer 1 hours later - at least 30 min from b'fast - no Ca, Fe, PPIs - moved MVI >4h later - not on Biotin - not for last few weeks  Also continue selenium 200 mcg daily (increased at last visit). She is on vitamin D3 5000 units daily  Reviewed patient's TFTs: Lab Results  Component Value Date   TSH 1.62 02/10/2017   TSH 2.08 07/06/2016   TSH 2.02 05/18/2016   TSH 1.73 04/09/2016   TSH 5.15 (H) 02/23/2016   TSH 1.811 05/19/2015   TSH 2.250 02/11/2015   TSH 4.333 01/01/2015   TSH 3.618 06/10/2014   TSH 4.467 05/10/2014   FREET4 0.74 02/10/2017   FREET4 0.70 05/18/2016   FREET4 0.79 04/09/2016    Thyroid antibodies are elevated, giving her a diagnosis of Hashimoto's thyroiditis: Component     Latest Ref Rng & Units 04/09/2016  Thyroperoxidase Ab SerPl-aCnc     <9 IU/mL 9 (H)  Thyroglobulin Ab     <2 IU/mL <1   Pt denies: - feeling nodules in neck - hoarseness - dysphagia - choking - SOB with lying down  She has + FH of thyroid disorders in: mother - hypothyroid. No FH of thyroid cancer. No h/o radiation tx to head or neck.  No seaweed or kelp. No recent contrast studies. No herbal supplements. No Biotin use. No recent steroids use.   ROS: Constitutional: +  weight loss, + fatigue, + hot flushes, no  subjective hypothermia Eyes: no blurry vision, no xerophthalmia ENT: no sore throat, + see HPI Cardiovascular: no CP/no SOB/no palpitations/no leg swelling Respiratory: no cough/no SOB/no wheezing Gastrointestinal: no N/no V/no D/no C/no acid reflux Musculoskeletal: no muscle aches/no joint aches Skin: no rashes, + hair loss Neurological: + tremors/no numbness/no tingling/no dizziness  I reviewed pt's medications, allergies, PMH, social hx, family hx, and changes were documented in the history of present illness. Otherwise, unchanged from my initial visit note.  Past Medical History:  Diagnosis Date  . Arthritis   . Depression   . Hyperlipidemia   . Hypertension   . Hypothyroid   . Osteopenia   . Urinary tract infection 12-15-2011  . Vitamin D deficiency    Past Surgical History:  Procedure Laterality Date  . c sections  jan 1980 and march 1984  . ovarian cyst with lararoscopy march 1998    . TOTAL KNEE ARTHROPLASTY  01/03/2012   Procedure: TOTAL KNEE ARTHROPLASTY;  Surgeon: Gearlean Alf, MD;  Location: [facility];  Service: Orthopedics;  Laterality: Right;  . TOTAL KNEE ARTHROPLASTY Left 09/11/2012   Procedure: Left Total Knee Arthroplasty;  Surgeon: Gearlean Alf, MD;  Location: [facility];  Service: Orthopedics;  Laterality: Left;  . WISDOM TOOTH EXTRACTION  09-05-12   extractions  Social History   Social History  . Marital status: Married    Spouse name: N/A  . Number of children: 2   Occupational History  . Law office admin   Social History Main Topics  . Smoking status: Never Smoker  . Smokeless tobacco: Never Used  . Alcohol use Yes     Comment: 1-2 glass wine night  . Drug use: No   Current Outpatient Medications on File Prior to Visit  Medication Sig Dispense Refill  . DULoxetine (CYMBALTA) 30 MG capsule TAKE 1 CAPSULE BY MOUTH EVERY DAY 90 capsule 1  . raloxifene (EVISTA) 60 MG tablet TAKE 1 TABLET BY MOUTH EVERY DAY FOR OSTEOPOROSIS 30 tablet 0  . SYNTHROID  50 MCG tablet TAKE 1 TABLET BY MOUTH EVERY DAY BEFORE BREAKFAST 90 tablet 1   No current facility-administered medications on file prior to visit.    Allergies  Allergen Reactions  . Oxycodone     "Found Oxycodone not effective for pain control" with last RTKA  . Other     Opiods,causes severe anxiety   Family History  Problem Relation Age of Onset  . Hypertension Mother   . Thyroid disease Mother   . Arthritis Mother   . Cancer Mother   . COPD Father   . Breast cancer Maternal Grandmother   . Breast cancer Paternal Grandmother    PE: BP 132/78   Pulse 79   Ht 4' 11.65" (1.515 m)   Wt 175 lb 3.2 oz (79.5 kg)   SpO2 98%   BMI 34.62 kg/m  Wt Readings from Last 3 Encounters:  02/10/18 175 lb 3.2 oz (79.5 kg)  02/10/17 181 lb (82.1 kg)  08/09/16 184 lb (83.5 kg)   Constitutional: overweight, in NAD Eyes: PERRLA, EOMI, no exophthalmos ENT: moist mucous membranes, no thyromegaly, no cervical lymphadenopathy Cardiovascular: RRR, No MRG Respiratory: CTA B Gastrointestinal: abdomen soft, NT, ND, BS+ Musculoskeletal: no deformities, strength intact in all 4 Skin: moist, warm, no rashes Neurological: + Chronic tremor with outstretched hands, DTR normal in all 4  ASSESSMENT: 1. Hypothyroidism 2/2 Hashimoto's thyroiditis  PLAN:  1. Patient with long-standing Hashimoto's hypothyroidism, doing well on the low-dose of levothyroxine - latest thyroid labs reviewed with pt >> normal  - she continues on LT4 50 mcg daily.  Would like to switch to generic levothyroxine if possible due to price. - pt feels good on this dose but continues to complain of fatigue.  At last visit, she was wondering whether switching to Armour will help her, however, at this low dose of supplementation, I do not feel that Armour will be a great help  - we discussed about taking the thyroid hormone every day, with water, >30 minutes before breakfast, separated by >4 hours from acid reflux medications, calcium,  iron, multivitamins. Pt. is taking it correctly. - will check thyroid tests today: TSH and fT4 - If labs are abnormal, she will need to return for repeat TFTs in 1.5 months  - time spent with the patient: 15 minutes, of which >50% was spent in obtaining information about her symptoms, reviewing her previous labs, evaluations, and treatments, counseling her about her condition (please see the discussed topics above), and developing a plan to further investigate and treat it.  Needs refills - generic.  Component     Latest Ref Rng & Units 02/10/2018  TSH     0.35 - 4.50 uIU/mL 1.32  T4,Free(Direct)     0.60 - 1.60 ng/dL 0.90  Thyroid tests are excellent.  We will send a new prescription for levothyroxine (generic) to her pharmacy.  Philemon Kingdom, MD PhD [facility]

## 2018-02-20 ENCOUNTER — Telehealth: Payer: Self-pay | Admitting: Emergency Medicine

## 2018-02-20 ENCOUNTER — Other Ambulatory Visit: Payer: Self-pay

## 2018-02-20 MED ORDER — LEVOTHYROXINE SODIUM 50 MCG PO TABS: 50.0000 ug | ORAL_TABLET | Freq: Every day | ORAL | 3 refills | Status: DC

## 2018-02-20 NOTE — Telephone Encounter (Signed)
Sent, the original prescription was ordered as generic but put a note on this prescription that it is ok to be ordered as generic.

## 2018-02-20 NOTE — Telephone Encounter (Signed)
[facility] called and stated that she would like the generic of levothyroxine (SYNTHROID, LEVOTHROID) 50 MCG tablet. Can she get a prescription [facility]. Thanks.

## 2018-03-13 NOTE — Progress Notes (Signed)
Complete Physical  Assessment and Plan:   Encounter for general adult medical examination with abnormal findings Colonoscopy - referred back to GI, mammogram - she will schedule, Tdap administered, pneumococcal conjugate - 23 valent administered per new guidelines  Essential hypertension - continue medications, DASH diet, exercise and monitor at home. Call if greater than 130/80.  - CBC with Differential/Platelet - CMP/GFR - Urinalysis, Routine w reflex microscopic (not at [facility]) - Microalbumin / creatinine urine ratio  Other specified hypothyroidism Managed by [name], continue medications the same, reminded to take on an empty stomach 30-68mins before food.  Defer TSH as was just checked by [name]  Hyperlipidemia -continue medications, check lipids, decrease fatty foods, increase activity.  - Lipid panel  Obesity Obesity with co morbidities- long discussion about weight loss, diet, and exercise  Depression, major, in remission ([name]) remission   Prediabetes Discussed general issues about diabetes pathophysiology and management., Educational material distributed., Suggested low cholesterol diet., Encouraged aerobic exercise., Discussed foot care., Reminded to get yearly retinal exam. - Hemoglobin A1c   Vitamin D deficiency - Vit D  25 hydroxy (rtn osteoporosis monitoring)  Osteoarthritis of both knees, unspecified osteoarthritis type Weight loss advised, better after surgery   Osteopenia - DG Bone Density; Future  Medication management - Magnesium  Screening cervical cancer - PAP with HPV  Discussed med's effects and SE's. Screening labs and tests as requested with regular follow-up as recommended.  Future Appointments  Date Time Provider [place]  09/19/2018  2:30 PM Unk Pinto, MD [facility] None  02/15/2019  2:00 PM Philemon Kingdom, MD [facility] None  03/15/2019  3:00 PM Liane Comber, NP [facility] None     HPI 67 y.o. female   presents for a complete physical. She has OA (osteoarthritis) of knee; Hyperlipidemia; Hypertension; Hypothyroidism due to Hashimoto's thyroiditis; Depression, major, in remission ([name]); Vitamin D deficiency; Obesity (BMI 30.0-34.9); Prediabetes; and Osteopenia on their problem list.   She reports she has had a busy year due to husband having a stroke and first grandchild (girl) arrived. She reports he mood is doing very well on cymbalta.   She is on evista for osteoporosis; + vaginal dryness but albolene helps. Last PAP 2014, never abnormal.   BMI is Body mass index is 34.78 kg/m., she has been working on diet and exercise, goes to pilates twice weekly for 1 hour.  Wt Readings from Last 3 Encounters:  03/14/18 176 lb (79.8 kg)  02/10/18 175 lb 3.2 oz (79.5 kg)  02/10/17 181 lb (82.1 kg)    Her blood pressure has been controlled at home, today their BP is BP: 108/72 She does workout. She denies chest pain, shortness of breath, dizziness.   She is not on cholesterol medication and denies myalgias. Her cholesterol is at goal. The cholesterol last visit was:   Lab Results  Component Value Date   CHOL 195 07/06/2016   HDL 92 07/06/2016   LDLCALC 85 07/06/2016   TRIG 92 07/06/2016   CHOLHDL 2.1 07/06/2016   She has not been working on diet and exercise for prediabetes, and denies polydipsia, polyuria and visual disturbances. Last A1C in the office was:  Lab Results  Component Value Date   HGBA1C 5.6 07/06/2016   Patient is on Vitamin D supplement, she is on 5000 IU daily.  Lab Results  Component Value Date   VD25OH 30 07/06/2016   She is on thyroid medication. Her medication was not changed last visit, she is on 2mcg daily, managed  by [name]. Patient denies nervousness and palpitations. Her last TSH was  Lab Results  Component Value Date   TSH 1.32 02/10/2018      Current Medications:  Current Outpatient Medications on File Prior to Visit  Medication Sig Dispense Refill   . Cholecalciferol (VITAMIN D3) 5000 units CAPS Take by mouth.    . DULoxetine (CYMBALTA) 30 MG capsule TAKE 1 CAPSULE BY MOUTH EVERY DAY 90 capsule 1  . levothyroxine (SYNTHROID, LEVOTHROID) 50 MCG tablet Take 1 tablet (50 mcg total) by mouth daily. 90 tablet 3  . Multiple Vitamin (MULTIVITAMIN) tablet Take 1 tablet by mouth daily.    . raloxifene (EVISTA) 60 MG tablet TAKE 1 TABLET BY MOUTH EVERY DAY FOR OSTEOPOROSIS 30 tablet 0   No current facility-administered medications on file prior to visit.    Health Maintenance:   Immunization History  Administered Date(s) Administered  . Influenza Split 05/10/2014, 05/19/2015  . Pneumococcal Polysaccharide-23 03/14/2018  . Td 06/02/2006  . Tdap 03/14/2018   Tetanus: 2007,  Pneumovax: DUE  Prevnar 13: low risk, doesn't need per new guidelines  [facility]: declines  Pap: 2014, never abnormal, DUE MGM: 09/2016  DEXA: 07/2015 due 2018, on evista Colonoscopy: 2007 due 2017  EGD: N/A CTA 09/2012  Vision: Dr. Bing Plume, last visit 2018, has appointment this fall  Dental: Dr. Marquette Saa, last visit 12/2017, goes q38m  [name], [name] [name], MD as Consulting Physician (Orthopedic Surgery) [name], MD as Consulting Physician (Ophthalmology) [name], MD as Consulting Physician (Gastroenterology)   Medical History:  Past Medical History:  Diagnosis Date  . Arthritis   . Depression   . Hyperlipidemia   . Hypertension   . Hypothyroid   . Osteopenia   . Urinary tract infection 12-15-2011  . Vitamin D deficiency    Allergies Allergies  Allergen Reactions  . Oxycodone     "Found Oxycodone not effective for pain control" with last RTKA  . Other     Opiods,causes severe anxiety    SURGICAL HISTORY She  has a past surgical history that includes c sections (jan 1980 and march 1984); ovarian cyst with lararoscopy march 1998 (1998);  Total knee arthroplasty (01/03/2012); Wisdom tooth extraction (09-05-12); Total knee arthroplasty (Left, 09/11/2012); and Cataract extraction, bilateral (Bilateral, 2016). FAMILY HISTORY Her family history includes Arthritis in her mother; Breast cancer in her maternal grandmother and paternal grandmother; COPD in her father; Cancer in her mother; Hypertension in her mother; Stroke (age of onset: 37) in her maternal grandmother; Thyroid disease in her mother. SOCIAL HISTORY She  reports that she has never smoked. She has never used smokeless tobacco. She reports that she drinks alcohol. She reports that she does not use drugs.  Review of Systems  Constitutional: Negative for chills, diaphoresis, fever, malaise/fatigue and weight loss.  HENT: Negative for hearing loss and tinnitus.   Eyes: Negative.  Negative for blurred vision and double vision.  Respiratory: Negative for cough, hemoptysis, sputum production, shortness of breath, wheezing and stridor.   Cardiovascular: Negative.  Negative for chest pain, palpitations, orthopnea, claudication, leg swelling and PND.  Gastrointestinal: Negative.  Negative for abdominal pain, blood in stool, constipation, diarrhea, heartburn, melena, nausea and vomiting.  Genitourinary: Negative.   Musculoskeletal: Negative.  Negative for falls, joint pain and myalgias.  Skin: Negative for itching and rash.  Neurological: Negative.  Negative for dizziness, tingling, sensory change, weakness and headaches.  Endo/Heme/Allergies: Negative.  Negative for polydipsia.  Psychiatric/Behavioral: Negative.  Negative for depression, memory loss, substance abuse and suicidal ideas. The patient is not nervous/anxious and does not have insomnia.   All other systems reviewed and are negative.   Physical Exam: Estimated body mass index is 34.78 kg/m as calculated from the following:   Height as of this encounter: 4' 11.65" (1.515 m).   Weight as of this encounter: 176 lb (79.8  kg). BP 108/72   Pulse 89   Temp (!) 97.3 F (36.3 C)   Ht 4' 11.65" (1.515 m)   Wt 176 lb (79.8 kg)   SpO2 97%   BMI 34.78 kg/m  General Appearance: Well nourished, in no apparent distress. Eyes: PERRLA, EOMs, conjunctiva no swelling or erythema, normal fundi and vessels. Sinuses: No Frontal/maxillary tenderness ENT/Mouth: Ext aud canals clear, normal light reflex with TMs without erythema, bulging.  Good dentition. No erythema, swelling, or exudate on post pharynx. Tonsils not swollen or erythematous. Hearing normal.  Neck: Supple, thyroid normal. No bruits Respiratory: Respiratory effort normal, BS equal bilaterally without rales, rhonchi, wheezing or stridor. Cardio: RRR without murmurs, rubs or gallops. Brisk peripheral pulses without edema.  Chest: symmetric, with normal excursions and percussion. Breasts: Symmetric, without masses, without nipple discharge, retractions. Abdomen: Soft, +BS. Non tender, no guarding, rebound, hernias, masses, or organomegaly. .  Lymphatics: Non tender without lymphadenopathy.  Genitourinary: normal external structures, vaginal canal with flattened rugae, multiparous cervix without notable discharge or lesions, adnexa nonpalpable, no palpable pelvic mass Musculoskeletal: Full ROM all peripheral extremities,5/5 strength, and normal gait. Skin: Warm, dry without rashes, lesions, ecchymosis.  Neuro: Cranial nerves intact, reflexes equal bilaterally. Normal muscle tone, no cerebellar symptoms. Sensation intact.  Psych: Awake and oriented X 3, normal affect, Insight and Judgment appropriate.   EKG:  WNL AORTA SCAN: defer   Michelle Miranda 5:31 PM

## 2018-03-14 ENCOUNTER — Ambulatory Visit (INDEPENDENT_AMBULATORY_CARE_PROVIDER_SITE_OTHER): Payer: Medicare Other | Admitting: Adult Health

## 2018-03-14 ENCOUNTER — Encounter: Payer: Self-pay | Admitting: Adult Health

## 2018-03-14 ENCOUNTER — Other Ambulatory Visit (HOSPITAL_COMMUNITY)
Admission: RE | Admit: 2018-03-14 | Discharge: 2018-03-14 | Disposition: A | Discharge status: Home / Self Care | Payer: Medicare Other | Source: Ambulatory Visit | Attending: Adult Health | Admitting: Adult Health

## 2018-03-14 VITALS — BP 108/72 | HR 89 | Temp 97.3°F | Ht 59.65 in | Wt 176.0 lb

## 2018-03-14 DIAGNOSIS — Z1211 Encounter for screening for malignant neoplasm of colon: Secondary | ICD-10-CM

## 2018-03-14 DIAGNOSIS — Z23 Encounter for immunization: Secondary | ICD-10-CM | POA: Diagnosis not present

## 2018-03-14 DIAGNOSIS — Z0001 Encounter for general adult medical examination with abnormal findings: Secondary | ICD-10-CM

## 2018-03-14 DIAGNOSIS — Z124 Encounter for screening for malignant neoplasm of cervix: Secondary | ICD-10-CM

## 2018-03-14 DIAGNOSIS — R7303 Prediabetes: Secondary | ICD-10-CM | POA: Diagnosis not present

## 2018-03-14 DIAGNOSIS — M8580 Other specified disorders of bone density and structure, unspecified site: Secondary | ICD-10-CM

## 2018-03-14 DIAGNOSIS — E785 Hyperlipidemia, unspecified: Secondary | ICD-10-CM | POA: Diagnosis not present

## 2018-03-14 DIAGNOSIS — I10 Essential (primary) hypertension: Secondary | ICD-10-CM

## 2018-03-14 DIAGNOSIS — E559 Vitamin D deficiency, unspecified: Secondary | ICD-10-CM | POA: Diagnosis not present

## 2018-03-14 DIAGNOSIS — E669 Obesity, unspecified: Secondary | ICD-10-CM

## 2018-03-14 DIAGNOSIS — E063 Autoimmune thyroiditis: Secondary | ICD-10-CM

## 2018-03-14 DIAGNOSIS — M170 Bilateral primary osteoarthritis of knee: Secondary | ICD-10-CM

## 2018-03-14 DIAGNOSIS — Z79899 Other long term (current) drug therapy: Secondary | ICD-10-CM

## 2018-03-14 DIAGNOSIS — E038 Other specified hypothyroidism: Secondary | ICD-10-CM

## 2018-03-14 DIAGNOSIS — F325 Major depressive disorder, single episode, in full remission: Secondary | ICD-10-CM

## 2018-03-14 DIAGNOSIS — E2839 Other primary ovarian failure: Secondary | ICD-10-CM

## 2018-03-14 NOTE — Patient Instructions (Signed)
[facility]  7 a.m.-6:30 p.m., Monday 7 a.m.-5 p.m., Tuesday-Friday Schedule an appointment by calling [phone].  [facility] Schedule an appointment by calling [phone].    Know what a healthy weight is for you (roughly BMI <25) and aim to maintain this  Aim for 7+ servings of fruits and vegetables daily  65-80+ fluid ounces of water or unsweet tea for healthy kidneys  Limit to max 1 drink of alcohol per day; avoid smoking/tobacco  Limit animal fats in diet for cholesterol and heart health - choose grass fed whenever available  Avoid highly processed foods, and foods high in saturated/trans fats  Aim for low stress - take time to unwind and care for your mental health  Aim for 150 min of moderate intensity exercise weekly for heart health, and weights twice weekly for bone health  Aim for 7-9 hours of sleep daily      When it comes to diets, agreement about the perfect plan isn't easy to find, even among the experts. Experts at the [employer] developed an idea known as the Healthy Eating Plate. Just imagine a plate divided into logical, healthy portions.  The emphasis is on diet quality:  Load up on vegetables and fruits - one-half of your plate: Aim for color and variety, and remember that potatoes don't count.  Go for whole grains - one-quarter of your plate: Whole wheat, barley, wheat berries, quinoa, oats, brown rice, and foods made with them. If you want pasta, go with whole wheat pasta.  Protein power - one-quarter of your plate: Fish, chicken, beans, and nuts are all healthy, versatile protein sources. Limit red meat.  The diet, however, does go beyond the plate, offering a few other suggestions.  Use healthy plant oils, such as olive, canola, soy, corn, sunflower and peanut. Check the labels, and avoid partially hydrogenated oil, which have unhealthy trans fats.  If you're thirsty, drink water.  Coffee and tea are good in moderation, but skip sugary drinks and limit milk and dairy products to one or two daily servings.  The type of carbohydrate in the diet is more important than the amount. Some sources of carbohydrates, such as vegetables, fruits, whole grains, and beans-are healthier than others.  Finally, stay active.

## 2018-03-15 LAB — COMPLETE METABOLIC PANEL WITH GFR
AG RATIO: 1.6 (calc) (ref 1.0–2.5)
ALT: 23 U/L (ref 6–29)
AST: 24 U/L (ref 10–35)
Albumin: 4.3 g/dL (ref 3.6–5.1)
Alkaline phosphatase (APISO): 87 U/L (ref 33–130)
BILIRUBIN TOTAL: 0.5 mg/dL (ref 0.2–1.2)
BUN: 11 mg/dL (ref 7–25)
CHLORIDE: 104 mmol/L (ref 98–110)
CO2: 27 mmol/L (ref 20–32)
Calcium: 9.4 mg/dL (ref 8.6–10.4)
Creat: 0.81 mg/dL (ref 0.50–0.99)
GFR, Est African American: 88 mL/min/{1.73_m2} (ref >=60)
GFR, Est Non African American: 76 mL/min/{1.73_m2} (ref >=60)
Globulin: 2.7 g/dL (calc) (ref 1.9–3.7)
Glucose, Bld: 100 mg/dL — ABNORMAL HIGH (ref 65–99)
POTASSIUM: 4.8 mmol/L (ref 3.5–5.3)
Sodium: 141 mmol/L (ref 135–146)
Total Protein: 7 g/dL (ref 6.1–8.1)

## 2018-03-15 LAB — CBC WITH DIFFERENTIAL/PLATELET
Basophils Absolute: 62 cells/uL (ref 0–200)
Basophils Relative: 0.7 %
EOS ABS: 211 {cells}/uL (ref 15–500)
Eosinophils Relative: 2.4 %
HCT: 40.1 % (ref 35.0–45.0)
Hemoglobin: 13.7 g/dL (ref 11.7–15.5)
Lymphs Abs: 2816 cells/uL (ref 850–3900)
MCH: 31.8 pg (ref 27.0–33.0)
MCHC: 34.2 g/dL (ref 32.0–36.0)
MCV: 93 fL (ref 80.0–100.0)
MPV: 9.9 fL (ref 7.5–12.5)
Monocytes Relative: 9.3 %
NEUTROS PCT: 55.6 %
Neutro Abs: 4893 cells/uL (ref 1500–7800)
PLATELETS: 339 10*3/uL (ref 140–400)
RBC: 4.31 10*6/uL (ref 3.80–5.10)
RDW: 12.3 % (ref 11.0–15.0)
TOTAL LYMPHOCYTE: 32 %
WBC: 8.8 10*3/uL (ref 3.8–10.8)
WBCMIX: 818 {cells}/uL (ref 200–950)

## 2018-03-15 LAB — MAGNESIUM: Magnesium: 2.3 mg/dL (ref 1.5–2.5)

## 2018-03-15 LAB — HEMOGLOBIN A1C
HEMOGLOBIN A1C: 5.5 %{Hb} (ref <5.7)
Mean Plasma Glucose: 111 (calc)
eAG (mmol/L): 6.2 (calc)

## 2018-03-15 LAB — LIPID PANEL
CHOL/HDL RATIO: 2.3 (calc) (ref <5.0)
Cholesterol: 206 mg/dL — ABNORMAL HIGH (ref <200)
HDL: 89 mg/dL (ref >50)
LDL CHOLESTEROL (CALC): 100 mg/dL — AB
Non-HDL Cholesterol (Calc): 117 mg/dL (calc) (ref <130)
TRIGLYCERIDES: 77 mg/dL (ref <150)

## 2018-03-15 LAB — URINALYSIS W MICROSCOPIC + REFLEX CULTURE
BILIRUBIN URINE: NEGATIVE
Bacteria, UA: NONE SEEN /HPF
Glucose, UA: NEGATIVE
HGB URINE DIPSTICK: NEGATIVE
HYALINE CAST: NONE SEEN /LPF
Leukocyte Esterase: NEGATIVE
NITRITES URINE, INITIAL: NEGATIVE
Protein, ur: NEGATIVE
RBC / HPF: NONE SEEN /HPF (ref 0–2)
Specific Gravity, Urine: 1.016 (ref 1.001–1.03)
WBC UA: NONE SEEN /HPF (ref 0–5)
pH: 6 (ref 5.0–8.0)

## 2018-03-15 LAB — MICROALBUMIN / CREATININE URINE RATIO
CREATININE, URINE: 99 mg/dL (ref 20–275)
Microalb Creat Ratio: 2 mcg/mg creat (ref <30)
Microalb, Ur: 0.2 mg/dL

## 2018-03-15 LAB — NO CULTURE INDICATED

## 2018-03-15 LAB — VITAMIN D 25 HYDROXY (VIT D DEFICIENCY, FRACTURES): Vit D, 25-Hydroxy: 71 ng/mL (ref 30–100)

## 2018-03-16 LAB — CYTOLOGY - PAP
Adequacy: ABSENT
DIAGNOSIS: NEGATIVE
HPV: NOT DETECTED

## 2018-03-19 ENCOUNTER — Other Ambulatory Visit: Payer: Self-pay | Admitting: Internal Medicine

## 2018-04-23 ENCOUNTER — Other Ambulatory Visit: Payer: Self-pay | Admitting: Physician Assistant

## 2018-05-15 DIAGNOSIS — H35373 Puckering of macula, bilateral: Secondary | ICD-10-CM | POA: Diagnosis not present

## 2018-05-15 DIAGNOSIS — Z961 Presence of intraocular lens: Secondary | ICD-10-CM | POA: Diagnosis not present

## 2018-05-15 DIAGNOSIS — H43812 Vitreous degeneration, left eye: Secondary | ICD-10-CM | POA: Diagnosis not present

## 2018-05-15 DIAGNOSIS — H524 Presbyopia: Secondary | ICD-10-CM | POA: Diagnosis not present

## 2018-05-17 ENCOUNTER — Other Ambulatory Visit: Payer: Self-pay | Admitting: Physician Assistant

## 2018-05-17 DIAGNOSIS — Z1231 Encounter for screening mammogram for malignant neoplasm of breast: Secondary | ICD-10-CM

## 2018-07-19 DIAGNOSIS — Z0000 Encounter for general adult medical examination without abnormal findings: Secondary | ICD-10-CM

## 2018-07-21 ENCOUNTER — Ambulatory Visit
Admission: RE | Admit: 2018-07-21 | Discharge: 2018-07-21 | Disposition: A | Discharge status: Home / Self Care | Payer: Medicare Other | Source: Ambulatory Visit | Attending: Adult Health | Admitting: Adult Health

## 2018-07-21 ENCOUNTER — Ambulatory Visit
Admission: RE | Admit: 2018-07-21 | Discharge: 2018-07-21 | Disposition: A | Discharge status: Home / Self Care | Payer: Medicare Other | Source: Ambulatory Visit | Attending: Physician Assistant | Admitting: Physician Assistant

## 2018-07-21 DIAGNOSIS — Z1231 Encounter for screening mammogram for malignant neoplasm of breast: Secondary | ICD-10-CM

## 2018-07-21 DIAGNOSIS — E2839 Other primary ovarian failure: Secondary | ICD-10-CM

## 2018-07-21 DIAGNOSIS — M8589 Other specified disorders of bone density and structure, multiple sites: Secondary | ICD-10-CM | POA: Diagnosis not present

## 2018-07-21 DIAGNOSIS — Z780 Asymptomatic menopausal state: Secondary | ICD-10-CM | POA: Diagnosis not present

## 2018-07-21 DIAGNOSIS — M8580 Other specified disorders of bone density and structure, unspecified site: Secondary | ICD-10-CM

## 2018-08-14 DIAGNOSIS — Z23 Encounter for immunization: Secondary | ICD-10-CM | POA: Diagnosis not present

## 2018-09-04 ENCOUNTER — Telehealth: Payer: Self-pay | Admitting: Internal Medicine

## 2018-09-04 NOTE — Telephone Encounter (Signed)
Received previous GI records from [facility].  Pt requested Dr. Henrene Pastor to review.

## 2018-09-06 NOTE — Telephone Encounter (Signed)
Colonoscopy report from Dr. [name] dated June 16, 2006 has been reviewed.  Patient was described as undergoing initial colonoscopy for cancer screening.  No family history.  No symptoms.  Examination was normal and complete with excellent preparation.  Based on this information the patient would currently be due for repeat routine screening colonoscopy as it has been 10 years.  Okay to schedule screening colonoscopy with me in the [place].  Thank you

## 2018-09-18 NOTE — Progress Notes (Deleted)
MEDICARE ANNUAL WELLNESS VISIT AND FOLLOW UP  Assessment:    Over 40 minutes of exam, counseling, chart review and critical decision making was performed Future Appointments  Date Time Provider [place]  09/19/2018  2:00 PM Liane Comber, NP [facility] None  02/15/2019  2:00 PM Philemon Kingdom, MD [facility] None  03/26/2019  2:00 PM Unk Pinto, MD [facility] None     Plan:   During the course of the visit the patient was educated and counseled about appropriate screening and preventive services including:    Pneumococcal vaccine   Prevnar 13  Influenza vaccine  Td vaccine  Screening electrocardiogram  Bone densitometry screening  Colorectal cancer screening  Diabetes screening  Glaucoma screening  Nutrition counseling   Advanced directives: requested   Subjective:  Michelle Miranda is a 68 y.o. female who presents for Medicare Annual Wellness Visit and 3 month follow up.   She has had elevated blood pressure for *** years. Her blood pressure {HAS HAS NOT:18834} been controlled at home, today their BP is   She {DOES_DOES NID:78242} workout. She denies chest pain, shortness of breath, dizziness.  She {ACTION; IS/IS PNT:61443154} on cholesterol medication and denies myalgias. Her cholesterol {ACTION; IS/IS NOT:21021397} at goal. The cholesterol last visit was:   Lab Results  Component Value Date   CHOL 206 (H) 03/14/2018   HDL 89 03/14/2018   LDLCALC 100 (H) 03/14/2018   TRIG 77 03/14/2018   CHOLHDL 2.3 03/14/2018   She has had diabetes for *** years. She {Has/has not:18111} been working on diet and exercise for ***prediabetes, and denies {Symptoms; diabetes w/o none:19199}. Last A1C in the office was:  Lab Results  Component Value Date   HGBA1C 5.5 03/14/2018   Last GFR:   Lab Results  Component Value Date   GFRNONAA 76 03/14/2018   Lab Results  Component Value Date   GFRAA 88 03/14/2018   Patient is on Vitamin D supplement.   Lab  Results  Component Value Date   VD25OH 71 03/14/2018      Medication Review: Current Outpatient Medications on File Prior to Visit  Medication Sig Dispense Refill  . Cholecalciferol (VITAMIN D3) 5000 units CAPS Take by mouth.    . DULoxetine (CYMBALTA) 30 MG capsule Take 1 capsule Daily 90 capsule 1  . levothyroxine (SYNTHROID, LEVOTHROID) 50 MCG tablet Take 1 tablet (50 mcg total) by mouth daily. 90 tablet 3  . Multiple Vitamin (MULTIVITAMIN) tablet Take 1 tablet by mouth daily.    . raloxifene (EVISTA) 60 MG tablet TAKE 1 TABLET BY MOUTH EVERY DAY FOR OSTEOPOROSIS 90 tablet 3   No current facility-administered medications on file prior to visit.     Allergies  Allergen Reactions  . Oxycodone     "Found Oxycodone not effective for pain control" with last RTKA  . Other     Opiods,causes severe anxiety    Current Problems (verified) Patient Active Problem List   Diagnosis Date Noted  . Prediabetes 05/19/2015  . Osteopenia 05/19/2015  . Obesity (BMI 30.0-34.9) 05/10/2014  . Hyperlipidemia   . Hypertension   . Hypothyroidism due to Hashimoto's thyroiditis   . Depression, major, in remission ([place])   . Vitamin D deficiency   . OA (osteoarthritis) of knee 01/03/2012    Screening Tests Immunization History  Administered Date(s) Administered  . Influenza Split 05/10/2014, 05/19/2015  . Pneumococcal Polysaccharide-23 03/14/2018  . Td 06/02/2006  . Tdap 03/14/2018   Tetanus: 2007,  Pneumovax: 03/2018  Prevnar 13: due  03/2019 [facility] vaccine: [facility] Zostavax: declines  Pap: 03/2018, never abnormal, HPV neg, DONE MGM: 07/2018 DEXA: 07/2018, T-2.4 L fem, on evista Colonoscopy: 2007 due 2017 ***? Scheduled with Dr. Henrene Pastor  EGD: N/A CTA 09/2012  Names of Other Physician/Practitioners you currently use: 1. [facility] here for primary care 2. Vision: Dr. Bing Plume, last visit 2018, has appointment this fall  3. Dental: Dr. Marquette Saa, last  visit 12/2017, goes q23m  [name], [name] [name], MD as Consulting Physician (Orthopedic Surgery) [name], MD as Consulting Physician (Ophthalmology) [name], MD as Consulting Physician (Gastroenterology)  SURGICAL HISTORY She  has a past surgical history that includes c sections (jan 1980 and march 1984); ovarian cyst with lararoscopy march 1998 (1998); Total knee arthroplasty (01/03/2012); Wisdom tooth extraction (09-05-12); Total knee arthroplasty (Left, 09/11/2012); and Cataract extraction, bilateral (Bilateral, 2016). FAMILY HISTORY Her family history includes Arthritis in her mother; Breast cancer in her maternal grandmother and paternal grandmother; COPD in her father; Cancer in her mother; Hypertension in her mother; Stroke (age of onset: 66) in her maternal grandmother; Thyroid disease in her mother. SOCIAL HISTORY She  reports that she has never smoked. She has never used smokeless tobacco. She reports current alcohol use. She reports that she does not use drugs.   MEDICARE WELLNESS OBJECTIVES: Physical activity:   Cardiac risk factors:   Depression/mood screen:   Depression screen [facility] 2/9 03/14/2018  Decreased Interest 0  Down, Depressed, Hopeless 0  PHQ - 2 Score 0    ADLs:  No flowsheet data found.   Cognitive Testing  Alert? Yes  Normal Appearance?Yes  Oriented to person? Yes  Place? Yes   Time? Yes  Recall of three objects?  Yes  Can perform simple calculations? Yes  Displays appropriate judgment?Yes  Can read the correct time from a watch face?Yes  EOL planning:    Review of Systems  Constitutional: Negative for malaise/fatigue and weight loss.  HENT: Negative for hearing loss and tinnitus.   Eyes: Negative for blurred vision and double vision.  Respiratory: Negative for cough, sputum production, shortness of breath and wheezing.   Cardiovascular: Negative for chest pain,  palpitations, orthopnea, claudication, leg swelling and PND.  Gastrointestinal: Negative for abdominal pain, blood in stool, constipation, diarrhea, heartburn, melena, nausea and vomiting.  Genitourinary: Negative.   Musculoskeletal: Negative for falls, joint pain and myalgias.  Skin: Negative for rash.  Neurological: Negative for dizziness, tingling, sensory change, weakness and headaches.  Endo/Heme/Allergies: Negative for polydipsia.  Psychiatric/Behavioral: Negative.  Negative for depression, memory loss, substance abuse and suicidal ideas. The patient is not nervous/anxious and does not have insomnia.   All other systems reviewed and are negative.    Objective:     There were no vitals filed for this visit. There is no height or weight on file to calculate BMI.  General appearance: alert, no distress, WD/WN, female HEENT: normocephalic, sclerae anicteric, TMs pearly, nares patent, no discharge or erythema, pharynx normal Oral cavity: MMM, no lesions Neck: supple, no lymphadenopathy, no thyromegaly, no masses Heart: RRR, normal S1, S2, no murmurs Lungs: CTA bilaterally, no wheezes, rhonchi, or rales Abdomen: +bs, soft, non tender, non distended, no masses, no hepatomegaly, no splenomegaly Musculoskeletal: nontender, no swelling, no obvious deformity Extremities: no edema, no cyanosis, no clubbing Pulses: 2+ symmetric, upper and lower extremities, normal cap refill Neurological: alert, oriented x 3, CN2-12 intact, strength normal upper  extremities and lower extremities, sensation normal throughout, DTRs 2+ throughout, no cerebellar signs, gait normal Psychiatric: normal affect, behavior normal, pleasant   Medicare Attestation I have personally reviewed: The patient's medical and social history Their use of alcohol, tobacco or illicit drugs Their current medications and supplements The patient's functional ability including ADLs,fall risks, home safety risks, cognitive, and  hearing and visual impairment Diet and physical activities Evidence for depression or mood disorders  The patient's weight, height, BMI, and visual acuity have been recorded in the chart.  I have made referrals, counseling, and provided education to the patient based on review of the above and I have provided the patient with a written personalized care plan for preventive services.     Izora Ribas, NP   09/18/2018

## 2018-09-19 ENCOUNTER — Ambulatory Visit: Payer: Self-pay | Admitting: Internal Medicine

## 2018-09-19 ENCOUNTER — Ambulatory Visit: Payer: Self-pay | Admitting: Adult Health

## 2018-10-03 NOTE — Progress Notes (Signed)
MEDICARE ANNUAL WELLNESS VISIT AND FOLLOW UP  Assessment:     Encounter for annual medicare wellness  Essential hypertension At goal off of medications DASH diet, exercise and monitor at home. Call if greater than 130/80.  - CBC with Differential/Platelet - CMP/GFR - EKG - AAA [place]  Other specified hypothyroidism Managed by [name], continue medications the same, reminded to take on an empty stomach 30-23mins before food.  Defer TSH as was just checked by [name]  Hyperlipidemia -continue medications, check lipids, decrease fatty foods, increase activity.  - Lipid panel  Obesity Long discussion about weight loss, diet, and exercise Recommended diet heavy in fruits and veggies and low in animal meats, cheeses, and dairy products, appropriate calorie intake Discussed appropriate weight for height  Follow up at next visit  Depression, major, in remission ([place]) Recent flare, will check labs, increase cymbalta to 30 mg BID  Lifestyle discussed: diet/exerise, sleep hygiene, stress management, hydration   Prediabetes Discussed general issues about diabetes pathophysiology and management., Educational material distributed., Suggested low cholesterol diet., Encouraged aerobic exercise., Discussed foot care., Reminded to get yearly retinal exam. - Hemoglobin A1c   Vitamin D deficiency At goal at recent check; continue to recommend supplementation for goal of 70-100 Defer vitamin D level  Osteoarthritis of both knees, unspecified osteoarthritis type Weight loss advised, better after surgery   Osteopenia Continue with evista;  Repeat DEXA 2021 continue Vit D and Ca, weight bearing exercises  Medication management CBC, CMP/GFR  Fatigue Check CBC, CMP, TSH, discussed other labs but declines at this time Will try increasing depression medication due to PHQ-10 She will follow up if persisting  Over 40 minutes of exam, counseling, chart review and critical decision  making was performed Future Appointments  Date Time Provider [place]  10/23/2018  3:30 PM [facility] PREVISIT [id number] [facility]  11/06/2018 11:00 AM Irene Shipper, MD [facility]  02/15/2019  2:00 PM Philemon Kingdom, MD [facility] None  03/26/2019  2:00 PM Unk Pinto, MD [facility] None     Plan:   During the course of the visit the patient was educated and counseled about appropriate screening and preventive services including:    Pneumococcal vaccine   Prevnar 13  Influenza vaccine  Td vaccine  Screening electrocardiogram  Bone densitometry screening  Colorectal cancer screening  Diabetes screening  Glaucoma screening  Nutrition counseling   Advanced directives: requested   Subjective:  Michelle Miranda is a 68 y.o. female who presents for Medicare Annual Wellness Visit and 3 month follow up.   She reports she has been having increased hair loss, persistent fatigue despite resting well at night, doesn't ever feel fresh or rested, feels nearly at tears all the time, feels lack of motivation, wonders if this may be thyroid or depression.   She reports she has had a busy year due to husband having a stroke and first grandchild (girl) arrived. She reports he mood is doing very well on cymbalta.   She is on evista  for osteoporosis; + vaginal dryness but albolene helps.   BMI is Body mass index is 36.48 kg/m., she has been working on diet and exercise. Wt Readings from Last 3 Encounters:  10/04/18 180 lb 9.6 oz (81.9 kg)  03/14/18 176 lb (79.8 kg)  02/10/18 175 lb 3.2 oz (79.5 kg)    Her blood pressure has been controlled at home, today their BP is BP: 124/78 She does workout. She denies chest pain, shortness of breath, dizziness.  She is not on cholesterol medication and denies myalgias. Her cholesterol is not at goal. The cholesterol last visit was:   Lab Results  Component Value Date   CHOL 206 (H) 03/14/2018   HDL 89 03/14/2018    LDLCALC 100 (H) 03/14/2018   TRIG 77 03/14/2018   CHOLHDL 2.3 03/14/2018    She has not been working on diet and exercise for glucose management, and denies increased appetite, nausea, paresthesia of the feet, polydipsia, polyuria and visual disturbances. Last A1C in the office was:  Lab Results  Component Value Date   HGBA1C 5.5 03/14/2018   She is on thyroid medication. Her medication was not changed last visit.   Lab Results  Component Value Date   TSH 1.32 02/10/2018   Last GFR: Lab Results  Component Value Date   GFRNONAA 76 03/14/2018   Patient is on Vitamin D supplement.   Lab Results  Component Value Date   VD25OH 71 03/14/2018      Medication Review: Current Outpatient Medications on File Prior to Visit  Medication Sig Dispense Refill  . Cholecalciferol (VITAMIN D3) 5000 units CAPS Take by mouth.    . DULoxetine (CYMBALTA) 30 MG capsule Take 1 capsule Daily 90 capsule 1  . levothyroxine (SYNTHROID, LEVOTHROID) 50 MCG tablet Take 1 tablet (50 mcg total) by mouth daily. 90 tablet 3  . Methylcobalamin (B-12 FAST DISSOLVE SL) Place under the tongue daily.    . Multiple Vitamin (MULTIVITAMIN) tablet Take 1 tablet by mouth daily.    . raloxifene (EVISTA) 60 MG tablet TAKE 1 TABLET BY MOUTH EVERY DAY FOR OSTEOPOROSIS 90 tablet 3   No current facility-administered medications on file prior to visit.     Allergies  Allergen Reactions  . Oxycodone     "Found Oxycodone not effective for pain control" with last RTKA  . Other     Opiods,causes severe anxiety    Current Problems (verified) Patient Active Problem List   Diagnosis Date Noted  . Other abnormal glucose 05/19/2015  . Osteopenia 05/19/2015  . Obesity (BMI 30.0-34.9) 05/10/2014  . Hyperlipidemia   . Hypertension   . Hypothyroidism due to Hashimoto's thyroiditis   . Depression, major, in remission ([place])   . Vitamin D deficiency   . OA (osteoarthritis) of knee 01/03/2012    Screening Tests Immunization  History  Administered Date(s) Administered  . Influenza Split 05/10/2014, 05/19/2015  . Influenza, High Dose Seasonal PF 07/02/2018  . Pneumococcal Polysaccharide-23 03/14/2018  . Td 06/02/2006  . Tdap 03/14/2018   Tetanus: 2007, 2019 Pneumovax: 03/2018  Prevnar 13: due 03/2019 [facility] Zostavax: declines  Pap: 03/2018, never abnormal, HPV neg, DONE MGM: 07/2018 DEXA: 07/2018, T-2.4 L fem, on evista Colonoscopy: 2007 due 2017, Scheduled with Dr. Henrene Pastor in April 2020 EGD: N/A CTA 09/2012  Names of Other Physician/Practitioners you currently use: 1. [facility] here for primary care 2. Vision: Dr. Bing Plume, last visit 2019 3. Dental: Dr. Marquette Saa, last visit 07/2018, goes q24m   [name], [name] [name], MD as Consulting Physician (Orthopedic Surgery) [name], MD as Consulting Physician (Ophthalmology) [name], MD as Consulting Physician (Gastroenterology)  SURGICAL HISTORY She  has a past surgical history that includes c sections (jan 1980 and march 1984); ovarian cyst with lararoscopy march 1998 (1998); Total knee arthroplasty (01/03/2012); Wisdom tooth extraction (09-05-12); Total knee arthroplasty (Left, 09/11/2012); and Cataract extraction, bilateral (Bilateral,  2016). FAMILY HISTORY Her family history includes Arthritis in her mother; Breast cancer in her maternal grandmother and paternal grandmother; COPD in her father; Cancer in her mother; Hypertension in her mother; Stroke (age of onset: 25) in her maternal grandmother; Thyroid disease in her mother. SOCIAL HISTORY She  reports that she has never smoked. She has never used smokeless tobacco. She reports current alcohol use. She reports that she does not use drugs.   MEDICARE WELLNESS OBJECTIVES: Physical activity:   Cardiac risk factors:   Depression/mood screen:   Depression screen [facility] 2/9  10/04/2018  Decreased Interest 0  Down, Depressed, Hopeless 1  PHQ - 2 Score 1  Altered sleeping 3  Tired, decreased energy 3  Change in appetite 1  Feeling bad or failure about yourself  0  Trouble concentrating 2  Moving slowly or fidgety/restless 0  Suicidal thoughts 0  PHQ-9 Score 10  Difficult doing work/chores Somewhat difficult    ADLs:  No flowsheet data found.   Cognitive Testing  Alert? Yes  Normal Appearance?Yes  Oriented to person? Yes  Place? Yes   Time? Yes  Recall of three objects?  Yes  Can perform simple calculations? Yes  Displays appropriate judgment?Yes  Can read the correct time from a watch face?Yes  EOL planning: Does Patient Have a Medical Advance Directive?: Yes Type of Advance Directive: Healthcare Power of Attorney, Living will Does patient want to make changes to medical advance directive?: No - Patient declined Copy of [place] in Chart?: No - copy requested  Review of Systems  Constitutional: Positive for malaise/fatigue. Negative for weight loss.  HENT: Negative for hearing loss and tinnitus.   Eyes: Negative for blurred vision and double vision.  Respiratory: Negative for cough, sputum production, shortness of breath and wheezing.   Cardiovascular: Negative for chest pain, palpitations, orthopnea, claudication, leg swelling and PND.  Gastrointestinal: Negative for abdominal pain, blood in stool, constipation, diarrhea, heartburn, melena, nausea and vomiting.  Genitourinary: Negative.   Musculoskeletal: Negative for falls, joint pain and myalgias.  Skin: Negative for rash.  Neurological: Negative for dizziness, tingling, sensory change, weakness and headaches.  Endo/Heme/Allergies: Negative for polydipsia.  Psychiatric/Behavioral: Positive for depression. Negative for memory loss, substance abuse and suicidal ideas. The patient is not nervous/anxious and does not have insomnia.   All other systems reviewed and are  negative.    Objective:     Today's Vitals   10/04/18 1531  BP: 124/78  Pulse: 74  Temp: (!) 97.5 F (36.4 C)  SpO2: 98%  Weight: 180 lb 9.6 oz (81.9 kg)  Height: 4\' 11"  (1.499 m)  PainSc: 2   PainLoc: Hip   Body mass index is 36.48 kg/m.  General appearance: alert, no distress, WD/WN, female HEENT: normocephalic, sclerae anicteric, TMs pearly, nares patent, no discharge or erythema, pharynx normal Oral cavity: MMM, no lesions Neck: supple, no lymphadenopathy, no thyromegaly, no masses Heart: RRR, normal S1, S2, no murmurs Lungs: CTA bilaterally, no wheezes, rhonchi, or rales Abdomen: +bs, soft, non tender, non distended, no masses, no hepatomegaly, no splenomegaly Musculoskeletal: nontender, no swelling, no obvious deformity Extremities: no edema, no cyanosis, no clubbing Pulses: 2+ symmetric, upper and lower extremities, normal cap refill Neurological: alert, oriented x 3, CN2-12 intact, strength normal upper extremities and lower extremities, sensation normal throughout, DTRs 2+ throughout, no cerebellar signs, gait normal Psychiatric: normal affect, behavior normal, pleasant   EKG: WNL, NSCPT AAA [place]: negative  Medicare Attestation I have personally reviewed: The  patient's medical and social history Their use of alcohol, tobacco or illicit drugs Their current medications and supplements The patient's functional ability including ADLs,fall risks, home safety risks, cognitive, and hearing and visual impairment Diet and physical activities Evidence for depression or mood disorders  The patient's weight, height, BMI, and visual acuity have been recorded in the chart.  I have made referrals, counseling, and provided education to the patient based on review of the above and I have provided the patient with a written personalized care plan for preventive services.     Izora Ribas, NP   10/04/2018

## 2018-10-04 ENCOUNTER — Ambulatory Visit (INDEPENDENT_AMBULATORY_CARE_PROVIDER_SITE_OTHER): Payer: Medicare Other | Admitting: Adult Health

## 2018-10-04 ENCOUNTER — Encounter: Payer: Self-pay | Admitting: Adult Health

## 2018-10-04 ENCOUNTER — Encounter: Payer: Self-pay | Admitting: Internal Medicine

## 2018-10-04 VITALS — BP 124/78 | HR 74 | Temp 97.5°F | Ht 59.0 in | Wt 180.6 lb

## 2018-10-04 DIAGNOSIS — Z0000 Encounter for general adult medical examination without abnormal findings: Secondary | ICD-10-CM

## 2018-10-04 DIAGNOSIS — I10 Essential (primary) hypertension: Secondary | ICD-10-CM

## 2018-10-04 DIAGNOSIS — E785 Hyperlipidemia, unspecified: Secondary | ICD-10-CM

## 2018-10-04 DIAGNOSIS — Z136 Encounter for screening for cardiovascular disorders: Secondary | ICD-10-CM

## 2018-10-04 DIAGNOSIS — E559 Vitamin D deficiency, unspecified: Secondary | ICD-10-CM

## 2018-10-04 DIAGNOSIS — E063 Autoimmune thyroiditis: Secondary | ICD-10-CM

## 2018-10-04 DIAGNOSIS — E038 Other specified hypothyroidism: Secondary | ICD-10-CM | POA: Diagnosis not present

## 2018-10-04 DIAGNOSIS — R6889 Other general symptoms and signs: Secondary | ICD-10-CM | POA: Diagnosis not present

## 2018-10-04 DIAGNOSIS — F325 Major depressive disorder, single episode, in full remission: Secondary | ICD-10-CM

## 2018-10-04 DIAGNOSIS — Z79899 Other long term (current) drug therapy: Secondary | ICD-10-CM

## 2018-10-04 DIAGNOSIS — R7309 Other abnormal glucose: Secondary | ICD-10-CM | POA: Diagnosis not present

## 2018-10-04 DIAGNOSIS — M170 Bilateral primary osteoarthritis of knee: Secondary | ICD-10-CM | POA: Diagnosis not present

## 2018-10-04 DIAGNOSIS — E669 Obesity, unspecified: Secondary | ICD-10-CM | POA: Diagnosis not present

## 2018-10-04 DIAGNOSIS — M8580 Other specified disorders of bone density and structure, unspecified site: Secondary | ICD-10-CM

## 2018-10-04 DIAGNOSIS — Z0001 Encounter for general adult medical examination with abnormal findings: Secondary | ICD-10-CM | POA: Diagnosis not present

## 2018-10-04 MED ORDER — DULOXETINE HCL 30 MG PO CPEP: ORAL_CAPSULE | ORAL | 1 refills | Status: DC

## 2018-10-04 NOTE — Patient Instructions (Signed)
  Ms. Bourbeau , Thank you for taking time to come for your Medicare Wellness Visit. I appreciate your ongoing commitment to your health goals. Please review the following plan we discussed and let me know if I can assist you in the future.   This is a list of the screening recommended for you and due dates:  Health Maintenance  Topic Date Due  . Colon Cancer Screening  01/12/2016  . Pneumonia vaccines (2 of 2 - PCV13) 03/15/2019  . Mammogram  07/21/2020  . Pap Smear  03/14/2021  . Tetanus Vaccine  03/14/2028  . Flu Shot  Completed  . DEXA scan (bone density measurement)  Completed  .  Hepatitis C: One time screening is recommended by Center for Disease Control  (CDC) for  adults born from [birth year] through 1963.   Completed  . HIV Screening  Completed     Aim for 7+ servings of fruits and vegetables daily  65-80+ fluid ounces of water or unsweet tea for healthy kidneys  Limit to max 1 drink of alcohol per day; avoid smoking/tobacco  Limit animal fats in diet for cholesterol and heart health - choose grass fed whenever available  Avoid highly processed foods, and foods high in saturated/trans fats  Aim for low stress - take time to unwind and care for your mental health  Aim for 150 min of moderate intensity exercise weekly for heart health, and weights twice weekly for bone health  Aim for 7-9 hours of sleep daily

## 2018-10-05 ENCOUNTER — Telehealth: Payer: Self-pay

## 2018-10-05 LAB — LIPID PANEL
CHOL/HDL RATIO: 2.3 (calc) (ref <5.0)
CHOLESTEROL: 190 mg/dL (ref <200)
HDL: 81 mg/dL (ref >=50)
LDL Cholesterol (Calc): 95 mg/dL (calc)
Non-HDL Cholesterol (Calc): 109 mg/dL (calc) (ref <130)
Triglycerides: 62 mg/dL (ref <150)

## 2018-10-05 LAB — CBC WITH DIFFERENTIAL/PLATELET
ABSOLUTE MONOCYTES: 846 {cells}/uL (ref 200–950)
Basophils Absolute: 62 cells/uL (ref 0–200)
Basophils Relative: 0.7 %
EOS PCT: 3.1 %
Eosinophils Absolute: 276 cells/uL (ref 15–500)
HEMATOCRIT: 39.7 % (ref 35.0–45.0)
HEMOGLOBIN: 13.7 g/dL (ref 11.7–15.5)
LYMPHS ABS: 2750 {cells}/uL (ref 850–3900)
MCH: 31.2 pg (ref 27.0–33.0)
MCHC: 34.5 g/dL (ref 32.0–36.0)
MCV: 90.4 fL (ref 80.0–100.0)
MPV: 9.6 fL (ref 7.5–12.5)
Monocytes Relative: 9.5 %
NEUTROS ABS: 4966 {cells}/uL (ref 1500–7800)
NEUTROS PCT: 55.8 %
PLATELETS: 317 10*3/uL (ref 140–400)
RBC: 4.39 10*6/uL (ref 3.80–5.10)
RDW: 12.2 % (ref 11.0–15.0)
Total Lymphocyte: 30.9 %
WBC: 8.9 10*3/uL (ref 3.8–10.8)

## 2018-10-05 LAB — COMPLETE METABOLIC PANEL WITH GFR
AG Ratio: 1.5 (calc) (ref 1.0–2.5)
ALKALINE PHOSPHATASE (APISO): 80 U/L (ref 37–153)
ALT: 20 U/L (ref 6–29)
AST: 23 U/L (ref 10–35)
Albumin: 4.3 g/dL (ref 3.6–5.1)
BUN: 11 mg/dL (ref 7–25)
CALCIUM: 9.5 mg/dL (ref 8.6–10.4)
CO2: 28 mmol/L (ref 20–32)
CREATININE: 0.79 mg/dL (ref 0.50–0.99)
Chloride: 104 mmol/L (ref 98–110)
GFR, Est African American: 91 mL/min/{1.73_m2} (ref >=60)
GFR, Est Non African American: 79 mL/min/{1.73_m2} (ref >=60)
GLOBULIN: 2.9 g/dL (ref 1.9–3.7)
Glucose, Bld: 90 mg/dL (ref 65–99)
Potassium: 4.1 mmol/L (ref 3.5–5.3)
SODIUM: 141 mmol/L (ref 135–146)
Total Bilirubin: 0.4 mg/dL (ref 0.2–1.2)
Total Protein: 7.2 g/dL (ref 6.1–8.1)

## 2018-10-05 LAB — TSH: TSH: 2.33 mIU/L (ref 0.40–4.50)

## 2018-10-05 MED ORDER — DULOXETINE HCL 20 MG PO CPEP: 20.0000 mg | ORAL_CAPSULE | Freq: Two times a day (BID) | ORAL | 0 refills | Status: DC

## 2018-10-05 NOTE — Telephone Encounter (Signed)
Patient has concerns with her Cymbalta dosage, wants to see if there are other options.

## 2018-10-05 NOTE — Telephone Encounter (Signed)
Patient states that she wants to stay on the cymbalta, just would like to increase from 30mg  to 20mg  BID. Provider aware and will send in prescription for this.

## 2018-10-05 NOTE — Addendum Note (Signed)
Addended by: Chancy Hurter on: 10/05/2018 02:34 PM   Modules accepted: Orders

## 2018-10-18 ENCOUNTER — Other Ambulatory Visit: Payer: Self-pay | Admitting: Adult Health

## 2018-10-18 MED ORDER — DULOXETINE HCL 20 MG PO CPEP: 40.0000 mg | ORAL_CAPSULE | Freq: Every day | ORAL | 0 refills | Status: DC

## 2018-11-06 ENCOUNTER — Encounter: Payer: Medicare Other | Admitting: Internal Medicine

## 2018-11-20 ENCOUNTER — Other Ambulatory Visit: Payer: Self-pay | Admitting: Adult Health

## 2018-11-20 MED ORDER — DULOXETINE HCL 60 MG PO CPEP: 60.0000 mg | ORAL_CAPSULE | Freq: Every day | ORAL | 1 refills | Status: DC

## 2018-11-27 ENCOUNTER — Encounter: Payer: Medicare Other | Admitting: Internal Medicine

## 2018-12-07 ENCOUNTER — Other Ambulatory Visit: Payer: Self-pay | Admitting: Adult Health

## 2018-12-07 DIAGNOSIS — F325 Major depressive disorder, single episode, in full remission: Secondary | ICD-10-CM

## 2018-12-07 MED ORDER — DULOXETINE HCL 60 MG PO CPEP: 60.0000 mg | ORAL_CAPSULE | Freq: Every day | ORAL | 1 refills | Status: DC

## 2018-12-13 ENCOUNTER — Telehealth: Payer: Self-pay | Admitting: *Deleted

## 2018-12-13 NOTE — Telephone Encounter (Signed)
Colonoscopy scheduled for 01-03-19 at 1:30 p.m.  PV scheduled for 12-20-18 at 1:00.  Pt is aware that PV will be over the phone. Insurance card is up to date.

## 2018-12-20 ENCOUNTER — Ambulatory Visit (AMBULATORY_SURGERY_CENTER): Payer: Self-pay | Admitting: *Deleted

## 2018-12-20 ENCOUNTER — Other Ambulatory Visit: Payer: Self-pay

## 2018-12-20 VITALS — Ht 60.0 in | Wt 175.0 lb

## 2018-12-20 DIAGNOSIS — Z1211 Encounter for screening for malignant neoplasm of colon: Secondary | ICD-10-CM

## 2018-12-20 MED ORDER — NA SULFATE-K SULFATE-MG SULF 17.5-3.13-1.6 GM/177ML PO SOLN: ORAL | 0 refills | Status: DC

## 2018-12-20 NOTE — Progress Notes (Signed)
Pt's previsit is done over the phone and all paperwork (prep instructions, blank consent form, pre-procedure acknowledgement form and stamped envelope) sent to patient  No egg or soy allergy  No home oxygen used or hx of sleep apnea  No intubation or anesthesia problems per pt  No diet medications taken  Registered in [place]

## 2019-01-01 ENCOUNTER — Telehealth: Payer: Self-pay

## 2019-01-01 NOTE — Telephone Encounter (Signed)
Covid-19 screening questions  Have you traveled in the last 14 days? No. If yes where?  Do you now or have you had a fever in the last 14 days? No.  Do you have any respiratory symptoms of shortness of breath or cough now or in the last 14 days? No.  Do you have any family members or close contacts with diagnosed or suspected Covid-19 in the past 14 days? No.  Have you been tested for Covid-19 and found to be positive? No.       

## 2019-01-03 ENCOUNTER — Ambulatory Visit (AMBULATORY_SURGERY_CENTER): Payer: Medicare Other | Admitting: Internal Medicine

## 2019-01-03 ENCOUNTER — Other Ambulatory Visit: Payer: Self-pay

## 2019-01-03 ENCOUNTER — Encounter: Payer: Self-pay | Admitting: Internal Medicine

## 2019-01-03 VITALS — BP 132/68 | HR 76 | Temp 98.9°F | Resp 14 | Ht 60.0 in | Wt 175.0 lb

## 2019-01-03 DIAGNOSIS — Z1211 Encounter for screening for malignant neoplasm of colon: Secondary | ICD-10-CM | POA: Diagnosis not present

## 2019-01-03 MED ORDER — SODIUM CHLORIDE 0.9 % IV SOLN: 500.0000 mL | Freq: Once | INTRAVENOUS | Status: DC

## 2019-01-03 NOTE — Patient Instructions (Signed)

## 2019-01-03 NOTE — Progress Notes (Signed)
Pt's states no medical or surgical changes since previsit or office visit. 

## 2019-01-03 NOTE — Op Note (Signed)
[facility] Patient Name: Michelle Miranda Procedure Date: 01/03/2019 1:21 PM MRN: [id number] Endoscopist: Docia Chuck. Henrene Pastor , MD Age: 68 Referring MD:  Date of Birth: 1951/02/01 Gender: Female Account #: [id number] Procedure:                Colonoscopy Indications:              Screening for colorectal malignant neoplasm.                            Negative index exam 2007 with Dr. Earlean Shawl Medicines:                Monitored Anesthesia Care Procedure:                Pre-Anesthesia Assessment:                           - Prior to the procedure, a History and Physical                            was performed, and patient medications and                            allergies were reviewed. The patient's tolerance of                            previous anesthesia was also reviewed. The risks                            and benefits of the procedure and the sedation                            options and risks were discussed with the patient.                            All questions were answered, and informed consent                            was obtained. Prior Anticoagulants: The patient has                            taken no previous anticoagulant or antiplatelet                            agents. ASA Grade Assessment: II - A patient with                            mild systemic disease. After reviewing the risks                            and benefits, the patient was deemed in                            satisfactory condition to undergo the procedure.  After obtaining informed consent, the colonoscope                            was passed under direct vision. Throughout the                            procedure, the patient's blood pressure, pulse, and                            oxygen saturations were monitored continuously. The                            Colonoscope was introduced through the anus and                            advanced to the the cecum,  identified by                            appendiceal orifice and ileocecal valve. The                            ileocecal valve, appendiceal orifice, and rectum                            were photographed. The quality of the bowel                            preparation was excellent. The colonoscopy was                            performed without difficulty. The patient tolerated                            the procedure well. The bowel preparation used was                            SUPREP via split dose instruction. Scope In: 1:50:00 PM Scope Out: 2:01:28 PM Scope Withdrawal Time: 0 hours 8 minutes 2 seconds  Total Procedure Duration: 0 hours 11 minutes 28 seconds  Findings:                 Multiple diverticula were found in the sigmoid                            colon.                           The exam was otherwise without abnormality on                            direct and retroflexion views. Complications:            No immediate complications. Estimated blood loss:                            None. Estimated  Blood Loss:     Estimated blood loss: none. Impression:               - Diverticulosis in the sigmoid colon.                           - The examination was otherwise normal on direct                            and retroflexion views.                           - No specimens collected. Recommendation:           - Repeat colonoscopy in 10 years for screening                            purposes.                           - Patient has a contact number available for                            emergencies. The signs and symptoms of potential                            delayed complications were discussed with the                            patient. Return to normal activities tomorrow.                            Written discharge instructions were provided to the                            patient.                           - Resume previous diet.                           -  Continue present medications. Docia Chuck. Henrene Pastor, MD 01/03/2019 2:06:20 PM This report has been signed electronically.

## 2019-01-03 NOTE — Progress Notes (Signed)
A and O x3. Report to RN. Tolerated MAC anesthesia well.

## 2019-01-05 ENCOUNTER — Telehealth: Payer: Self-pay | Admitting: *Deleted

## 2019-01-05 NOTE — Telephone Encounter (Signed)
  Follow up Call-  Call back number 01/03/2019 01/03/2019  Post procedure Call Back phone  # [phone] -  Permission to leave phone message Yes Yes  Some recent data might be hidden     Patient questions:  Do you have a fever, pain , or abdominal swelling? No. Pain Score  0 *  Have you tolerated food without any problems? Yes  Have you been able to return to your normal activities? Yes  Do you have any questions about your discharge instructions: Diet   No. Medications  No. Follow up visit  No.  Do you have questions or concerns about your Care? No.  Actions: * If pain score is 4 or above: No action needed, pain <4.  1. Have you developed a fever since your procedure? No  2.   Have you had an respiratory symptoms (SOB or cough) since your procedure? No  3.   Have you tested positive for COVID 19 since your procedure No  4.   Have you had any family members/close contacts diagnosed with the COVID 19 since your procedure?  No   If yes to any of these questions please route to Joylene John, RN and Alphonsa Gin, RN.

## 2019-02-12 ENCOUNTER — Encounter: Payer: Self-pay | Admitting: Internal Medicine

## 2019-02-15 ENCOUNTER — Encounter: Payer: Self-pay | Admitting: Internal Medicine

## 2019-02-15 ENCOUNTER — Ambulatory Visit (INDEPENDENT_AMBULATORY_CARE_PROVIDER_SITE_OTHER): Payer: Medicare Other | Admitting: Internal Medicine

## 2019-02-15 ENCOUNTER — Other Ambulatory Visit: Payer: Self-pay

## 2019-02-15 VITALS — BP 148/80 | HR 90 | Ht 59.55 in | Wt 182.0 lb

## 2019-02-15 DIAGNOSIS — E063 Autoimmune thyroiditis: Secondary | ICD-10-CM | POA: Diagnosis not present

## 2019-02-15 DIAGNOSIS — E038 Other specified hypothyroidism: Secondary | ICD-10-CM | POA: Diagnosis not present

## 2019-02-15 NOTE — Progress Notes (Signed)
Patient ID: Michelle Miranda, female   DOB: May 07, 1951, 68 y.o.   MRN: [id number]    HPI  Michelle Miranda is a 68 y.o.-year-old female, initially referred by her PCP, Vicie Mutters, NP (Dr. Melford Aase), returning for follow-up for Hashimoto's hypothyroidism. Last visit 1 year ago.  At this visit, she complains about hair loss, dry skin, weight gain, heat intolerance and increased sweating, hand itching, enlarged tongue, and significant fatigue.  Her labs were checked recently and they were normal. She increased Cymbalta to 60 mg this year >> still having panic attacks.  Reviewed and addended history: Pt. has been dx with hypothyroidism in 2012 and Hashimoto's thyroiditis in 04/2016 >> prev. On Levothyroxine, then on Synthroid d.a.w. 37.5 mcg 6/7 and 75 mcg 1/7 (changed in 01/2016).  Latest thyroid dose change was to 50 mcg daily and she has maintained normal TFTs afterwards.    At last visit we changed from brand name to generic levothyroxine due to price.  Pt is on levothyroxine 50 Mcg daily, taken: - in am - fasting - Drinks coffee + creamer 1 hour later - at least 30 min from b'fast - no Ca, Fe, PPIs - + Multivitamins more than 4 hours later - on Biotin -last dose yesterday - ? dose  She was on selenium 200 mcg daily >> now off. She is on vitamin D3 5000 units daily  Reviewed patient's TFTs: Lab Results  Component Value Date   TSH 2.33 10/04/2018   TSH 1.32 02/10/2018   TSH 1.62 02/10/2017   TSH 2.08 07/06/2016   TSH 2.02 05/18/2016   TSH 1.73 04/09/2016   TSH 5.15 (H) 02/23/2016   TSH 1.811 05/19/2015   TSH 2.250 02/11/2015   TSH 4.333 01/01/2015   FREET4 0.90 02/10/2018   FREET4 0.74 02/10/2017   FREET4 0.70 05/18/2016   FREET4 0.79 04/09/2016    She has Hashimoto's thyroiditis on the basis of slightly elevated TPO antibodies: Component     Latest Ref Rng & Units 04/09/2016  Thyroperoxidase Ab SerPl-aCnc     <9 IU/mL 9 (H)  Thyroglobulin Ab     <2 IU/mL <1   Pt  denies: - feeling nodules in neck - hoarseness - dysphagia - SOB with lying down She has been having occasional choking for many years, feels like a muscle spasm.  She has + FH of thyroid disorders in: mother - hypothyroid. No FH of thyroid cancer. No h/o radiation tx to head or neck.  No herbal supplements. No Biotin use. No recent steroids use.   ROS: Constitutional: + weight gain/no weight loss, + see HPI Eyes: no blurry vision, no xerophthalmia ENT: no sore throat, + see HPI Cardiovascular: no CP/no SOB/no palpitations/no leg swelling Respiratory: no cough/no SOB/no wheezing Gastrointestinal: no N/no V/no D/no C/no acid reflux Musculoskeletal: no muscle aches/no joint aches Skin: no rashes, + hair loss Neurological: + tremors/no numbness/no tingling/no dizziness  I reviewed pt's medications, allergies, PMH, social hx, family hx, and changes were documented in the history of present illness. Otherwise, unchanged from my initial visit note.  Past Medical History:  Diagnosis Date  . Anxiety    occasional  . Arthritis   . Depression   . Hyperlipidemia   . Hypertension   . Hypothyroid    hypo thyroid  . Osteopenia   . Urinary tract infection 12/15/2011   once  . Vitamin D deficiency    Past Surgical History:  Procedure Laterality Date  . c sections  [place]  and march 1984  . CATARACT EXTRACTION, BILATERAL Bilateral 2016   Dr. Bing Plume  . COLONOSCOPY    . ovarian cyst with lararoscopy march 1998  1998  . TOTAL KNEE ARTHROPLASTY  01/03/2012   Procedure: TOTAL KNEE ARTHROPLASTY;  Surgeon: Gearlean Alf, MD;  Location: [facility];  Service: Orthopedics;  Laterality: Right;  . TOTAL KNEE ARTHROPLASTY Left 09/11/2012   Procedure: Left Total Knee Arthroplasty;  Surgeon: Gearlean Alf, MD;  Location: [facility];  Service: Orthopedics;  Laterality: Left;  . WISDOM TOOTH EXTRACTION  09-05-12   extractions   Social History   Social History  . Marital status: Married    Spouse name:  N/A  . Number of children: 2   Occupational History  . Law office admin   Social History Main Topics  . Smoking status: Never Smoker  . Smokeless tobacco: Never Used  . Alcohol use Yes     Comment: 1-2 glass wine night  . Drug use: No   Current Outpatient Medications on File Prior to Visit  Medication Sig Dispense Refill  . Cholecalciferol (VITAMIN D3) 5000 units CAPS Take by mouth.    . DULoxetine (CYMBALTA) 60 MG capsule Take 1 capsule (60 mg total) by mouth daily. 90 capsule 1  . levothyroxine (SYNTHROID, LEVOTHROID) 50 MCG tablet Take 1 tablet (50 mcg total) by mouth daily. 90 tablet 3  . Methylcobalamin (B-12 FAST DISSOLVE SL) Place under the tongue daily.    . Multiple Vitamin (MULTIVITAMIN) tablet Take 1 tablet by mouth daily.    . raloxifene (EVISTA) 60 MG tablet TAKE 1 TABLET BY MOUTH EVERY DAY FOR OSTEOPOROSIS 90 tablet 3   Current Facility-Administered Medications on File Prior to Visit  Medication Dose Route Frequency Provider Last Rate Last Dose  . 0.9 %  sodium chloride infusion  500 mL Intravenous Once Irene Shipper, MD       Allergies  Allergen Reactions  . Oxycodone     "Found Oxycodone not effective for pain control" with last RTKA  . Other     Opiods,causes severe anxiety   Family History  Problem Relation Age of Onset  . Hypertension Mother   . Thyroid disease Mother   . Arthritis Mother   . Cancer Mother        gallbladder cancer  . COPD Father   . Breast cancer Maternal Grandmother        38s  . Stroke Maternal Grandmother 32  . Breast cancer Paternal Grandmother   . Colon cancer Neg Hx   . Esophageal cancer Neg Hx   . Stomach cancer Neg Hx   . Rectal cancer Neg Hx    PE: BP (!) 148/80   Pulse 90   Ht 4' 11.55" (1.513 m) Comment: measured without shoes  Wt 182 lb (82.6 kg)   SpO2 97%   BMI 36.09 kg/m  Wt Readings from Last 3 Encounters:  02/15/19 182 lb (82.6 kg)  01/03/19 175 lb (79.4 kg)  12/20/18 175 lb (79.4 kg)    Constitutional: overweight, in NAD Eyes: PERRLA, EOMI, no exophthalmos ENT: moist mucous membranes, no thyromegaly, no cervical lymphadenopathy Cardiovascular: RRR, No MRG Respiratory: CTA B Gastrointestinal: abdomen soft, NT, ND, BS+ Musculoskeletal: no deformities, strength intact in all 4 Skin: moist, warm, no rashes Neurological: + Chronic tremor with outstretched hands, DTR normal in all 4  ASSESSMENT: 1. Hypothyroidism 2/2 Hashimoto's thyroiditis  PLAN:  1. Patient with longstanding Hashimoto's hypothyroidism, on levothyroxine, now generic due to price -  latest thyroid labs reviewed with pt >> normal in 10/2018 - she continues on LT4 50 mcg daily - pt has multiple complaints at this visit (please see HPI) and she would like to add T3 to her regimen.  We discussed about this at last visit and again today explained that patients are required such a low dose of levothyroxine, do not usually benefit from adding T3, however, she still wants to try this.  In that case, we can add Armour.  We discussed pluses and minuses of starting this and answered her questions about different combination regimens.  If no dose change needed today, we can start Armour 1/2 of a 90 mcg tablet (45 mcg daily) - we discussed about taking the thyroid hormone every day, with water, >30 minutes before breakfast, separated by >4 hours from acid reflux medications, calcium, iron, multivitamins. Pt. is taking it correctly. - will check thyroid tests today: TSH, free T3 and fT4.  However, she is not sure what dose of biotin she is taking.  She can either be taken 500 or 5000 mcg.  She will go home to look in the return for labs. -After we changed to Armour, she will need to return for repeat TFTs in 1.5 months -I will see her back for another visit in 6 months.  - time spent with the patient: 25 minutes, of which >50% was spent in obtaining information about her symptoms, reviewing her previous labs, evaluations, and  treatments, counseling her about her condition (please see the discussed topics above), and developing a plan to further investigate and treat it; she had a number of questions which I addressed.  Addendum: -Patient contacted That she reached that her dose of biotin is 5000 mcg.  Therefore, we need to wait approximately 10 days before we recheck her labs.   Philemon Kingdom, MD PhD [facility] Endocrin[facility]

## 2019-02-15 NOTE — Patient Instructions (Signed)
Please stop at the lab.  We will change to Armour when the results are back.  Please come back for a follow-up appointment in 6 months.

## 2019-02-16 ENCOUNTER — Other Ambulatory Visit: Payer: Self-pay | Admitting: Internal Medicine

## 2019-02-22 ENCOUNTER — Other Ambulatory Visit: Payer: Self-pay | Admitting: Internal Medicine

## 2019-02-26 ENCOUNTER — Other Ambulatory Visit (INDEPENDENT_AMBULATORY_CARE_PROVIDER_SITE_OTHER): Payer: Medicare Other

## 2019-02-26 ENCOUNTER — Encounter: Payer: Self-pay | Admitting: Internal Medicine

## 2019-02-26 ENCOUNTER — Other Ambulatory Visit: Payer: Self-pay

## 2019-02-26 DIAGNOSIS — E038 Other specified hypothyroidism: Secondary | ICD-10-CM

## 2019-02-26 DIAGNOSIS — E063 Autoimmune thyroiditis: Secondary | ICD-10-CM | POA: Diagnosis not present

## 2019-02-26 LAB — T4, FREE: Free T4: 0.76 ng/dL (ref 0.60–1.60)

## 2019-02-26 LAB — T3, FREE: T3, Free: 3.1 pg/mL (ref 2.3–4.2)

## 2019-02-26 LAB — TSH: TSH: 2.77 u[IU]/mL (ref 0.35–4.50)

## 2019-03-06 ENCOUNTER — Other Ambulatory Visit: Payer: Self-pay | Admitting: Adult Health

## 2019-03-14 ENCOUNTER — Encounter: Payer: Self-pay | Admitting: Internal Medicine

## 2019-03-14 ENCOUNTER — Other Ambulatory Visit: Payer: Self-pay

## 2019-03-14 DIAGNOSIS — F325 Major depressive disorder, single episode, in full remission: Secondary | ICD-10-CM

## 2019-03-14 MED ORDER — DULOXETINE HCL 60 MG PO CPEP: 60.0000 mg | ORAL_CAPSULE | Freq: Every day | ORAL | 1 refills | Status: DC

## 2019-03-14 NOTE — Telephone Encounter (Signed)
Please review labs and advise.

## 2019-03-15 ENCOUNTER — Encounter: Payer: Self-pay | Admitting: Adult Health

## 2019-03-23 ENCOUNTER — Encounter: Payer: Self-pay | Admitting: Internal Medicine

## 2019-03-23 ENCOUNTER — Other Ambulatory Visit: Payer: Self-pay | Admitting: Internal Medicine

## 2019-03-23 DIAGNOSIS — E038 Other specified hypothyroidism: Secondary | ICD-10-CM

## 2019-03-23 MED ORDER — ARMOUR THYROID 90 MG PO TABS: 45.0000 mg | ORAL_TABLET | Freq: Every day | ORAL | 2 refills | Status: DC

## 2019-03-26 ENCOUNTER — Encounter: Payer: Self-pay | Admitting: Internal Medicine

## 2019-04-12 DIAGNOSIS — Z23 Encounter for immunization: Secondary | ICD-10-CM | POA: Diagnosis not present

## 2019-04-22 ENCOUNTER — Encounter: Payer: Self-pay | Admitting: Internal Medicine

## 2019-04-23 ENCOUNTER — Other Ambulatory Visit: Payer: Self-pay | Admitting: Internal Medicine

## 2019-04-23 MED ORDER — ARMOUR THYROID 90 MG PO TABS: 45.0000 mg | ORAL_TABLET | Freq: Every day | ORAL | 2 refills | Status: DC

## 2019-05-16 DIAGNOSIS — H35373 Puckering of macula, bilateral: Secondary | ICD-10-CM | POA: Diagnosis not present

## 2019-05-16 DIAGNOSIS — H524 Presbyopia: Secondary | ICD-10-CM | POA: Diagnosis not present

## 2019-05-16 DIAGNOSIS — H43812 Vitreous degeneration, left eye: Secondary | ICD-10-CM | POA: Diagnosis not present

## 2019-05-16 DIAGNOSIS — Z961 Presence of intraocular lens: Secondary | ICD-10-CM | POA: Diagnosis not present

## 2019-05-28 ENCOUNTER — Encounter: Payer: Medicare Other | Admitting: Internal Medicine

## 2019-06-11 ENCOUNTER — Encounter: Payer: Self-pay | Admitting: Internal Medicine

## 2019-06-13 ENCOUNTER — Other Ambulatory Visit: Payer: Self-pay

## 2019-06-13 ENCOUNTER — Other Ambulatory Visit (INDEPENDENT_AMBULATORY_CARE_PROVIDER_SITE_OTHER): Payer: Medicare Other

## 2019-06-13 DIAGNOSIS — E063 Autoimmune thyroiditis: Secondary | ICD-10-CM | POA: Diagnosis not present

## 2019-06-13 DIAGNOSIS — E038 Other specified hypothyroidism: Secondary | ICD-10-CM | POA: Diagnosis not present

## 2019-06-13 LAB — T3, FREE: T3, Free: 3.6 pg/mL (ref 2.3–4.2)

## 2019-06-13 LAB — TSH: TSH: 2.5 u[IU]/mL (ref 0.35–4.50)

## 2019-06-13 LAB — T4, FREE: Free T4: 0.61 ng/dL (ref 0.60–1.60)

## 2019-06-18 NOTE — Progress Notes (Signed)
Assessment and Plan:  Michelle Miranda was seen today for acute visit.  Diagnoses and all orders for this visit:  Dermatitis of unknown etiology -     predniSONE (DELTASONE) 20 MG tablet; 1 pill 3 x a day for 3 days, 1 pill 2 x a day x 3 days, 1 pill a day x 5 days with food  Patient descriptors sound of shingles vs cervical neck etiology? Or acute dermititis  Discussed treating with prednisone.  If symptoms return or change please contact office.   Further disposition pending results of labs. Discussed med's effects and SE's.   Over 30 minutes of exam, counseling, chart review, and critical decision making was performed.   Future Appointments  Date Time Provider [place]  07/09/2019  3:00 PM Unk Pinto, MD [facility] None  08/02/2019  4:30 PM [facility] MM 2 [facility]  08/23/2019  3:00 PM Philemon Kingdom, MD [facility] None  10/17/2019  3:00 PM Liane Comber, NP [facility] None    ------------------------------------------------------------------------------------------------------------------   HPI 68 y.o.female presents for Itchy painful rash that causes severe itching.  No signs of rash.  She was extremely itching and feels like burning form the inside.  She reports several episodes of the itching on the right shoulder.  Reports that it lasts anywhere from 10-59min.  It is intermittent in episodes and she is unable to pinpoint triggers.  Denies increased sensitivity to touch or clothing.  She has used rubbing alchohol and that helped.  Reports that it is so intense when it happens.  It is more painful than itchy once burning starts.  She denies any numbess/tingling/shooting pains to extremities, neck pain, back pains, headaches, increased sensitivity, blisters or rash or fever.  Denies change any any personal care products, laundry soap, new clothing or exposure to new animal/pets.   Past Medical History:  Diagnosis Date  . Anxiety    occasional  . Arthritis    . Depression   . Hyperlipidemia   . Hypertension   . Hypothyroid    hypo thyroid  . Osteopenia   . Urinary tract infection 12/15/2011   once  . Vitamin D deficiency      Allergies  Allergen Reactions  . Oxycodone     "Found Oxycodone not effective for pain control" with last RTKA  . Other     Opiods,causes severe anxiety    Current Outpatient Medications on File Prior to Visit  Medication Sig  . ARMOUR THYROID 90 MG tablet Take 0.5 tablets (45 mg total) by mouth daily.  . Cholecalciferol (VITAMIN D3) 5000 units CAPS Take by mouth.  . DULoxetine (CYMBALTA) 60 MG capsule Take 1 capsule (60 mg total) by mouth daily.  . Methylcobalamin (B-12 FAST DISSOLVE SL) Place under the tongue daily.  . Multiple Vitamin (MULTIVITAMIN) tablet Take 1 tablet by mouth daily.  . raloxifene (EVISTA) 60 MG tablet Take 1 tablet Daily for Osteoporosis   Current Facility-Administered Medications on File Prior to Visit  Medication  . 0.9 %  sodium chloride infusion    ROS: Review of Systems  Constitutional: Negative for chills, diaphoresis, fever, malaise/fatigue and weight loss.  HENT: Negative for congestion, ear discharge, ear pain, hearing loss, nosebleeds, sinus pain, sore throat and tinnitus.   Eyes: Negative for blurred vision, double vision, photophobia, pain, discharge and redness.  Respiratory: Negative for cough, hemoptysis, sputum production, shortness of breath, wheezing and stridor.   Cardiovascular: Negative for chest pain, palpitations, orthopnea, claudication, leg swelling and PND.  Gastrointestinal: Negative for  abdominal pain, blood in stool, constipation, diarrhea, heartburn, melena, nausea and vomiting.  Genitourinary: Negative for dysuria, flank pain, frequency, hematuria and urgency.  Musculoskeletal: Negative for back pain, falls, joint pain, myalgias and neck pain.  Skin: Positive for itching.  Neurological: Negative for dizziness, tingling, tremors, sensory change, speech  change, focal weakness, seizures, loss of consciousness, weakness and headaches.  Endo/Heme/Allergies: Negative for environmental allergies and polydipsia. Does not bruise/bleed easily.  Psychiatric/Behavioral: Negative for depression, hallucinations, memory loss, substance abuse and suicidal ideas. The patient is not nervous/anxious and does not have insomnia.      Physical Exam:  BP 130/72   Pulse 64   Temp (!) 97.5 F (36.4 C)   Wt 175 lb (79.4 kg)   SpO2 98%   BMI 34.70 kg/m   General Appearance: Well nourished, in no apparent distress. Eyes: PERRLA, EOMs, conjunctiva no swelling or erythema ENT:  Hearing normal.  Neck: Supple, thyroid normal.  Respiratory: Respiratory effort normal, BS equal bilaterally without rales, rhonchi, wheezing or stridor.  Cardio: RRR with no MRGs. Brisk peripheral pulses without edema.  Lymphatics: Non tender without lymphadenopathy.  Musculoskeletal: Full ROM, 5/5 strength, normal gait.  Skin: Warm, dry without rashes, lesions, ecchymosis. Excoriation to right shoulder from scratching.  No rash or evidence of today. Neuro: Cranial nerves intact. Normal muscle tone, no cerebellar symptoms. Sensation intact.  Psych: Awake and oriented X 3, normal affect, Insight and Judgment appropriate.     Garnet Sierras, NP 11:11 AM [facility]

## 2019-06-19 ENCOUNTER — Other Ambulatory Visit: Payer: Self-pay

## 2019-06-19 ENCOUNTER — Ambulatory Visit (INDEPENDENT_AMBULATORY_CARE_PROVIDER_SITE_OTHER): Payer: Medicare Other | Admitting: Adult Health Nurse Practitioner

## 2019-06-19 ENCOUNTER — Encounter: Payer: Self-pay | Admitting: Adult Health Nurse Practitioner

## 2019-06-19 VITALS — BP 130/72 | HR 64 | Temp 97.5°F | Wt 175.0 lb

## 2019-06-19 DIAGNOSIS — L309 Dermatitis, unspecified: Secondary | ICD-10-CM | POA: Diagnosis not present

## 2019-06-19 MED ORDER — PREDNISONE 20 MG PO TABS: ORAL_TABLET | ORAL | 0 refills | Status: AC

## 2019-06-19 NOTE — Patient Instructions (Addendum)
Please monitor your symptoms and please reach out via MyChart if you have more burning or sharp pain type symptoms.   For dry skin try Eucerin for your shoulder and back.  Use a thick emollient lotion.     We are going to prescribe prednisone for you to take.  Be sure to take this with food.     Shingles  Shingles is an infection. It gives you a painful skin rash and blisters that have fluid in them. Shingles is caused by the same germ (virus) that causes chickenpox. Shingles only happens in people who:  Have had chickenpox.  Have been given a shot of medicine (vaccine) to protect against chickenpox. Shingles is rare in this group. The first symptoms of shingles may be itching, tingling, or pain in an area on your skin. A rash will show on your skin a few days or weeks later. The rash is likely to be on one side of your body. The rash usually has a shape like a belt or a band. Over time, the rash turns into fluid-filled blisters. The blisters will break open, change into scabs, and dry up. Medicines may:  Help with pain and itching.  Help you get better sooner.  Help to prevent long-term problems. Follow these instructions at home: Medicines  Take over-the-counter and prescription medicines only as told by your doctor.  Put on an anti-itch cream or numbing cream where you have a rash, blisters, or scabs. Do this as told by your doctor. Helping with itching and discomfort   Put cold, wet cloths (cold compresses) on the area of the rash or blisters as told by your doctor.  Cool baths can help you feel better. Try adding baking soda or dry oatmeal to the water to lessen itching. Do not bathe in hot water. Blister and rash care  Keep your rash covered with a loose bandage (dressing).  Wear loose clothing that does not rub on your rash.  Keep your rash and blisters clean. To do this, wash the area with mild soap and cool water as told by your doctor.  Check your rash every  day for signs of infection. Check for: ? More redness, swelling, or pain. ? Fluid or blood. ? Warmth. ? Pus or a bad smell.  Do not scratch your rash. Do not pick at your blisters. To help you to not scratch: ? Keep your fingernails clean and cut short. ? Wear gloves or mittens when you sleep, if scratching is a problem. General instructions  Rest as told by your doctor.  Keep all follow-up visits as told by your doctor. This is important.  Wash your hands often with soap and water. If soap and water are not available, use hand sanitizer. Doing this lowers your chance of getting a skin infection caused by germs (bacteria).  Your infection can cause chickenpox in people who have never had chickenpox or never got a shot of chickenpox vaccine. If you have blisters that did not change into scabs yet, try not to touch other people or be around other people, especially: ? Babies. ? Pregnant women. ? Children who have areas of red, itchy, or rough skin (eczema). ? Very old people who have transplants. ? People who have a long-term (chronic) sickness, like cancer or AIDS. Contact a doctor if:  Your pain does not get better with medicine.  Your pain does not get better after the rash heals.  You have any signs of infection in the rash  area. These signs include: ? More redness, swelling, or pain around the rash. ? Fluid or blood coming from the rash. ? The rash area feeling warm to the touch. ? Pus or a bad smell coming from the rash. Get help right away if:  The rash is on your face or nose.  You have pain in your face or pain by your eye.  You lose feeling on one side of your face.  You have trouble seeing.  You have ear pain, or you have ringing in your ear.  You have a loss of taste.  Your condition gets worse. Summary  Shingles gives you a painful skin rash and blisters that have fluid in them.  Shingles is an infection. It is caused by the same germ (virus) that  causes chickenpox.  Keep your rash covered with a loose bandage (dressing). Wear loose clothing that does not rub on your rash.  If you have blisters that did not change into scabs yet, try not to touch other people or be around people. This information is not intended to replace advice given to you by your health care provider. Make sure you discuss any questions you have with your health care provider. Document Released: 01/05/2008 Document Revised: 11/10/2018 Document Reviewed: 03/23/2017 Elsevier Patient Education  2020 [employer].

## 2019-07-09 ENCOUNTER — Ambulatory Visit (INDEPENDENT_AMBULATORY_CARE_PROVIDER_SITE_OTHER): Payer: Medicare Other | Admitting: Internal Medicine

## 2019-07-09 ENCOUNTER — Encounter: Payer: Self-pay | Admitting: Internal Medicine

## 2019-07-09 ENCOUNTER — Other Ambulatory Visit: Payer: Self-pay

## 2019-07-09 VITALS — BP 142/90 | HR 84 | Temp 97.6°F | Resp 16 | Ht 59.5 in | Wt 174.8 lb

## 2019-07-09 DIAGNOSIS — E039 Hypothyroidism, unspecified: Secondary | ICD-10-CM

## 2019-07-09 DIAGNOSIS — Z0001 Encounter for general adult medical examination with abnormal findings: Secondary | ICD-10-CM

## 2019-07-09 DIAGNOSIS — Z136 Encounter for screening for cardiovascular disorders: Secondary | ICD-10-CM

## 2019-07-09 DIAGNOSIS — E782 Mixed hyperlipidemia: Secondary | ICD-10-CM

## 2019-07-09 DIAGNOSIS — Z8249 Family history of ischemic heart disease and other diseases of the circulatory system: Secondary | ICD-10-CM | POA: Diagnosis not present

## 2019-07-09 DIAGNOSIS — Z23 Encounter for immunization: Secondary | ICD-10-CM

## 2019-07-09 DIAGNOSIS — E559 Vitamin D deficiency, unspecified: Secondary | ICD-10-CM

## 2019-07-09 DIAGNOSIS — Z79899 Other long term (current) drug therapy: Secondary | ICD-10-CM

## 2019-07-09 DIAGNOSIS — R0989 Other specified symptoms and signs involving the circulatory and respiratory systems: Secondary | ICD-10-CM | POA: Diagnosis not present

## 2019-07-09 DIAGNOSIS — R7309 Other abnormal glucose: Secondary | ICD-10-CM

## 2019-07-09 DIAGNOSIS — I10 Essential (primary) hypertension: Secondary | ICD-10-CM | POA: Diagnosis not present

## 2019-07-09 NOTE — Progress Notes (Signed)
Annual Screening/Preventative Visit & Comprehensive Evaluation &  Examination     This very nice 68 y.o. MWF  presents for a Screening /Preventative Visit & comprehensive evaluation and management of multiple medical co-morbidities.  Patient has been followed for labile HTN, HLD, Prediabetes and Vitamin D Deficiency.      Labile HTN has been followed expectantly since 2009.  Patient's BP has been controlled at home and patient denies any cardiac symptoms as chest pain, palpitations, shortness of breath, dizziness or ankle swelling. Today's BP is borderline elevated at 142/90.      Patient has hx/o Hypothyroidism predating from 2012 and then Hashimoto's Thyroiditis in 2017 and followed by Dr Cruzita Lederer. Patient continues to c/o fatigue, constipation  & hair thinning despite being on replacement therapy. She questions whether she needs to remain on therapy. In 2012 , when initially started on thyroid replacement, her TSH was very minimally elevated and she was initially started on Levothyroxine 25 mcg.       Patient's hyperlipidemia is controlled with diet and as she has a very high HDL.  Last lipids were at goal:  Lab Results  Component Value Date   CHOL 190 10/04/2018   HDL 81 10/04/2018   LDLCALC 95 10/04/2018   TRIG 62 10/04/2018   CHOLHDL 2.3 10/04/2018       Patient has Moderate Obesity (BMI 34.70) and  hx/o prediabetes predating (A1c - 5.9%  / 2013 and A1c - 6.0% / 2015)  and patient denies reactive hypoglycemic symptoms, visual blurring, diabetic polys or paresthesias. Last A1c was Normal & at goal:  Lab Results  Component Value Date   HGBA1C 5.5 03/14/2018      Finally, patient has history of Vitamin D Deficiency ("33" / 2009) and last Vitamin D was at goal:  Lab Results  Component Value Date   VD25OH 71 03/14/2018   Current Outpatient Medications on File Prior to Visit  Medication Sig  . ARMOUR THYROID 90 MG tablet Take 0.5 tablets (45 mg total) by mouth daily.  Marland Kitchen BIOTIN PO  Take 1 tablet by mouth daily.  . Cholecalciferol (VITAMIN D3) 5000 units CAPS Take by mouth.  . DULoxetine (CYMBALTA) 60 MG capsule Take 1 capsule (60 mg total) by mouth daily.  . Methylcobalamin (B-12 FAST DISSOLVE SL) Place under the tongue daily.  . Multiple Vitamin (MULTIVITAMIN) tablet Take 1 tablet by mouth daily.  . raloxifene (EVISTA) 60 MG tablet Take 1 tablet Daily for Osteoporosis   No current facility-administered medications on file prior to visit.     Allergies  Allergen Reactions  . Oxycodone     "Found Oxycodone not effective for pain control" with last RTKA  . Other     Opiods,causes severe anxiety    Past Medical History:  Diagnosis Date  . Anxiety    occasional  . Arthritis   . Depression   . Hyperlipidemia   . Hypertension   . Hypothyroid    hypo thyroid  . Osteopenia   . Urinary tract infection 12/15/2011   once  . Vitamin D deficiency    Health Maintenance  Topic Date Due  . PNA vac Low Risk Adult (2 of 2 - PCV13) 03/15/2019  . MAMMOGRAM  07/21/2020  . TETANUS/TDAP  03/14/2028  . COLONOSCOPY  01/02/2029  . INFLUENZA VACCINE  Completed  . DEXA SCAN  Completed  . Hepatitis C Screening  Completed   Immunization History  Administered Date(s) Administered  . Influenza Split 05/10/2014, 05/19/2015  . Influenza,  High Dose Seasonal PF 07/02/2018  . Pneumococcal Polysaccharide-23 03/14/2018  . Td 06/02/2006  . Tdap 03/14/2018   Last Colon - 01/03/2019 - Dr Henrene Pastor - Recc 10 yr f/u - due June 2030  Last MGM - 07/21/2018 - & scheduled 08/02/2019   Past Surgical History:  Procedure Laterality Date  . c sections  jan 1980 and march 1984  . CATARACT EXTRACTION, BILATERAL Bilateral 2016   Dr. Bing Plume  . COLONOSCOPY    . ovarian cyst with lararoscopy march 1998  1998  . TOTAL KNEE ARTHROPLASTY  01/03/2012   Procedure: TOTAL KNEE ARTHROPLASTY;  Surgeon: Gearlean Alf, MD;  Location: [facility];  Service: Orthopedics;  Laterality: Right;  . TOTAL KNEE  ARTHROPLASTY Left 09/11/2012   Procedure: Left Total Knee Arthroplasty;  Surgeon: Gearlean Alf, MD;  Location: [facility];  Service: Orthopedics;  Laterality: Left;  . WISDOM TOOTH EXTRACTION  09-05-12   extractions   Family History  Problem Relation Age of Onset  . Hypertension Mother   . Thyroid disease Mother   . Arthritis Mother   . Cancer Mother        gallbladder cancer  . COPD Father   . Breast cancer Maternal Grandmother        36s  . Stroke Maternal Grandmother 74  . Breast cancer Paternal Grandmother   . Colon cancer Neg Hx   . Esophageal cancer Neg Hx   . Stomach cancer Neg Hx   . Rectal cancer Neg Hx    Social History   Tobacco Use  . Smoking status: Never Smoker  . Smokeless tobacco: Never Used  Substance Use Topics  . Alcohol use: Yes    Alcohol/week: 12.0 standard drinks    Types: 12 Glasses of wine per week    Comment: 1-2 glass wine night  . Drug use: No    ROS Constitutional: Denies fever, chills, weight loss/gain, headaches, insomnia,  night sweats, and change in appetite. Does have long standing  c/o fatigue. Eyes: Denies redness, blurred vision, diplopia, discharge, itchy, watery eyes.  ENT: Denies discharge, congestion, post nasal drip, epistaxis, sore throat, earache, hearing loss, dental pain, Tinnitus, Vertigo, Sinus pain, snoring.  Cardio: Denies chest pain, palpitations, irregular heartbeat, syncope, dyspnea, diaphoresis, orthopnea, PND, claudication, edema Respiratory: denies cough, dyspnea, DOE, pleurisy, hoarseness, laryngitis, wheezing.  Gastrointestinal: Denies dysphagia, heartburn, reflux, water brash, pain, cramps, nausea, vomiting, bloating, diarrhea,  hematemesis, melena, hematochezia, jaundice, hemorrhoids. (+) constipation. Genitourinary: Denies dysuria, frequency, urgency, nocturia, hesitancy, discharge, hematuria, flank pain Breast: Breast lumps, nipple discharge, bleeding.  Musculoskeletal: Denies arthralgia, myalgia, stiffness, Jt.  Swelling, pain, limp, and strain/sprain. Denies falls. Skin: Denies puritis, rash, hives, warts, acne, eczema, changing in skin lesion Neuro: No weakness, tremor, incoordination, spasms, paresthesia, pain Psychiatric: Denies confusion, memory loss, sensory loss. Denies Depression. Endocrine: Denies change in weight, skin, hair change, nocturia, and paresthesia, diabetic polys, visual blurring, hyper / hypo glycemic episodes.  Heme/Lymph: No excessive bleeding, bruising, enlarged lymph nodes.  Physical Exam  BP (!) 142/90   Pulse 84   Temp 97.6 F (36.4 C)   Resp 16   Ht 4' 11.5" (1.511 m)   Wt 174 lb 12.8 oz (79.3 kg)   BMI 34.71 kg/m   General Appearance: Over nourished, well groomed and in no apparent distress.  Eyes: PERRLA, EOMs, conjunctiva no swelling or erythema, normal fundi and vessels. Sinuses: No frontal/maxillary tenderness ENT/Mouth: EACs patent / TMs  nl. Nares clear without erythema, swelling, mucoid exudates. Oral hygiene  is good. No erythema, swelling, or exudate. Tongue normal, non-obstructing. Tonsils not swollen or erythematous. Hearing normal.  Neck: Supple, thyroid not palpable. No bruits, nodes or JVD. Respiratory: Respiratory effort normal.  BS equal and clear bilateral without rales, rhonci, wheezing or stridor. Cardio: Heart sounds are normal with regular rate and rhythm and no murmurs, rubs or gallops. Peripheral pulses are normal and equal bilaterally without edema. No aortic or femoral bruits. Chest: symmetric with normal excursions and percussion. Breasts: Symmetric, without lumps, nipple discharge, retractions, or fibrocystic changes.  Abdomen: Flat, soft with bowel sounds active. Nontender, no guarding, rebound, hernias, masses, or organomegaly.  Lymphatics: Non tender without lymphadenopathy.  Musculoskeletal: Full ROM all peripheral extremities, joint stability, 5/5 strength, and normal gait. Skin: Warm and dry without rashes, lesions, cyanosis,  clubbing or  ecchymosis.  Neuro: Cranial nerves intact, reflexes equal bilaterally. Normal muscle tone, no cerebellar symptoms. Sensation intact.  Pysch: Alert and oriented X 3, normal affect, Insight and Judgment appropriate.   Assessment and Plan  1. Encounter for routine adult health examination with abnormal findings  2. Labile hypertension  - EKG 12-Lead - Urinalysis, Routine w reflex microscopic - Microalbumin / Creatinine Urine Ratio - CBC with Diff - COMPLETE METABOLIC PANEL WITH GFR - Magnesium  3. Hyperlipidemia, mixed  - EKG 12-Lead - Lipid Profile  4. Abnormal glucose  - EKG 12-Lead - Hemoglobin A1c ([facility]) - Insulin, random  5. Vitamin D deficiency  - Vitamin D (25 hydroxy)  6. Hypothyroidism  - Discussed with patient leaving off her Armor Thyroid til her up coming visit with Dr Cruzita Lederer in about 6 weeks to allow Dr Cruzita Lederer to re-establish a baseline as to whether patient needs to remain on thyroid meds.   7. Screening for ischemic heart disease  - EKG 12-Lead  8. FH: hypertension  - EKG 12-Lead  9. Medication management  - Urinalysis, Routine w reflex microscopic - Microalbumin / Creatinine Urine Ratio - CBC with Diff - COMPLETE METABOLIC PANEL WITH GFR - Magnesium - Lipid Profile - Hemoglobin A1c ([facility]) - Insulin, random - Vitamin D (25 hydroxy)  10. Need for prophylactic vaccination against Streptococcus pneumoniae (pneumococcus)  - Pneumococcal conjugate vaccine 13-valent          Patient was counseled in prudent diet to achieve/maintain BMI less than 25 for weight control, BP monitoring, regular exercise and medications. Discussed med's effects and SE's. Screening labs and tests as requested with regular follow-up as recommended. Over 40 minutes of exam, counseling, chart review and high complex critical decision making was performed.   Kirtland Bouchard, MD

## 2019-07-09 NOTE — Patient Instructions (Signed)

## 2019-07-11 LAB — CBC WITH DIFFERENTIAL/PLATELET
Absolute Monocytes: 902 cells/uL (ref 200–950)
Basophils Absolute: 90 cells/uL (ref 0–200)
Basophils Relative: 1.1 %
Eosinophils Absolute: 279 cells/uL (ref 15–500)
Eosinophils Relative: 3.4 %
HCT: 40 % (ref 35.0–45.0)
Hemoglobin: 13.6 g/dL (ref 11.7–15.5)
Lymphs Abs: 2239 cells/uL (ref 850–3900)
MCH: 31.6 pg (ref 27.0–33.0)
MCHC: 34 g/dL (ref 32.0–36.0)
MCV: 92.8 fL (ref 80.0–100.0)
MPV: 9.7 fL (ref 7.5–12.5)
Monocytes Relative: 11 %
Neutro Abs: 4690 cells/uL (ref 1500–7800)
Neutrophils Relative %: 57.2 %
Platelets: 302 10*3/uL (ref 140–400)
RBC: 4.31 10*6/uL (ref 3.80–5.10)
RDW: 12.3 % (ref 11.0–15.0)
Total Lymphocyte: 27.3 %
WBC: 8.2 10*3/uL (ref 3.8–10.8)

## 2019-07-11 LAB — LIPID PANEL
Cholesterol: 191 mg/dL (ref <200)
HDL: 85 mg/dL (ref >=50)
LDL Cholesterol (Calc): 89 mg/dL (calc)
Non-HDL Cholesterol (Calc): 106 mg/dL (calc) (ref <130)
Total CHOL/HDL Ratio: 2.2 (calc) (ref <5.0)
Triglycerides: 84 mg/dL (ref <150)

## 2019-07-11 LAB — MICROALBUMIN / CREATININE URINE RATIO
Creatinine, Urine: 68 mg/dL (ref 20–275)
Microalb Creat Ratio: 3 mcg/mg creat (ref <30)
Microalb, Ur: 0.2 mg/dL

## 2019-07-11 LAB — URINALYSIS, ROUTINE W REFLEX MICROSCOPIC
Bilirubin Urine: NEGATIVE
Glucose, UA: NEGATIVE
Hgb urine dipstick: NEGATIVE
Ketones, ur: NEGATIVE
Leukocytes,Ua: NEGATIVE
Nitrite: NEGATIVE
Protein, ur: NEGATIVE
Specific Gravity, Urine: 1.013 (ref 1.001–1.03)
pH: <=5 (ref 5.0–8.0)

## 2019-07-11 LAB — COMPLETE METABOLIC PANEL WITH GFR
AG Ratio: 1.5 (calc) (ref 1.0–2.5)
ALT: 21 U/L (ref 6–29)
AST: 23 U/L (ref 10–35)
Albumin: 4.1 g/dL (ref 3.6–5.1)
Alkaline phosphatase (APISO): 82 U/L (ref 37–153)
BUN: 13 mg/dL (ref 7–25)
CO2: 22 mmol/L (ref 20–32)
Calcium: 9 mg/dL (ref 8.6–10.4)
Chloride: 105 mmol/L (ref 98–110)
Creat: 0.9 mg/dL (ref 0.50–0.99)
GFR, Est African American: 77 mL/min/{1.73_m2} (ref >=60)
GFR, Est Non African American: 67 mL/min/{1.73_m2} (ref >=60)
Globulin: 2.7 g/dL (calc) (ref 1.9–3.7)
Glucose, Bld: 96 mg/dL (ref 65–99)
Potassium: 4.1 mmol/L (ref 3.5–5.3)
Sodium: 140 mmol/L (ref 135–146)
Total Bilirubin: 0.5 mg/dL (ref 0.2–1.2)
Total Protein: 6.8 g/dL (ref 6.1–8.1)

## 2019-07-11 LAB — VITAMIN D 25 HYDROXY (VIT D DEFICIENCY, FRACTURES): Vit D, 25-Hydroxy: 48 ng/mL (ref 30–100)

## 2019-07-11 LAB — INSULIN, RANDOM: Insulin: 6.7 u[IU]/mL

## 2019-07-11 LAB — HEMOGLOBIN A1C
Hgb A1c MFr Bld: 5.7 % of total Hgb — ABNORMAL HIGH (ref <5.7)
Mean Plasma Glucose: 117 (calc)
eAG (mmol/L): 6.5 (calc)

## 2019-07-11 LAB — MAGNESIUM: Magnesium: 2 mg/dL (ref 1.5–2.5)

## 2019-07-24 ENCOUNTER — Other Ambulatory Visit: Payer: Self-pay | Admitting: Internal Medicine

## 2019-07-24 DIAGNOSIS — Z1231 Encounter for screening mammogram for malignant neoplasm of breast: Secondary | ICD-10-CM

## 2019-08-02 ENCOUNTER — Other Ambulatory Visit: Payer: Self-pay

## 2019-08-02 ENCOUNTER — Ambulatory Visit
Admission: RE | Admit: 2019-08-02 | Discharge: 2019-08-02 | Disposition: A | Discharge status: Home / Self Care | Payer: Medicare Other | Source: Ambulatory Visit

## 2019-08-02 DIAGNOSIS — Z1231 Encounter for screening mammogram for malignant neoplasm of breast: Secondary | ICD-10-CM

## 2019-08-23 ENCOUNTER — Ambulatory Visit (INDEPENDENT_AMBULATORY_CARE_PROVIDER_SITE_OTHER): Payer: Medicare Other | Admitting: Internal Medicine

## 2019-08-23 ENCOUNTER — Encounter: Payer: Self-pay | Admitting: Internal Medicine

## 2019-08-23 ENCOUNTER — Other Ambulatory Visit: Payer: Self-pay

## 2019-08-23 VITALS — BP 150/100 | HR 90 | Ht 59.5 in | Wt 178.0 lb

## 2019-08-23 DIAGNOSIS — E063 Autoimmune thyroiditis: Secondary | ICD-10-CM | POA: Diagnosis not present

## 2019-08-23 DIAGNOSIS — K148 Other diseases of tongue: Secondary | ICD-10-CM

## 2019-08-23 DIAGNOSIS — E038 Other specified hypothyroidism: Secondary | ICD-10-CM | POA: Diagnosis not present

## 2019-08-23 NOTE — Patient Instructions (Signed)
Please stop at the lab.  Please continue Armour 45 mg daily.  Take the thyroid hormone every day, with water, at least 30 minutes before breakfast, separated by at least 4 hours from: - acid reflux medications - calcium - iron - multivitamins  Please come back for a follow-up appointment in 6 months.

## 2019-08-23 NOTE — Progress Notes (Signed)
Patient ID: Michelle Miranda, female   DOB: Apr 17, 1951, 69 y.o.   MRN: [id number]   This visit occurred during the SARS-CoV-2 public health emergency.  Safety protocols were in place, including screening questions prior to the visit, additional usage of staff PPE, and extensive cleaning of exam room while observing appropriate contact time as indicated for disinfecting solutions.   HPI  Michelle Miranda is a 69 y.o.-year-old female, initially referred by her PCP, Michelle Mutters, NP (Dr. Melford Aase), returning for follow-up for Hashimoto's hypothyroidism. Last visit 1 year ago.  At last visit, she had multiple complaints including hair loss, dry skin, weight gain, heat intolerance/increased sweating, and itching, enlarged tongue, significant fatigue.  At that time, we changed from levothyroxine to Armour.  She is feeling better on this, but still has some fatigue (sleeping 11 hours a day but does not have to nap during the day), increased sweating.  She again complains about an enlarged tongue.  Reviewed and addended history: Pt. has been dx with hypothyroidism in 2012 and Hashimoto's thyroiditis in 04/2016 >> prev. On Levothyroxine, then on Synthroid d.a.w. 37.5 mcg 6/7 and 75 mcg 1/7 (changed in 01/2016).  Latest thyroid dose change was to 50 mcg daily and she has maintained normal TFTs afterwards.    At last visit, we changed from levothyroxine to Armour.  She is taking Armour 45 mg daily: -In a.m. (4:30-5 am), then goes back to bed -Fasting -Coffee and creamer 1 hour later -At least 30 minutes from breakfast -No Ca, Fe, PPIs -+ Multivitamins more than 4 hours later, ~noon  -Biotin 5000 mcg daily - off x 2 weeks  She was previously on selenium 200 mcg daily, now off.  Reviewed patient's TFTs: Lab Results  Component Value Date   TSH 2.50 06/13/2019   TSH 2.77 02/26/2019   TSH 2.33 10/04/2018   TSH 1.32 02/10/2018   TSH 1.62 02/10/2017   TSH 2.08 07/06/2016   TSH 2.02  05/18/2016   TSH 1.73 04/09/2016   TSH 5.15 (H) 02/23/2016   TSH 1.811 05/19/2015   FREET4 0.61 06/13/2019   FREET4 0.76 02/26/2019   FREET4 0.90 02/10/2018   FREET4 0.74 02/10/2017   FREET4 0.70 05/18/2016   FREET4 0.79 04/09/2016    She has a history of Hashimoto's thyroiditis based on elevated TPO antibodies Component     Latest Ref Rng & Units 04/09/2016  Thyroperoxidase Ab SerPl-aCnc     <9 IU/mL 9 (H)  Thyroglobulin Ab     <2 IU/mL <1   Pt denies: - feeling nodules in neck - hoarseness - dysphagia - SOB with lying down But she has been having occasional choking for many years.  She has + FH of thyroid disorders in: mother - hypothyroid. No FH of thyroid cancer. No h/o radiation tx to head or neck.  No herbal supplements. No Biotin use. No recent steroids use.   ROS: Constitutional: no weight gain/no weight loss, + fatigue, + subjective hyperthermia, no subjective hypothermia Eyes: no blurry vision, no xerophthalmia ENT: no sore throat, + see HPI Cardiovascular: no CP/no SOB/no palpitations/no leg swelling Respiratory: no cough/no SOB/no wheezing Gastrointestinal: no N/no V/no D/no C/no acid reflux Musculoskeletal: no muscle aches/no joint aches Skin: + itching, no hair loss Neurological: no tremors/no numbness/no tingling/no dizziness  I reviewed pt's medications, allergies, PMH, social hx, family hx, and changes were documented in the history of present illness. Otherwise, unchanged from my initial visit note.  Past Medical History:  Diagnosis Date  .  Anxiety    occasional  . Arthritis   . Depression   . Hyperlipidemia   . Hypertension   . Hypothyroid    hypo thyroid  . Osteopenia   . Urinary tract infection 12/15/2011   once  . Vitamin D deficiency    Past Surgical History:  Procedure Laterality Date  . c sections  jan 1980 and march 1984  . CATARACT EXTRACTION, BILATERAL Bilateral 2016   Dr. Bing Plume  . COLONOSCOPY    . ovarian cyst with lararoscopy  march 1998  1998  . TOTAL KNEE ARTHROPLASTY  01/03/2012   Procedure: TOTAL KNEE ARTHROPLASTY;  Surgeon: Gearlean Alf, MD;  Location: [facility];  Service: Orthopedics;  Laterality: Right;  . TOTAL KNEE ARTHROPLASTY Left 09/11/2012   Procedure: Left Total Knee Arthroplasty;  Surgeon: Gearlean Alf, MD;  Location: [facility];  Service: Orthopedics;  Laterality: Left;  . WISDOM TOOTH EXTRACTION  09-05-12   extractions   Social History   Social History  . Marital status: Married    Spouse name: N/A  . Number of children: 2   Occupational History  . Law office admin   Social History Main Topics  . Smoking status: Never Smoker  . Smokeless tobacco: Never Used  . Alcohol use Yes     Comment: 1-2 glass wine night  . Drug use: No   Current Outpatient Medications on File Prior to Visit  Medication Sig Dispense Refill  . ARMOUR THYROID 90 MG tablet Take 0.5 tablets (45 mg total) by mouth daily. 60 tablet 2  . BIOTIN PO Take 1 tablet by mouth daily.    . Cholecalciferol (VITAMIN D3) 5000 units CAPS Take by mouth.    . DULoxetine (CYMBALTA) 60 MG capsule Take 1 capsule (60 mg total) by mouth daily. 90 capsule 1  . Methylcobalamin (B-12 FAST DISSOLVE SL) Place under the tongue daily.    . Multiple Vitamin (MULTIVITAMIN) tablet Take 1 tablet by mouth daily.    . raloxifene (EVISTA) 60 MG tablet Take 1 tablet Daily for Osteoporosis 90 tablet 3   No current facility-administered medications on file prior to visit.   Allergies  Allergen Reactions  . Oxycodone     "Found Oxycodone not effective for pain control" with last RTKA  . Other     Opiods,causes severe anxiety   Family History  Problem Relation Age of Onset  . Hypertension Mother   . Thyroid disease Mother   . Arthritis Mother   . Cancer Mother        gallbladder cancer  . COPD Father   . Breast cancer Maternal Grandmother        16s  . Stroke Maternal Grandmother 6  . Breast cancer Paternal Grandmother   . Colon cancer Neg Hx    . Esophageal cancer Neg Hx   . Stomach cancer Neg Hx   . Rectal cancer Neg Hx    PE: BP (!) 150/100   Pulse 90   Ht 4' 11.5" (1.511 m)   Wt 178 lb (80.7 kg)   SpO2 97%   BMI 35.35 kg/m  Wt Readings from Last 3 Encounters:  08/23/19 178 lb (80.7 kg)  07/09/19 174 lb 12.8 oz (79.3 kg)  06/19/19 175 lb (79.4 kg)   Constitutional: overweight, in NAD Eyes: PERRLA, EOMI, no exophthalmos ENT: moist mucous membranes, no thyromegaly, no cervical lymphadenopathy Cardiovascular: RRR, No MRG Respiratory: CTA B Gastrointestinal: abdomen soft, NT, ND, BS+ Musculoskeletal: no deformities, strength intact in all  4 Skin: moist, warm, no rashes Neurological: + Chronic tremor with outstretched hands, DTR normal in all 4  ASSESSMENT: 1. Hypothyroidism 2/2 Hashimoto's thyroiditis  2. Enlarged tongue  PLAN:  1. Patient with longstanding Hashimoto's hypothyroidism, on levothyroxine, now on generic due to price - latest thyroid labs reviewed with pt >> normal, but she was on Biotin then: Lab Results  Component Value Date   TSH 2.50 06/13/2019   - she continues on Armour 45 mg daily, changed from levothyroxine at last visit.  At that time, she had several complaints including fatigue.  Right after we started Armour, she misunderstood instructions and started on 90 mg daily.  She did feel good on this dose, with more energy.  However, she realized her mistake after 1 month and she is now on 45 mg daily - pt feels better after switching to Armour, not as tired - we discussed about taking the thyroid hormone every day, with water, >30 minutes before breakfast, separated by >4 hours from acid reflux medications, calcium, iron, multivitamins. Pt. is taking it correctly. - will check thyroid tests today: TSH, free T3 and fT4 (she has been off biotin for 2 weeks) - If labs are abnormal, she will need to return for repeat TFTs in 1.5 months - OTW, I will see her back in a year.  I reminded her that she  needs to be off biotin for at least 10 days before labs.  2. Enlarged tongue -She has occasional choking, which is chronic -At this visit, we discussed that this can be related to uncontrolled hypothyroidism (which she does not have), acromegaly (she denies increase in size of her fingers and toes, enlarged jaw -but we will check an IGF-I today), amyloidosis (per PCP)  Component     Latest Ref Rng & Units 08/23/2019  IGF-I, LC/MS     41 - 279 ng/mL 83  Z-Score (Female)     -2.0 - 2 SD -0.7  TSH     0.35 - 4.50 uIU/mL 3.29  T4,Free(Direct)     0.60 - 1.60 ng/dL 0.70  Triiodothyronine,Free,Serum     2.3 - 4.2 pg/mL 2.6  Labs are normal.  Philemon Kingdom, MD PhD [facility]

## 2019-08-24 LAB — T4, FREE: Free T4: 0.7 ng/dL (ref 0.60–1.60)

## 2019-08-24 LAB — TSH: TSH: 3.29 u[IU]/mL (ref 0.35–4.50)

## 2019-08-24 LAB — T3, FREE: T3, Free: 2.6 pg/mL (ref 2.3–4.2)

## 2019-08-27 LAB — INSULIN-LIKE GROWTH FACTOR
IGF-I, LC/MS: 83 ng/mL (ref 41–279)
Z-Score (Female): -0.7 SD (ref ?–2.0)

## 2019-08-29 ENCOUNTER — Ambulatory Visit: Payer: Medicare Other | Attending: Internal Medicine

## 2019-08-29 DIAGNOSIS — Z20822 Contact with and (suspected) exposure to covid-19: Secondary | ICD-10-CM | POA: Diagnosis not present

## 2019-08-30 LAB — NOVEL CORONAVIRUS, NAA: SARS-CoV-2, NAA: NOT DETECTED

## 2019-08-31 ENCOUNTER — Other Ambulatory Visit: Payer: Self-pay | Admitting: Internal Medicine

## 2019-08-31 MED ORDER — GABAPENTIN 100 MG PO CAPS: ORAL_CAPSULE | ORAL | 2 refills | Status: DC

## 2019-09-22 ENCOUNTER — Other Ambulatory Visit: Payer: Self-pay | Admitting: Adult Health

## 2019-10-15 NOTE — Progress Notes (Signed)
MEDICARE ANNUAL WELLNESS VISIT AND FOLLOW UP  Assessment:     Encounter for annual medicare wellness  Essential hypertension Borderline above goal; discussed initiating medication today She declines at this time, plans to implement some lifestyle changes DASH diet, exercise and start monitoring at home.  Call if greater than 130/80.  - CBC with Differential/Platelet - CMP/GFR  Other specified hypothyroidism Managed by [name], now on armour thyroid and much improved.  Continue medications the same, reminded to take on an empty stomach 30-60mins before food.  Defer TSH as was just checked by [name]  Hyperlipidemia -continue medications, check lipids, decrease fatty foods, increase activity.  - Lipid panel  Morbid obesity ([place]) - BMI 35 with htn, hyperlipidemia, prediabetes Long discussion about weight loss, diet, and exercise Recommended diet heavy in fruits and veggies and low in animal meats, cheeses, and dairy products, appropriate calorie intake Discussed appropriate weight for height  Follow up at next visit  Depression, major, in remission ([place]) Cymbalta to 60 mg daily  Lifestyle discussed: diet/exerise, sleep hygiene, stress management, hydration   Prediabetes Discussed general issues about diabetes pathophysiology and management., Educational material distributed., Suggested low cholesterol diet., Encouraged aerobic exercise., Discussed foot care., Reminded to get yearly retinal exam. Hemoglobin A1c q10m; monitor weight, serum glucose   Vitamin D deficiency At goal at recent check; continue to recommend supplementation for goal of 70-100 Defer vitamin D level  Osteoarthritis of both knees, unspecified osteoarthritis type Weight loss advised, better after surgery   Osteopenia Continue with evista;  Repeat DEXA 07/2020 continue Vit D and Ca, weight bearing exercises  Medication management CBC, CMP/GFR   Over 40 minutes of exam, counseling, chart  review and critical decision making was performed Future Appointments  Date Time Provider [place]  02/19/2020  2:00 PM Philemon Kingdom, MD [id number] None  08/04/2020  3:00 PM Unk Pinto, MD [facility] None     Plan:   During the course of the visit the patient was educated and counseled about appropriate screening and preventive services including:    Pneumococcal vaccine   Prevnar 13  Influenza vaccine  Td vaccine  Screening electrocardiogram  Bone densitometry screening  Colorectal cancer screening  Diabetes screening  Glaucoma screening  Nutrition counseling   Advanced directives: requested   Subjective:  Michelle Miranda is a 69 y.o. female who presents for Medicare Annual Wellness Visit and 3 month follow up.   First grandchild (girl) arrived last year, doing well, watches her during the day a few days a week.   She reports he mood is doing very well on cymbalta 60 mg daily.  She is on evista  for osteoporosis; + vaginal dryness but albolene helps.   BMI is Body mass index is 35.75 kg/m., she has been working on diet and exercise, walking twice a week with granddaughter in stroller, 45 min.  Wt Readings from Last 3 Encounters:  10/17/19 180 lb (81.6 kg)  08/23/19 178 lb (80.7 kg)  07/09/19 174 lb 12.8 oz (79.3 kg)   She has not been checking BP at home, today their BP is BP: 140/82 She does workout. She denies chest pain, shortness of breath, dizziness.   She is not on cholesterol medication and denies myalgias. Her cholesterol is at goal. The cholesterol last visit was:   Lab Results  Component Value Date   CHOL 191 07/09/2019   HDL 85 07/09/2019   LDLCALC 89 07/09/2019   TRIG 84 07/09/2019   CHOLHDL 2.2 07/09/2019  She has not been working on diet and exercise for glucose management, and denies increased appetite, nausea, paresthesia of the feet, polydipsia, polyuria and visual disturbances. Last A1C in the office was:   Lab Results  Component Value Date   HGBA1C 5.7 (H) 07/09/2019   She is on thyroid medication, now following with [name], was switched to armour thyroid and per patient feeling much improved. Her medication was not changed last visit.   Lab Results  Component Value Date   TSH 3.29 08/23/2019   Last GFR: Lab Results  Component Value Date   GFRNONAA 67 07/09/2019   Patient is on Vitamin D supplement.   Lab Results  Component Value Date   VD25OH 48 07/09/2019      Medication Review: Current Outpatient Medications on File Prior to Visit  Medication Sig Dispense Refill  . ARMOUR THYROID 90 MG tablet Take 0.5 tablets (45 mg total) by mouth daily. 60 tablet 2  . Cholecalciferol (VITAMIN D3) 5000 units CAPS Take by mouth.    . DULoxetine (CYMBALTA) 60 MG capsule Take 1 capsule (60 mg total) by mouth daily. 90 capsule 1  . Methylcobalamin (B-12 FAST DISSOLVE SL) Place under the tongue daily.    . Multiple Vitamin (MULTIVITAMIN) tablet Take 1 tablet by mouth daily.    . raloxifene (EVISTA) 60 MG tablet Take 1 tablet Daily for Osteoporosis 90 tablet 3  . BIOTIN PO Take 1 tablet by mouth daily.     No current facility-administered medications on file prior to visit.    Allergies  Allergen Reactions  . Oxycodone     "Found Oxycodone not effective for pain control" with last RTKA  . Other     Opiods,causes severe anxiety    Current Problems (verified) Patient Active Problem List   Diagnosis Date Noted  . Morbid obesity ([place]) 10/17/2019  . Other abnormal glucose 05/19/2015  . Osteopenia 05/19/2015  . Obesity (BMI 30.0-34.9) 05/10/2014  . Hyperlipidemia   . Hypertension   . Hypothyroidism due to Hashimoto's thyroiditis   . Depression, major, in remission ([place])   . Vitamin D deficiency   . OA (osteoarthritis) of knee 01/03/2012    Screening Tests Immunization History  Administered Date(s) Administered  . Fluad Quad(high Dose 65+) 04/12/2019  . Influenza Split  05/10/2014, 05/19/2015  . Influenza, High Dose Seasonal PF 07/02/2018  . PFIZER SARS-COV-2 Vaccination 09/13/2019, 10/08/2019  . Pneumococcal Conjugate-13 07/09/2019  . Pneumococcal Polysaccharide-23 03/14/2018  . Td 06/02/2006  . Tdap 03/14/2018   Tetanus: 2007, 2019 Pneumovax: 03/2018  Prevnar 13: 07/2019 Flu vaccine: 05/2019 Zostavax: declines Covid 19: 2/2, 2021  Pap: 03/2018, never abnormal, HPV neg, DONE MGM: 07/2019 DEXA: 07/2018, T-2.4 L fem, on evista Colonoscopy: 01/2019 Dr. Henrene Pastor, 10 year follow up EGD: N/A CTA 09/2012  Names of Other Physician/Practitioners you currently use: 1. [facility] here for primary care 2. Vision: Dr. Bing Plume, last visit 2020 3. Dental: Dr. Marquette Saa, last visit 09/2019, goes q56m   [name], [name] [name], MD as Consulting Physician (Orthopedic Surgery) [name], MD as Consulting Physician (Ophthalmology) Irene Shipper, MD as Consulting Physician (Gastroenterology) Philemon Kingdom, MD as Consulting Physician (Internal Medicine) Garnet Sierras, NP as Nurse Practitioner (Adult Health Nurse Practitioner)  SURGICAL HISTORY She  has a past surgical history that includes c sections (jan 1980 and march 1984); ovarian cyst with lararoscopy march 1998 (1998); Total knee arthroplasty (01/03/2012); Wisdom  tooth extraction (09-05-12); Total knee arthroplasty (Left, 09/11/2012); Cataract extraction, bilateral (Bilateral, 2016); and Colonoscopy. FAMILY HISTORY Her family history includes Arthritis in her mother; Breast cancer in her maternal grandmother and paternal grandmother; COPD in her father; Cancer in her mother; Hypertension in her mother; Stroke (age of onset: 54) in her maternal grandmother; Thyroid disease in her mother. SOCIAL HISTORY She  reports that she has never smoked. She has never used smokeless tobacco. She reports current alcohol  use of about 12.0 standard drinks of alcohol per week. She reports that she does not use drugs.   MEDICARE WELLNESS OBJECTIVES: Physical activity: Current Exercise Habits: Home exercise routine, Type of exercise: walking, Time (Minutes): 45, Frequency (Times/Week): 2, Weekly Exercise (Minutes/Week): 90, Intensity: Mild, Exercise limited by: None identified Cardiac risk factors: Cardiac Risk Factors include: advanced age (>31men, >39 women);dyslipidemia;hypertension;obesity (BMI >30kg/m2) Depression/mood screen:   Depression screen [facility] 2/9 10/17/2019  Decreased Interest 0  Down, Depressed, Hopeless 0  PHQ - 2 Score 0  Altered sleeping 0  Tired, decreased energy 0  Change in appetite 0  Feeling bad or failure about yourself  0  Trouble concentrating 0  Moving slowly or fidgety/restless 0  Suicidal thoughts 0  PHQ-9 Score 0  Difficult doing work/chores Not difficult at all    ADLs:  In your present state of health, do you have any difficulty performing the following activities: 10/17/2019  Hearing? N  Vision? N  Difficulty concentrating or making decisions? N  Walking or climbing stairs? N  Dressing or bathing? N  Doing errands, shopping? N  Some recent data might be hidden     Cognitive Testing  Alert? Yes  Normal Appearance?Yes  Oriented to person? Yes  Place? Yes   Time? Yes  Recall of three objects?  Yes  Can perform simple calculations? Yes  Displays appropriate judgment?Yes  Can read the correct time from a watch face?Yes  EOL planning: Does Patient Have a Medical Advance Directive?: Yes Type of Advance Directive: Healthcare Power of Attorney, Living will Does patient want to make changes to medical advance directive?: No - Patient declined Copy of [place] in Chart?: No - copy requested  Review of Systems  Constitutional: Negative for malaise/fatigue and weight loss.  HENT: Negative for hearing loss and tinnitus.   Eyes: Negative for blurred  vision and double vision.  Respiratory: Negative for cough, sputum production, shortness of breath and wheezing.   Cardiovascular: Negative for chest pain, palpitations, orthopnea, claudication, leg swelling and PND.  Gastrointestinal: Negative for abdominal pain, blood in stool, constipation, diarrhea, heartburn, melena, nausea and vomiting.  Genitourinary: Negative.   Musculoskeletal: Negative for falls, joint pain and myalgias.  Skin: Negative for rash.  Neurological: Negative for dizziness, tingling, sensory change, weakness and headaches.  Endo/Heme/Allergies: Negative for polydipsia.  Psychiatric/Behavioral: Negative for depression, memory loss, substance abuse and suicidal ideas. The patient is not nervous/anxious and does not have insomnia.   All other systems reviewed and are negative.    Objective:     Today's Vitals   10/17/19 1513  BP: 140/82  Pulse: 72  Temp: (!) 97.5 F (36.4 C)  SpO2: 98%  Weight: 180 lb (81.6 kg)   Body mass index is 35.75 kg/m.  General appearance: alert, no distress, WD/WN, female HEENT: normocephalic, sclerae anicteric, TMs pearly, nares patent, no discharge or erythema, pharynx normal Oral cavity: MMM, no lesions Neck: supple, no lymphadenopathy, no thyromegaly, no masses Heart: RRR, normal S1, S2, no murmurs Lungs:  CTA bilaterally, no wheezes, rhonchi, or rales Abdomen: +bs, soft, non tender, non distended, no masses, no hepatomegaly, no splenomegaly Musculoskeletal: nontender, no swelling, no obvious deformity Extremities: no edema, no cyanosis, no clubbing Pulses: 2+ symmetric, upper and lower extremities, normal cap refill Neurological: alert, oriented x 3, CN2-12 intact, strength normal upper extremities and lower extremities, sensation normal throughout, DTRs 2+ throughout, no cerebellar signs, gait normal Psychiatric: normal affect, behavior normal, pleasant    Medicare Attestation I have personally reviewed: The patient's  medical and social history Their use of alcohol, tobacco or illicit drugs Their current medications and supplements The patient's functional ability including ADLs,fall risks, home safety risks, cognitive, and hearing and visual impairment Diet and physical activities Evidence for depression or mood disorders  The patient's weight, height, BMI, and visual acuity have been recorded in the chart.  I have made referrals, counseling, and provided education to the patient based on review of the above and I have provided the patient with a written personalized care plan for preventive services.     Izora Ribas, NP   10/17/2019

## 2019-10-17 ENCOUNTER — Ambulatory Visit (INDEPENDENT_AMBULATORY_CARE_PROVIDER_SITE_OTHER): Payer: Medicare Other | Admitting: Adult Health

## 2019-10-17 ENCOUNTER — Other Ambulatory Visit: Payer: Self-pay

## 2019-10-17 ENCOUNTER — Encounter: Payer: Self-pay | Admitting: Adult Health

## 2019-10-17 VITALS — BP 140/82 | HR 72 | Temp 97.5°F | Wt 180.0 lb

## 2019-10-17 DIAGNOSIS — M8580 Other specified disorders of bone density and structure, unspecified site: Secondary | ICD-10-CM

## 2019-10-17 DIAGNOSIS — R7309 Other abnormal glucose: Secondary | ICD-10-CM

## 2019-10-17 DIAGNOSIS — F325 Major depressive disorder, single episode, in full remission: Secondary | ICD-10-CM | POA: Diagnosis not present

## 2019-10-17 DIAGNOSIS — M170 Bilateral primary osteoarthritis of knee: Secondary | ICD-10-CM | POA: Diagnosis not present

## 2019-10-17 DIAGNOSIS — R6889 Other general symptoms and signs: Secondary | ICD-10-CM

## 2019-10-17 DIAGNOSIS — E063 Autoimmune thyroiditis: Secondary | ICD-10-CM

## 2019-10-17 DIAGNOSIS — I10 Essential (primary) hypertension: Secondary | ICD-10-CM

## 2019-10-17 DIAGNOSIS — E2839 Other primary ovarian failure: Secondary | ICD-10-CM

## 2019-10-17 DIAGNOSIS — Z0000 Encounter for general adult medical examination without abnormal findings: Secondary | ICD-10-CM

## 2019-10-17 DIAGNOSIS — Z0001 Encounter for general adult medical examination with abnormal findings: Secondary | ICD-10-CM | POA: Diagnosis not present

## 2019-10-17 DIAGNOSIS — E559 Vitamin D deficiency, unspecified: Secondary | ICD-10-CM | POA: Diagnosis not present

## 2019-10-17 DIAGNOSIS — E785 Hyperlipidemia, unspecified: Secondary | ICD-10-CM

## 2019-10-17 DIAGNOSIS — E038 Other specified hypothyroidism: Secondary | ICD-10-CM | POA: Diagnosis not present

## 2019-10-17 NOTE — Patient Instructions (Addendum)
Michelle Miranda , Thank you for taking time to come for your Medicare Wellness Visit. I appreciate your ongoing commitment to your health goals. Please review the following plan we discussed and let me know if I can assist you in the future.   These are the goals we discussed: Goals    . Blood Pressure < 130/80    . Exercise 150 min/wk Moderate Activity    . Weight (lb) < 150 lb (68 kg)       This is a list of the screening recommended for you and due dates:  Health Maintenance  Topic Date Due  . Mammogram  08/01/2021  . Tetanus Vaccine  03/14/2028  . Colon Cancer Screening  01/02/2029  . Flu Shot  Completed  . DEXA scan (bone density measurement)  Completed  .  Hepatitis C: One time screening is recommended by Center for Disease Control  (CDC) for  adults born from [birth year] through 1963.   Completed  . Pneumonia vaccines  Completed     HYPERTENSION INFORMATION  Monitor your blood pressure at home, please keep a record and bring that in with you to your next office visit.   Go to the ER if any CP, SOB, nausea, dizziness, severe HA, changes vision/speech  Testing/Procedures: HOW TO TAKE YOUR BLOOD PRESSURE:  Rest 5 minutes before taking your blood pressure.  Don't smoke or drink caffeinated beverages for at least 30 minutes before.  Take your blood pressure before (not after) you eat.  Sit comfortably with your back supported and both feet on the floor (don't cross your legs).  Elevate your arm to heart level on a table or a desk.  Use the proper sized cuff. It should fit smoothly and snugly around your bare upper arm. There should be enough room to slip a fingertip under the cuff. The bottom edge of the cuff should be 1 inch above the crease of the elbow.    Your most recent BP: BP: 140/82   Take your medications faithfully as instructed. Maintain a healthy weight. Get at least 150 minutes of aerobic exercise per week. Minimize salt intake. Minimize alcohol  intake  DASH Eating Plan DASH stands for "Dietary Approaches to Stop Hypertension." The DASH eating plan is a healthy eating plan that has been shown to reduce high blood pressure (hypertension). Additional health benefits may include reducing the risk of type 2 diabetes mellitus, heart disease, and stroke. The DASH eating plan may also help with weight loss. WHAT DO I NEED TO KNOW ABOUT THE DASH EATING PLAN? For the DASH eating plan, you will follow these general guidelines:  Choose foods with a percent daily value for sodium of less than 5% (as listed on the food label).  Use salt-free seasonings or herbs instead of table salt or sea salt.  Check with your health care provider or pharmacist before using salt substitutes.  Eat lower-sodium products, often labeled as "lower sodium" or "no salt added."  Eat fresh foods.  Eat more vegetables, fruits, and low-fat dairy products.  Choose whole grains. Look for the word "whole" as the first word in the ingredient list.  Choose fish and skinless chicken or [place] more often than red meat. Limit fish, poultry, and meat to 6 oz (170 g) each day.  Limit sweets, desserts, sugars, and sugary drinks.  Choose heart-healthy fats.  Limit cheese to 1 oz (28 g) per day.  Eat more home-cooked food and less restaurant, buffet, and fast food.  Limit fried foods.  Cook foods using methods other than frying.  Limit canned vegetables. If you do use them, rinse them well to decrease the sodium.  When eating at a restaurant, ask that your food be prepared with less salt, or no salt if possible. WHAT FOODS CAN I EAT? Seek help from a dietitian for individual calorie needs. Grains Whole grain or whole wheat bread. Brown rice. Whole grain or whole wheat pasta. Quinoa, bulgur, and whole grain cereals. Low-sodium cereals. Corn or whole wheat flour tortillas. Whole grain cornbread. Whole grain crackers. Low-sodium crackers. Vegetables Fresh or frozen  vegetables (raw, steamed, roasted, or grilled). Low-sodium or reduced-sodium tomato and vegetable juices. Low-sodium or reduced-sodium tomato sauce and paste. Low-sodium or reduced-sodium canned vegetables.  Fruits All fresh, canned (in natural juice), or frozen fruits. Meat and Other Protein Products Ground beef (85% or leaner), grass-fed beef, or beef trimmed of fat. Skinless chicken or [place]. Ground chicken or [place]. Pork trimmed of fat. All fish and seafood. Eggs. Dried beans, peas, or lentils. Unsalted nuts and seeds. Unsalted canned beans. Dairy Low-fat dairy products, such as skim or 1% milk, 2% or reduced-fat cheeses, low-fat ricotta or cottage cheese, or plain low-fat yogurt. Low-sodium or reduced-sodium cheeses. Fats and Oils Tub margarines without trans fats. Light or reduced-fat mayonnaise and salad dressings (reduced sodium). Avocado. Safflower, olive, or canola oils. Natural peanut or almond butter. Other Unsalted popcorn and pretzels. The items listed above may not be a complete list of recommended foods or beverages. Contact your dietitian for more options. WHAT FOODS ARE NOT RECOMMENDED? Grains White bread. White pasta. White rice. Refined cornbread. Bagels and croissants. Crackers that contain trans fat. Vegetables Creamed or fried vegetables. Vegetables in a cheese sauce. Regular canned vegetables. Regular canned tomato sauce and paste. Regular tomato and vegetable juices. Fruits Dried fruits. Canned fruit in light or heavy syrup. Fruit juice. Meat and Other Protein Products Fatty cuts of meat. Ribs, chicken wings, bacon, sausage, bologna, salami, chitterlings, fatback, hot dogs, bratwurst, and packaged luncheon meats. Salted nuts and seeds. Canned beans with salt. Dairy Whole or 2% milk, cream, half-and-half, and cream cheese. Whole-fat or sweetened yogurt. Full-fat cheeses or blue cheese. Nondairy creamers and whipped toppings. Processed cheese, cheese spreads, or cheese  curds. Condiments Onion and garlic salt, seasoned salt, table salt, and sea salt. Canned and packaged gravies. Worcestershire sauce. Tartar sauce. Barbecue sauce. Teriyaki sauce. Soy sauce, including reduced sodium. Steak sauce. Fish sauce. Oyster sauce. Cocktail sauce. Horseradish. Ketchup and mustard. Meat flavorings and tenderizers. Bouillon cubes. Hot sauce. Tabasco sauce. Marinades. Taco seasonings. Relishes. Fats and Oils Butter, stick margarine, lard, shortening, ghee, and bacon fat. Coconut, palm kernel, or palm oils. Regular salad dressings. Other Pickles and olives. Salted popcorn and pretzels. The items listed above may not be a complete list of foods and beverages to avoid. Contact your dietitian for more information. WHERE CAN I FIND MORE INFORMATION? National Heart, Lung, and Blood Institute: travelstabloid.com Document Released: 07/08/2011 Document Revised: 12/03/2013 Document Reviewed: 05/23/2013 [facility] Patient Information 2015 [place], [place]. This information is not intended to replace advice given to you by your health care provider. Make sure you discuss any questions you have with your health care provider.

## 2019-10-18 LAB — COMPLETE METABOLIC PANEL WITH GFR
AG Ratio: 1.6 (calc) (ref 1.0–2.5)
ALT: 19 U/L (ref 6–29)
AST: 21 U/L (ref 10–35)
Albumin: 4.3 g/dL (ref 3.6–5.1)
Alkaline phosphatase (APISO): 76 U/L (ref 37–153)
BUN: 14 mg/dL (ref 7–25)
CO2: 25 mmol/L (ref 20–32)
Calcium: 9.2 mg/dL (ref 8.6–10.4)
Chloride: 104 mmol/L (ref 98–110)
Creat: 0.78 mg/dL (ref 0.50–0.99)
GFR, Est African American: 92 mL/min/{1.73_m2} (ref >=60)
GFR, Est Non African American: 79 mL/min/{1.73_m2} (ref >=60)
Globulin: 2.7 g/dL (calc) (ref 1.9–3.7)
Glucose, Bld: 94 mg/dL (ref 65–99)
Potassium: 4.4 mmol/L (ref 3.5–5.3)
Sodium: 141 mmol/L (ref 135–146)
Total Bilirubin: 0.4 mg/dL (ref 0.2–1.2)
Total Protein: 7 g/dL (ref 6.1–8.1)

## 2019-10-18 LAB — CBC WITH DIFFERENTIAL/PLATELET
Absolute Monocytes: 851 cells/uL (ref 200–950)
Basophils Absolute: 69 cells/uL (ref 0–200)
Basophils Relative: 0.8 %
Eosinophils Absolute: 318 cells/uL (ref 15–500)
Eosinophils Relative: 3.7 %
HCT: 40.8 % (ref 35.0–45.0)
Hemoglobin: 13.8 g/dL (ref 11.7–15.5)
Lymphs Abs: 3019 cells/uL (ref 850–3900)
MCH: 31 pg (ref 27.0–33.0)
MCHC: 33.8 g/dL (ref 32.0–36.0)
MCV: 91.7 fL (ref 80.0–100.0)
MPV: 10 fL (ref 7.5–12.5)
Monocytes Relative: 9.9 %
Neutro Abs: 4343 cells/uL (ref 1500–7800)
Neutrophils Relative %: 50.5 %
Platelets: 346 10*3/uL (ref 140–400)
RBC: 4.45 10*6/uL (ref 3.80–5.10)
RDW: 12 % (ref 11.0–15.0)
Total Lymphocyte: 35.1 %
WBC: 8.6 10*3/uL (ref 3.8–10.8)

## 2019-10-18 LAB — LIPID PANEL
Cholesterol: 200 mg/dL — ABNORMAL HIGH (ref <200)
HDL: 83 mg/dL (ref >=50)
LDL Cholesterol (Calc): 103 mg/dL (calc) — ABNORMAL HIGH
Non-HDL Cholesterol (Calc): 117 mg/dL (calc) (ref <130)
Total CHOL/HDL Ratio: 2.4 (calc) (ref <5.0)
Triglycerides: 58 mg/dL (ref <150)

## 2019-10-18 LAB — MAGNESIUM: Magnesium: 2.1 mg/dL (ref 1.5–2.5)

## 2019-10-18 LAB — TSH: TSH: 1.71 mIU/L (ref 0.40–4.50)

## 2019-11-29 IMAGING — MG DIGITAL SCREENING BILATERAL MAMMOGRAM WITH TOMO AND CAD
6 of 12 series · 6 of 36 positions shown · non-contrast
Comparison: Previous exam(s).

ACR Breast Density Category a: The breast tissue is almost entirely
fatty.

CLINICAL DATA: Screening.

EXAM:
DIGITAL SCREENING BILATERAL MAMMOGRAM WITH TOMO AND CAD

[L MLO synth-2D (1 of 2)]
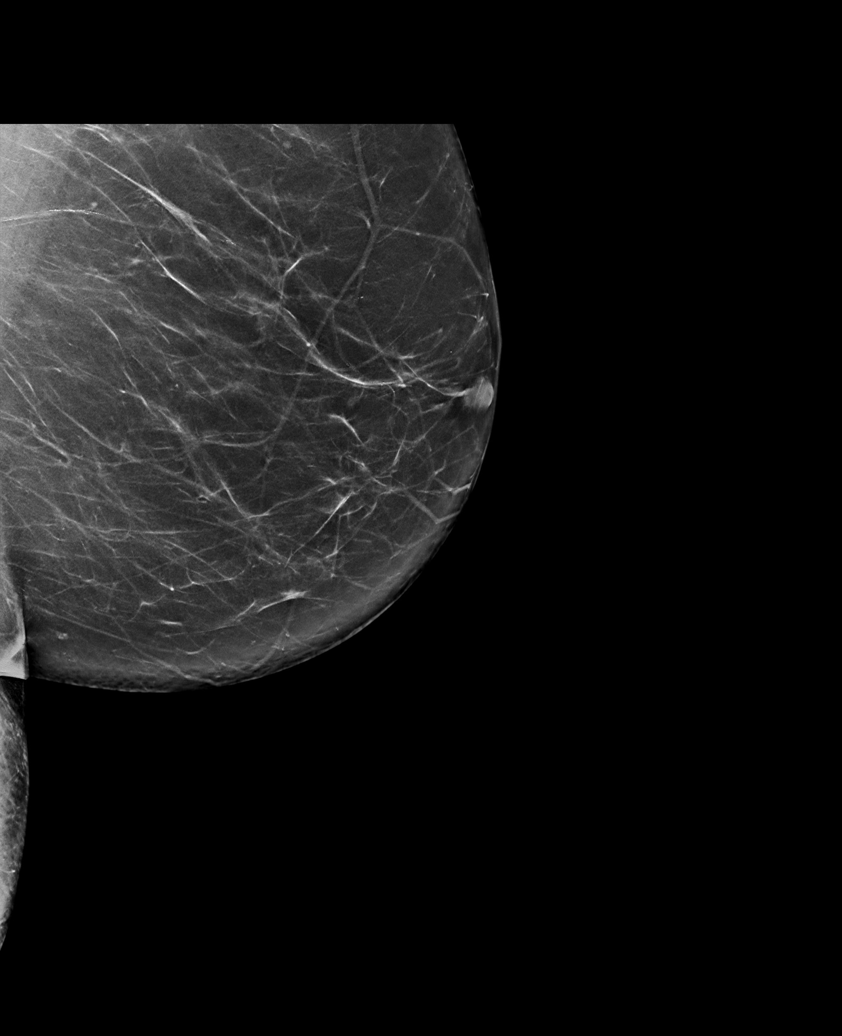

[L CC synth-2D]
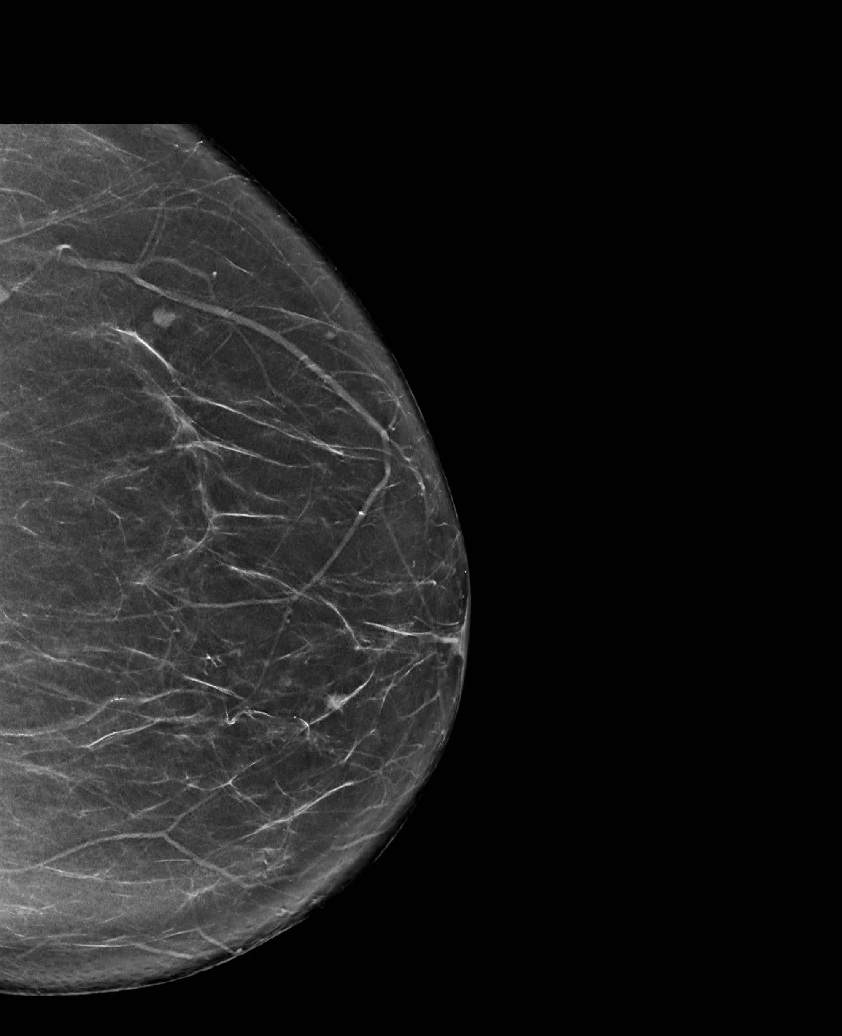

[L MLO synth-2D (2 of 2)]
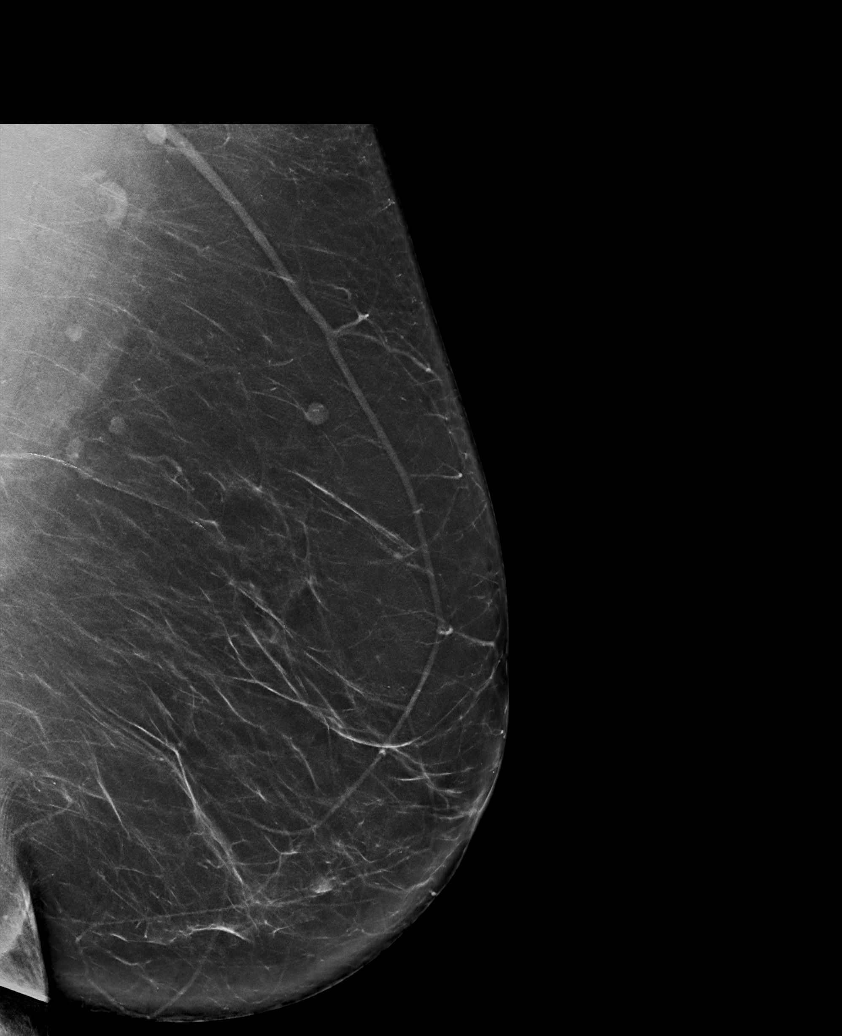

[R MLO synth-2D (1 of 2)]
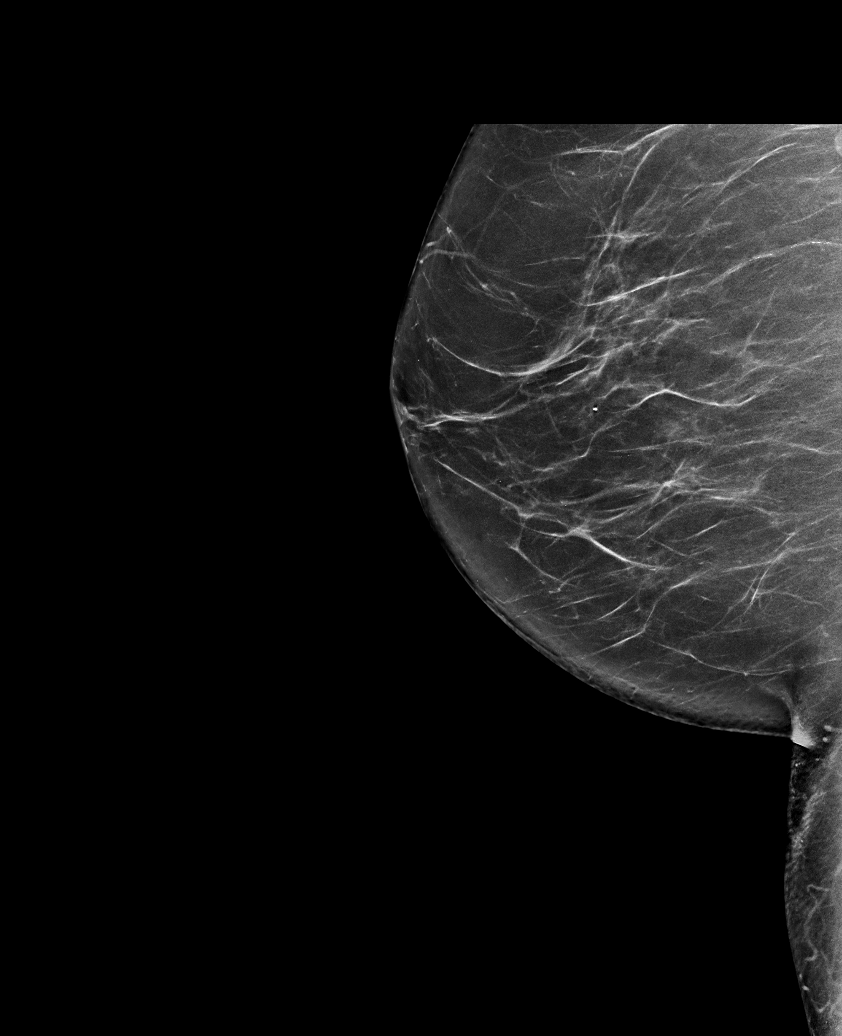

[R MLO synth-2D (2 of 2)]
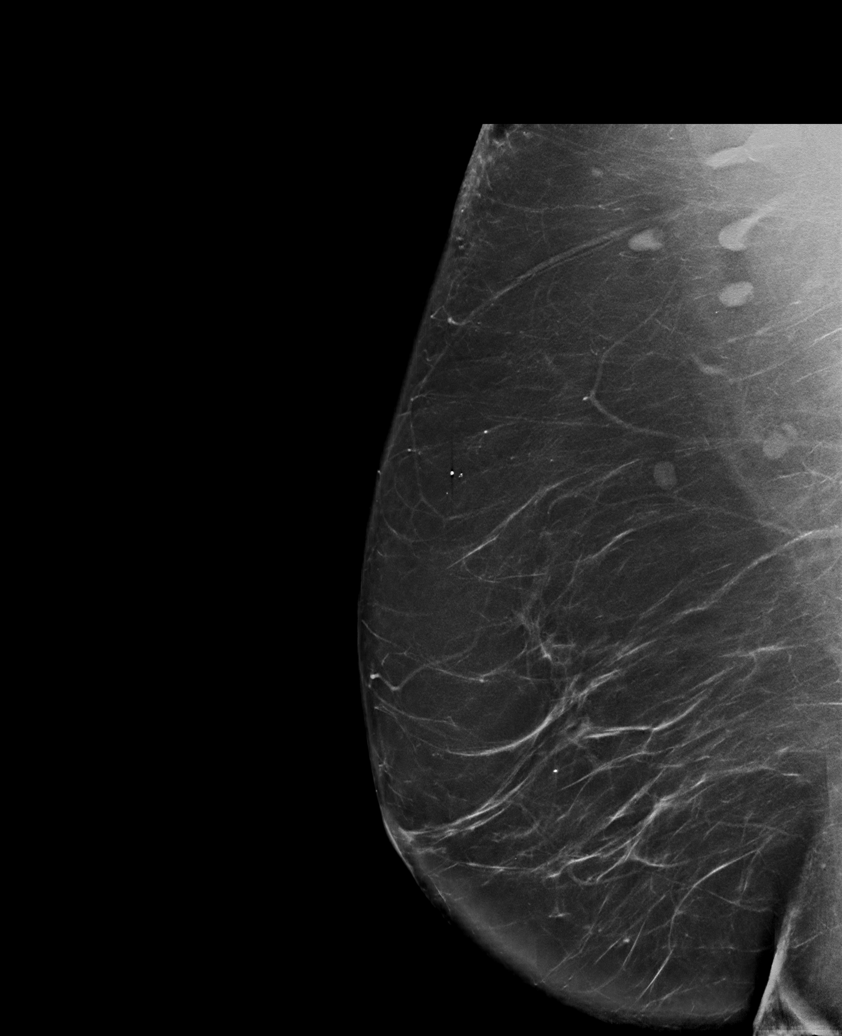

[R CC synth-2D]
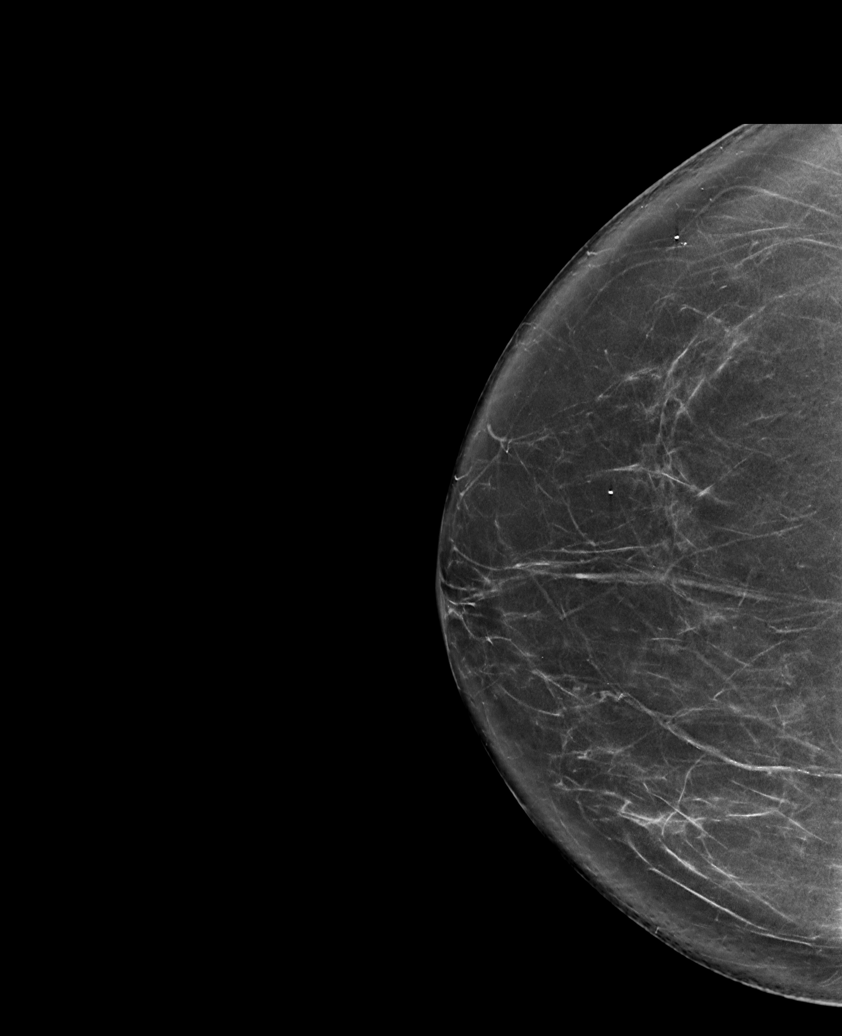

[6 of 36 positions shown; findings below may reference images not displayed]

FINDINGS: There are no findings suspicious for malignancy. Images were
processed with CAD.
IMPRESSION: No mammographic evidence of malignancy. A result letter of this
screening mammogram will be mailed directly to the patient.

RECOMMENDATION:
Screening mammogram in one year. (Code:[id number])

BI-RADS CATEGORY  1: Negative.

## 2019-12-17 ENCOUNTER — Other Ambulatory Visit: Payer: Self-pay | Admitting: Adult Health

## 2019-12-17 DIAGNOSIS — F325 Major depressive disorder, single episode, in full remission: Secondary | ICD-10-CM

## 2019-12-29 ENCOUNTER — Other Ambulatory Visit: Payer: Self-pay | Admitting: Adult Health

## 2019-12-29 MED ORDER — ALPRAZOLAM 0.5 MG PO TABS: ORAL_TABLET | ORAL | 0 refills | Status: DC

## 2020-01-17 NOTE — Progress Notes (Deleted)
FOLLOW UP  Assessment and Plan:   Essential hypertension Borderline above goal; discussed initiating medication today *** She declines at this time, plans to implement some lifestyle changes DASH diet, exercise and start monitoring at home.  Call if greater than 130/80.  - CBC with Differential/Platelet - CMP/GFR  Other specified hypothyroidism Managed by [name], now on armour thyroid and much improved.  Continue medications the same, reminded to take on an empty stomach 30-46mins before food.  Defer TSH as was just checked by [name]  Hyperlipidemia -continue medications, check lipids, decrease fatty foods, increase activity.  - Lipid panel  Morbid obesity ([place]) - BMI 35 with htn, hyperlipidemia, prediabetes Long discussion about weight loss, diet, and exercise Recommended diet heavy in fruits and veggies and low in animal meats, cheeses, and dairy products, appropriate calorie intake Discussed appropriate weight for height  Follow up at next visit  Depression, major, in remission ([place]) Cymbalta to 60 mg daily *** Lifestyle discussed: diet/exerise, sleep hygiene, stress management, hydration  Prediabetes Discussed general issues about diabetes pathophysiology and management., Educational material distributed., Suggested low cholesterol diet., Encouraged aerobic exercise., Discussed foot care., Reminded to get yearly retinal exam. Hemoglobin A1c q52m; monitor weight, serum glucose   Vitamin D deficiency Below goal at recent check; continue to recommend supplementation for goal of 60-100 Check vitamin D level  Medication management CBC, CMP/GFR  Continue diet and meds as discussed. Further disposition pending results of labs. Discussed med's effects and SE's.   Over 30 minutes of exam, counseling, chart review, and critical decision making was performed.   Future Appointments  Date Time Provider [place]  01/22/2020  2:30 PM Michelle Comber, Michelle Miranda  [facility] None  02/19/2020  2:00 PM Michelle Kingdom, MD [facility] None  08/04/2020  3:00 PM Michelle Pinto, MD [facility] None    ----------------------------------------------------------------------------------------------------------------------  HPI 69 y.o. female  presents for 3 month follow up on hypertension, cholesterol, prediabetes, hypothyroid, obesity, mood and vitamin D deficiency.   First grandchild (girl) arrived last year, doing well, watches her during the day a few days a week.   Depression has been in remission for many years cymbalta 60 mg daily until recently husband was diagnosed with cancer, was reporting panic attacks, xanax was refilled ***  She is on evista via Dr. Letta Median for osteoporosis; + vaginal dryness but albolene helps.   *** BP follow up   *** what labs ***  BMI is There is no height or weight on file to calculate BMI., she {HAS HAS KGM:01027} been working on diet and exercise. walking twice a week with granddaughter in stroller, 45 min.  Wt Readings from Last 3 Encounters:  10/17/19 180 lb (81.6 kg)  08/23/19 178 lb (80.7 kg)  07/09/19 174 lb 12.8 oz (79.3 kg)   Her blood pressure {HAS HAS NOT:18834} been controlled at home, today their BP is    She {DOES_DOES OZD:66440} workout. She denies chest pain, shortness of breath, dizziness.   She is not on cholesterol medication. Her cholesterol is not at goal. The cholesterol last visit was:   Lab Results  Component Value Date   CHOL 200 (H) 10/17/2019   HDL 83 10/17/2019   LDLCALC 103 (H) 10/17/2019   TRIG 58 10/17/2019   CHOLHDL 2.4 10/17/2019    She {Has/has not:18111} been working on diet and exercise for prediabetes, and denies {Symptoms; diabetes w/o none:19199}. Last A1C in the office was:  Lab Results  Component Value Date   HGBA1C 5.7 (H)  07/09/2019   She is on thyroid medication. Her medication was not changed last visit.   Lab Results  Component Value Date   TSH 1.71 10/17/2019    Patient is on Vitamin D supplement.   Lab Results  Component Value Date   VD25OH 48 07/09/2019        Current Medications:  Current Outpatient Medications on File Prior to Visit  Medication Sig  . ALPRAZolam (XANAX) 0.5 MG tablet Take 1/2-1 tab up to three times in a day for severe anxiety or panic attacks. Avoid taking daily due to addictive nature, limit to <5 days/week  . ARMOUR THYROID 90 MG tablet Take 0.5 tablets (45 mg total) by mouth daily.  Marland Kitchen BIOTIN PO Take 1 tablet by mouth daily.  . Cholecalciferol (VITAMIN D3) 5000 units CAPS Take by mouth.  . DULoxetine (CYMBALTA) 60 MG capsule Take 1 capsule Daily for Chronic Pain  . Methylcobalamin (B-12 FAST DISSOLVE SL) Place under the tongue daily.  . Multiple Vitamin (MULTIVITAMIN) tablet Take 1 tablet by mouth daily.  . raloxifene (EVISTA) 60 MG tablet Take 1 tablet Daily for Osteoporosis   No current facility-administered medications on file prior to visit.     Allergies:  Allergies  Allergen Reactions  . Oxycodone     "Found Oxycodone not effective for pain control" with last RTKA  . Other     Opiods,causes severe anxiety     Medical History:  Past Medical History:  Diagnosis Date  . Anxiety    occasional  . Arthritis   . Depression   . Hyperlipidemia   . Hypertension   . Hypothyroid    hypo thyroid  . Osteopenia   . Urinary tract infection 12/15/2011   once  . Vitamin D deficiency    Family history- Reviewed and unchanged Social history- Reviewed and unchanged   Review of Systems:  Review of Systems  Constitutional: Negative for malaise/fatigue and weight loss.  HENT: Negative for hearing loss and tinnitus.   Eyes: Negative for blurred vision and double vision.  Respiratory: Negative for cough, shortness of breath and wheezing.   Cardiovascular: Negative for chest pain, palpitations, orthopnea, claudication and leg swelling.  Gastrointestinal: Negative for abdominal pain, blood in stool,  constipation, diarrhea, heartburn, melena, nausea and vomiting.  Genitourinary: Negative.   Musculoskeletal: Negative for joint pain and myalgias.  Skin: Negative for rash.  Neurological: Negative for dizziness, tingling, sensory change, weakness and headaches.  Endo/Heme/Allergies: Negative for polydipsia.  Psychiatric/Behavioral: Negative for depression, substance abuse and suicidal ideas. The patient is nervous/anxious. The patient does not have insomnia.   All other systems reviewed and are negative.     Physical Exam: There were no vitals taken for this visit. Wt Readings from Last 3 Encounters:  10/17/19 180 lb (81.6 kg)  08/23/19 178 lb (80.7 kg)  07/09/19 174 lb 12.8 oz (79.3 kg)   General Appearance: Well nourished, in no apparent distress. Eyes: PERRLA, EOMs, conjunctiva no swelling or erythema Sinuses: No Frontal/maxillary tenderness ENT/Mouth: Ext aud canals clear, TMs without erythema, bulging. No erythema, swelling, or exudate on post pharynx.  Tonsils not swollen or erythematous. Hearing normal.  Neck: Supple, thyroid normal.  Respiratory: Respiratory effort normal, BS equal bilaterally without rales, rhonchi, wheezing or stridor.  Cardio: RRR with no MRGs. Brisk peripheral pulses without edema.  Abdomen: Soft, + BS.  Non tender, no guarding, rebound, hernias, masses. Lymphatics: Non tender without lymphadenopathy.  Musculoskeletal: Full ROM, 5/5 strength, {PSY - GAIT AND STATION:22860} gait  Skin: Warm, dry without rashes, lesions, ecchymosis.  Neuro: Cranial nerves intact. No cerebellar symptoms.  Psych: Awake and oriented X 3, normal affect, Insight and Judgment appropriate.    Michelle Ribas, Michelle Miranda 12:14 PM [facility]

## 2020-01-22 ENCOUNTER — Ambulatory Visit: Payer: Medicare Other | Admitting: Adult Health

## 2020-01-28 NOTE — Progress Notes (Deleted)
FOLLOW UP  Assessment and Plan:   Essential hypertension Borderline above goal; discussed initiating medication today *** She declines at this time, plans to implement some lifestyle changes DASH diet, exercise and start monitoring at home.  Call if greater than 130/80.  - CBC with Differential/Platelet - CMP/GFR  Other specified hypothyroidism Managed by [name], now on armour thyroid and much improved.  Continue medications the same, reminded to take on an empty stomach 30-11mins before food.  Defer TSH as was just checked by [name]  Hyperlipidemia -continue medications, check lipids, decrease fatty foods, increase activity.  - Lipid panel  Morbid obesity ([place]) - BMI 35 with htn, hyperlipidemia, prediabetes Long discussion about weight loss, diet, and exercise Recommended diet heavy in fruits and veggies and low in animal meats, cheeses, and dairy products, appropriate calorie intake Discussed appropriate weight for height  Follow up at next visit  Depression, major, in remission ([place]) Cymbalta to 60 mg daily *** Lifestyle discussed: diet/exerise, sleep hygiene, stress management, hydration  Prediabetes Discussed general issues about diabetes pathophysiology and management., Educational material distributed., Suggested low cholesterol diet., Encouraged aerobic exercise., Discussed foot care., Reminded to get yearly retinal exam. Hemoglobin A1c q50m; monitor weight, serum glucose   Vitamin D deficiency Below goal at recent check; continue to recommend supplementation for goal of 60-100 Check vitamin D level  Medication management CBC, CMP/GFR  Continue diet and meds as discussed. Further disposition pending results of labs. Discussed med's effects and SE's.   Over 30 minutes of exam, counseling, chart review, and critical decision making was performed.   Future Appointments  Date Time Provider [place]  01/30/2020 11:30 AM Liane Comber, NP  [facility] None  02/19/2020  2:00 PM Philemon Kingdom, MD [facility] None  08/04/2020  3:00 PM Unk Pinto, MD [facility] None    ----------------------------------------------------------------------------------------------------------------------  HPI 69 y.o. female  presents for 3 month follow up on hypertension, cholesterol, prediabetes, hypothyroid, obesity, mood and vitamin D deficiency.   First grandchild (girl) arrived last year, doing well, watches her during the day a few days a week.   Depression has been in remission for many years cymbalta 60 mg daily until recently husband was diagnosed with cancer, was reporting panic attacks, xanax was refilled ***  She is on evista via Dr. Letta Median for osteoporosis; + vaginal dryness but albolene helps.   *** BP follow up   *** what labs ***  BMI is There is no height or weight on file to calculate BMI., she {HAS HAS IEP:32951} been working on diet and exercise. walking twice a week with granddaughter in stroller, 45 min.  Wt Readings from Last 3 Encounters:  10/17/19 180 lb (81.6 kg)  08/23/19 178 lb (80.7 kg)  07/09/19 174 lb 12.8 oz (79.3 kg)   Her blood pressure {HAS HAS NOT:18834} been controlled at home, today their BP is    She {DOES_DOES OAC:16606} workout. She denies chest pain, shortness of breath, dizziness.   She is not on cholesterol medication. Her cholesterol is not at goal. The cholesterol last visit was:   Lab Results  Component Value Date   CHOL 200 (H) 10/17/2019   HDL 83 10/17/2019   LDLCALC 103 (H) 10/17/2019   TRIG 58 10/17/2019   CHOLHDL 2.4 10/17/2019    She {Has/has not:18111} been working on diet and exercise for prediabetes, and denies {Symptoms; diabetes w/o none:19199}. Last A1C in the office was:  Lab Results  Component Value Date   HGBA1C 5.7 (H) 07/09/2019  She is on thyroid medication. Her medication was not changed last visit.   Lab Results  Component Value Date   TSH 1.71 10/17/2019    Patient is on Vitamin D supplement.  *** Lab Results  Component Value Date   VD25OH 48 07/09/2019        Current Medications:  Current Outpatient Medications on File Prior to Visit  Medication Sig  . ALPRAZolam (XANAX) 0.5 MG tablet Take 1/2-1 tab up to three times in a day for severe anxiety or panic attacks. Avoid taking daily due to addictive nature, limit to <5 days/week  . ARMOUR THYROID 90 MG tablet Take 0.5 tablets (45 mg total) by mouth daily.  Marland Kitchen BIOTIN PO Take 1 tablet by mouth daily.  . Cholecalciferol (VITAMIN D3) 5000 units CAPS Take by mouth.  . DULoxetine (CYMBALTA) 60 MG capsule Take 1 capsule Daily for Chronic Pain  . Methylcobalamin (B-12 FAST DISSOLVE SL) Place under the tongue daily.  . Multiple Vitamin (MULTIVITAMIN) tablet Take 1 tablet by mouth daily.  . raloxifene (EVISTA) 60 MG tablet Take 1 tablet Daily for Osteoporosis   No current facility-administered medications on file prior to visit.     Allergies:  Allergies  Allergen Reactions  . Oxycodone     "Found Oxycodone not effective for pain control" with last RTKA  . Other     Opiods,causes severe anxiety     Medical History:  Past Medical History:  Diagnosis Date  . Anxiety    occasional  . Arthritis   . Depression   . Hyperlipidemia   . Hypertension   . Hypothyroid    hypo thyroid  . Osteopenia   . Urinary tract infection 12/15/2011   once  . Vitamin D deficiency    Family history- Reviewed and unchanged Social history- Reviewed and unchanged   Review of Systems:  Review of Systems  Constitutional: Negative for malaise/fatigue and weight loss.  HENT: Negative for hearing loss and tinnitus.   Eyes: Negative for blurred vision and double vision.  Respiratory: Negative for cough, shortness of breath and wheezing.   Cardiovascular: Negative for chest pain, palpitations, orthopnea, claudication and leg swelling.  Gastrointestinal: Negative for abdominal pain, blood in stool,  constipation, diarrhea, heartburn, melena, nausea and vomiting.  Genitourinary: Negative.   Musculoskeletal: Negative for joint pain and myalgias.  Skin: Negative for rash.  Neurological: Negative for dizziness, tingling, sensory change, weakness and headaches.  Endo/Heme/Allergies: Negative for polydipsia.  Psychiatric/Behavioral: Negative for depression, substance abuse and suicidal ideas. The patient is nervous/anxious. The patient does not have insomnia.   All other systems reviewed and are negative.     Physical Exam: There were no vitals taken for this visit. Wt Readings from Last 3 Encounters:  10/17/19 180 lb (81.6 kg)  08/23/19 178 lb (80.7 kg)  07/09/19 174 lb 12.8 oz (79.3 kg)   General Appearance: Well nourished, in no apparent distress. Eyes: PERRLA, EOMs, conjunctiva no swelling or erythema Sinuses: No Frontal/maxillary tenderness ENT/Mouth: Ext aud canals clear, TMs without erythema, bulging. No erythema, swelling, or exudate on post pharynx.  Tonsils not swollen or erythematous. Hearing normal.  Neck: Supple, thyroid normal.  Respiratory: Respiratory effort normal, BS equal bilaterally without rales, rhonchi, wheezing or stridor.  Cardio: RRR with no MRGs. Brisk peripheral pulses without edema.  Abdomen: Soft, + BS.  Non tender, no guarding, rebound, hernias, masses. Lymphatics: Non tender without lymphadenopathy.  Musculoskeletal: Full ROM, 5/5 strength, {PSY - GAIT AND STATION:22860} gait Skin: Warm, dry  without rashes, lesions, ecchymosis.  Neuro: Cranial nerves intact. No cerebellar symptoms.  Psych: Awake and oriented X 3, normal affect, Insight and Judgment appropriate.    Izora Ribas, NP 1:08 PM [facility]

## 2020-01-30 ENCOUNTER — Ambulatory Visit: Payer: Medicare Other | Admitting: Adult Health

## 2020-02-08 NOTE — Progress Notes (Signed)
FOLLOW UP  Assessment and Plan:   Essential hypertension Borderline above goal; discussed initiating medication today  She declines at this time, plans to implement some lifestyle changes DASH diet, exercise and start monitoring at home.  Call if greater than 130/80.  - CBC with Differential/Platelet - CMP/GFR  Other specified hypothyroidism Managed by [name], now on armour thyroid and much improved.  Continue medications the same, reminded to take on an empty stomach 30-29mins before food.  Defer TSH as was just checked by [name]  Hyperlipidemia -continue medications, check lipids, decrease fatty foods, increase activity.  - Lipid panel  Obesity - BMI 32 with htn, hyperlipidemia, prediabetes Long discussion about weight loss, diet, and exercise Recommended diet heavy in fruits and veggies and low in animal meats, cheeses, and dairy products, appropriate calorie intake Discussed appropriate weight for height  Follow up at next visit  Depression, major, in remission ([place]) Cymbalta to 60 mg daily, PRN xanax for grief related anxiety, will add vistaril PRN, she plans to start grief counseling; follow up sooner if needed Lifestyle discussed: diet/exerise, sleep hygiene, stress management, hydration  Prediabetes Discussed general issues about diabetes pathophysiology and management., Educational material distributed., Suggested low cholesterol diet., Encouraged aerobic exercise., Discussed foot care., Reminded to get yearly retinal exam. Hemoglobin A1c - defer to CPE    Vitamin D deficiency Below goal at recent check; continue to recommend supplementation for goal of 60-100 Check vitamin D level  Medication management CBC, CMP/GFR  Continue diet and meds as discussed. Further disposition pending results of labs. Discussed med's effects and SE's.   Over 30 minutes of exam, counseling, chart review, and critical decision making was performed.   Future Appointments   Date Time Provider [place]  02/19/2020  2:00 PM Philemon Kingdom, MD [facility] None  08/04/2020  3:00 PM Unk Pinto, MD [facility] None    ----------------------------------------------------------------------------------------------------------------------  HPI 69 y.o. female  presents for 3 month follow up on hypertension, cholesterol, prediabetes, hypothyroid, obesity, mood and vitamin D deficiency. First grandchild (girl) arrived 2 years ago, doing well, watches her during the day a few days a week.   Husband was diagnosed with ? cholangiocarcinoma with liver mets but with rapid progression and passed June 24th, passed in just a few weeks.   Hx of major depression has been in remission for many years cymbalta 60 mg daily until recently husband was diagnosed with cancer, was reporting panic attacks, xanax was refilled, patient reports is taking as needed, waits until she feels as though she may have a panic attack, takes 0.25 mg first and repeats if needed, typically needs in the morning when she realizes husband is no longer there, is currently needing most days. Planning to start grief counseling via hospice.   She is on evista via Dr. Letta Median for osteoporosis; + vaginal dryness but albolene helps.   BMI is Body mass index is 32.73 kg/m., she has not been working on diet and exercise. walking twice a week with granddaughter in stroller, 45 min.  Wt Readings from Last 3 Encounters:  02/11/20 164 lb 12.8 oz (74.8 kg)  10/17/19 180 lb (81.6 kg)  08/23/19 178 lb (80.7 kg)   Her blood pressure has been controlled at home, today their BP is BP: 132/82  She does workout. She denies chest pain, shortness of breath, dizziness.   She is not on cholesterol medication. Her cholesterol is not at goal. The cholesterol last visit was:   Lab Results  Component Value  Date   CHOL 200 (H) 10/17/2019   HDL 83 10/17/2019   LDLCALC 103 (H) 10/17/2019   TRIG 58 10/17/2019   CHOLHDL  2.4 10/17/2019    She has been working on diet and exercise for prediabetes, and denies increased appetite, nausea, paresthesia of the feet, polydipsia, polyuria and visual disturbances. Last A1C in the office was:  Lab Results  Component Value Date   HGBA1C 5.7 (H) 07/09/2019   She is on thyroid medication. Her medication was not changed last visit.   Lab Results  Component Value Date   TSH 1.71 10/17/2019   Patient is on Vitamin D supplement, taking 5000 IU daily  Lab Results  Component Value Date   VD25OH 48 07/09/2019        Current Medications:  Current Outpatient Medications on File Prior to Visit  Medication Sig  . ALPRAZolam (XANAX) 0.5 MG tablet Take 1/2-1 tab up to three times in a day for severe anxiety or panic attacks. Avoid taking daily due to addictive nature, limit to <5 days/week  . ARMOUR THYROID 90 MG tablet Take 0.5 tablets (45 mg total) by mouth daily.  . Cholecalciferol (VITAMIN D3) 5000 units CAPS Take by mouth.  . DULoxetine (CYMBALTA) 60 MG capsule Take 1 capsule Daily for Chronic Pain  . Methylcobalamin (B-12 FAST DISSOLVE SL) Place under the tongue daily.  . Multiple Vitamin (MULTIVITAMIN) tablet Take 1 tablet by mouth daily.  . raloxifene (EVISTA) 60 MG tablet Take 1 tablet Daily for Osteoporosis  . BIOTIN PO Take 1 tablet by mouth daily. (Patient not taking: Reported on 02/11/2020)   No current facility-administered medications on file prior to visit.     Allergies:  Allergies  Allergen Reactions  . Oxycodone     "Found Oxycodone not effective for pain control" with last RTKA  . Other     Opiods,causes severe anxiety     Medical History:  Past Medical History:  Diagnosis Date  . Anxiety    occasional  . Arthritis   . Depression   . Hyperlipidemia   . Hypertension   . Hypothyroid    hypo thyroid  . Osteopenia   . Urinary tract infection 12/15/2011   once  . Vitamin D deficiency    Family history- Reviewed and unchanged Social  history- Reviewed and unchanged   Review of Systems:  Review of Systems  Constitutional: Negative for malaise/fatigue and weight loss.  HENT: Negative for hearing loss and tinnitus.   Eyes: Negative for blurred vision and double vision.  Respiratory: Negative for cough, shortness of breath and wheezing.   Cardiovascular: Negative for chest pain, palpitations, orthopnea, claudication and leg swelling.  Gastrointestinal: Negative for abdominal pain, blood in stool, constipation, diarrhea, heartburn, melena, nausea and vomiting.  Genitourinary: Negative.   Musculoskeletal: Negative for joint pain and myalgias.  Skin: Negative for rash.  Neurological: Negative for dizziness, tingling, sensory change, weakness and headaches.  Endo/Heme/Allergies: Negative for polydipsia.  Psychiatric/Behavioral: Positive for depression. Negative for substance abuse and suicidal ideas. The patient is nervous/anxious. The patient does not have insomnia.   All other systems reviewed and are negative.     Physical Exam: BP 132/82   Pulse 77   Temp 97.7 F (36.5 C)   Ht 4' 11.5" (1.511 m)   Wt 164 lb 12.8 oz (74.8 kg)   SpO2 96%   BMI 32.73 kg/m  Wt Readings from Last 3 Encounters:  02/11/20 164 lb 12.8 oz (74.8 kg)  10/17/19 180 lb (  81.6 kg)  08/23/19 178 lb (80.7 kg)   General Appearance: Well nourished, in no apparent distress. Eyes: PERRLA, EOMs, conjunctiva no swelling or erythema Sinuses: No Frontal/maxillary tenderness ENT/Mouth: Ext aud canals clear, TMs without erythema, bulging. No erythema, swelling, or exudate on post pharynx.  Tonsils not swollen or erythematous. Hearing normal.  Neck: Supple, thyroid normal.  Respiratory: Respiratory effort normal, BS equal bilaterally without rales, rhonchi, wheezing or stridor.  Cardio: RRR with no MRGs. Brisk peripheral pulses without edema.  Abdomen: Soft, + BS.  Non tender, no guarding, rebound, hernias, masses. Lymphatics: Non tender without  lymphadenopathy.  Musculoskeletal: Full ROM, 5/5 strength, Normal gait Skin: Warm, dry without rashes, lesions, ecchymosis.  Neuro: Cranial nerves intact. No cerebellar symptoms.  Psych: Awake and oriented X 3, normal/tearful affect, Insight and Judgment appropriate.    Izora Ribas, NP 11:33 AM [facility]

## 2020-02-11 ENCOUNTER — Other Ambulatory Visit: Payer: Self-pay

## 2020-02-11 ENCOUNTER — Encounter: Payer: Self-pay | Admitting: Adult Health

## 2020-02-11 ENCOUNTER — Ambulatory Visit (INDEPENDENT_AMBULATORY_CARE_PROVIDER_SITE_OTHER): Payer: Medicare Other | Admitting: Adult Health

## 2020-02-11 VITALS — BP 132/82 | HR 77 | Temp 97.7°F | Ht 59.5 in | Wt 164.8 lb

## 2020-02-11 DIAGNOSIS — E038 Other specified hypothyroidism: Secondary | ICD-10-CM | POA: Diagnosis not present

## 2020-02-11 DIAGNOSIS — E785 Hyperlipidemia, unspecified: Secondary | ICD-10-CM | POA: Diagnosis not present

## 2020-02-11 DIAGNOSIS — F325 Major depressive disorder, single episode, in full remission: Secondary | ICD-10-CM

## 2020-02-11 DIAGNOSIS — I10 Essential (primary) hypertension: Secondary | ICD-10-CM

## 2020-02-11 DIAGNOSIS — E559 Vitamin D deficiency, unspecified: Secondary | ICD-10-CM | POA: Diagnosis not present

## 2020-02-11 DIAGNOSIS — E063 Autoimmune thyroiditis: Secondary | ICD-10-CM

## 2020-02-11 DIAGNOSIS — R7309 Other abnormal glucose: Secondary | ICD-10-CM

## 2020-02-11 MED ORDER — DULOXETINE HCL 60 MG PO CPEP: ORAL_CAPSULE | ORAL | 0 refills | Status: DC

## 2020-02-11 MED ORDER — HYDROXYZINE HCL 25 MG PO TABS: 12.5000 mg | ORAL_TABLET | Freq: Three times a day (TID) | ORAL | 2 refills | Status: DC | PRN

## 2020-02-11 NOTE — Patient Instructions (Addendum)
Goals    . Blood Pressure < 130/80    . Exercise 150 min/wk Moderate Activity    . Weight (lb) < 150 lb (68 kg)      Try taking vistaril/atarax in the morning right when you wake up - do regularly for a few weeks and see if this helps reduce xanax need in the morning      Hydroxyzine capsules or tablets What is this medicine? HYDROXYZINE (hye [place] i zeen) is an antihistamine. This medicine is used to treat allergy symptoms. It is also used to treat anxiety and tension. This medicine can be used with other medicines to induce sleep before surgery. This medicine may be used for other purposes; ask your health care provider or pharmacist if you have questions. COMMON BRAND NAME(S): ANX, Atarax, Rezine, Vistaril What should I tell my health care provider before I take this medicine? They need to know if you have any of these conditions:  glaucoma  heart disease  history of irregular heartbeat  kidney disease  liver disease  lung or breathing disease, like asthma  stomach or intestine problems  thyroid disease  trouble passing urine  an unusual or allergic reaction to hydroxyzine, cetirizine, other medicines, foods, dyes or preservatives  pregnant or trying to get pregnant  breast-feeding How should I use this medicine? Take this medicine by mouth with a full glass of water. Follow the directions on the prescription label. You may take this medicine with food or on an empty stomach. Take your medicine at regular intervals. Do not take your medicine more often than directed. Talk to your pediatrician regarding the use of this medicine in children. Special care may be needed. While this drug may be prescribed for children as young as 71 years of age for selected conditions, precautions do apply. Patients over 49 years old may have a stronger reaction and need a smaller dose. Overdosage: If you think you have taken too much of this medicine contact a poison control center or  emergency room at once. NOTE: This medicine is only for you. Do not share this medicine with others. What if I miss a dose? If you miss a dose, take it as soon as you can. If it is almost time for your next dose, take only that dose. Do not take double or extra doses. What may interact with this medicine? Do not take this medicine with any of the following medications:  cisapride  dronedarone  pimozide  thioridazine This medicine may also interact with the following medications:  alcohol  antihistamines for allergy, cough, and cold  atropine  barbiturate medicines for sleep or seizures, like phenobarbital  certain antibiotics like erythromycin or clarithromycin  certain medicines for anxiety or sleep  certain medicines for bladder problems like oxybutynin, tolterodine  certain medicines for depression or psychotic disturbances  certain medicines for irregular heart beat  certain medicines for Parkinson's disease like benztropine, trihexyphenidyl  certain medicines for seizures like phenobarbital, primidone  certain medicines for stomach problems like dicyclomine, hyoscyamine  certain medicines for travel sickness like scopolamine  ipratropium  narcotic medicines for pain  other medicines that prolong the QT interval (an abnormal heart rhythm) like dofetilide This list may not describe all possible interactions. Give your health care provider a list of all the medicines, herbs, non-prescription drugs, or dietary supplements you use. Also tell them if you smoke, drink alcohol, or use illegal drugs. Some items may interact with your medicine. What should I watch for  while using this medicine? Tell your doctor or health care professional if your symptoms do not improve. You may get drowsy or dizzy. Do not drive, use machinery, or do anything that needs mental alertness until you know how this medicine affects you. Do not stand or sit up quickly, especially if you are an  older patient. This reduces the risk of dizzy or fainting spells. Alcohol may interfere with the effect of this medicine. Avoid alcoholic drinks. Your mouth may get dry. Chewing sugarless gum or sucking hard candy, and drinking plenty of water may help. Contact your doctor if the problem does not go away or is severe. This medicine may cause dry eyes and blurred vision. If you wear contact lenses you may feel some discomfort. Lubricating drops may help. See your eye doctor if the problem does not go away or is severe. If you are receiving skin tests for allergies, tell your doctor you are using this medicine. What side effects may I notice from receiving this medicine? Side effects that you should report to your doctor or health care professional as soon as possible:  allergic reactions like skin rash, itching or hives, swelling of the face, lips, or tongue  changes in vision  confusion  fast, irregular heartbeat  seizures  tremor  trouble passing urine or change in the amount of urine Side effects that usually do not require medical attention (report to your doctor or health care professional if they continue or are bothersome):  constipation  drowsiness  dry mouth  headache  tiredness This list may not describe all possible side effects. Call your doctor for medical advice about side effects. You may report side effects to FDA at 1-800-FDA-1088. Where should I keep my medicine? Keep out of the reach of children. Store at room temperature between 15 and 30 degrees C (59 and 86 degrees F). Keep container tightly closed. Throw away any unused medicine after the expiration date. NOTE: This sheet is a summary. It may not cover all possible information. If you have questions about this medicine, talk to your doctor, pharmacist, or health care provider.  2020 Elsevier/Gold Standard (2018-07-10 13:19:55)

## 2020-02-12 LAB — COMPLETE METABOLIC PANEL WITH GFR
AG Ratio: 1.6 (calc) (ref 1.0–2.5)
ALT: 22 U/L (ref 6–29)
AST: 24 U/L (ref 10–35)
Albumin: 4.1 g/dL (ref 3.6–5.1)
Alkaline phosphatase (APISO): 82 U/L (ref 37–153)
BUN: 13 mg/dL (ref 7–25)
CO2: 27 mmol/L (ref 20–32)
Calcium: 9.3 mg/dL (ref 8.6–10.4)
Chloride: 106 mmol/L (ref 98–110)
Creat: 0.71 mg/dL (ref 0.50–0.99)
GFR, Est African American: 102 mL/min/{1.73_m2} (ref >=60)
GFR, Est Non African American: 88 mL/min/{1.73_m2} (ref >=60)
Globulin: 2.6 g/dL (calc) (ref 1.9–3.7)
Glucose, Bld: 121 mg/dL — ABNORMAL HIGH (ref 65–99)
Potassium: 4.8 mmol/L (ref 3.5–5.3)
Sodium: 141 mmol/L (ref 135–146)
Total Bilirubin: 0.3 mg/dL (ref 0.2–1.2)
Total Protein: 6.7 g/dL (ref 6.1–8.1)

## 2020-02-12 LAB — VITAMIN D 25 HYDROXY (VIT D DEFICIENCY, FRACTURES): Vit D, 25-Hydroxy: 68 ng/mL (ref 30–100)

## 2020-02-12 LAB — CBC WITH DIFFERENTIAL/PLATELET
Absolute Monocytes: 728 cells/uL (ref 200–950)
Basophils Absolute: 53 cells/uL (ref 0–200)
Basophils Relative: 0.7 %
Eosinophils Absolute: 98 cells/uL (ref 15–500)
Eosinophils Relative: 1.3 %
HCT: 40.9 % (ref 35.0–45.0)
Hemoglobin: 13.7 g/dL (ref 11.7–15.5)
Lymphs Abs: 1305 cells/uL (ref 850–3900)
MCH: 30.6 pg (ref 27.0–33.0)
MCHC: 33.5 g/dL (ref 32.0–36.0)
MCV: 91.5 fL (ref 80.0–100.0)
MPV: 9.9 fL (ref 7.5–12.5)
Monocytes Relative: 9.7 %
Neutro Abs: 5318 cells/uL (ref 1500–7800)
Neutrophils Relative %: 70.9 %
Platelets: 317 10*3/uL (ref 140–400)
RBC: 4.47 10*6/uL (ref 3.80–5.10)
RDW: 13.1 % (ref 11.0–15.0)
Total Lymphocyte: 17.4 %
WBC: 7.5 10*3/uL (ref 3.8–10.8)

## 2020-02-12 LAB — LIPID PANEL
Cholesterol: 178 mg/dL (ref <200)
HDL: 71 mg/dL (ref >=50)
LDL Cholesterol (Calc): 92 mg/dL (calc)
Non-HDL Cholesterol (Calc): 107 mg/dL (calc) (ref <130)
Total CHOL/HDL Ratio: 2.5 (calc) (ref <5.0)
Triglycerides: 63 mg/dL (ref <150)

## 2020-02-12 LAB — MAGNESIUM: Magnesium: 2.3 mg/dL (ref 1.5–2.5)

## 2020-02-12 LAB — TSH: TSH: 2.06 mIU/L (ref 0.40–4.50)

## 2020-02-14 ENCOUNTER — Other Ambulatory Visit: Payer: Self-pay | Admitting: Adult Health

## 2020-02-15 ENCOUNTER — Other Ambulatory Visit: Payer: Self-pay | Admitting: Adult Health

## 2020-02-15 MED ORDER — BUSPIRONE HCL 5 MG PO TABS: ORAL_TABLET | ORAL | 0 refills | Status: DC

## 2020-02-15 MED ORDER — ALPRAZOLAM 0.5 MG PO TABS: ORAL_TABLET | ORAL | 0 refills | Status: DC

## 2020-02-19 ENCOUNTER — Other Ambulatory Visit: Payer: Self-pay

## 2020-02-19 ENCOUNTER — Ambulatory Visit (INDEPENDENT_AMBULATORY_CARE_PROVIDER_SITE_OTHER): Payer: Medicare Other | Admitting: Adult Health Nurse Practitioner

## 2020-02-19 ENCOUNTER — Ambulatory Visit: Payer: Medicare Other | Admitting: Internal Medicine

## 2020-02-19 ENCOUNTER — Encounter: Payer: Self-pay | Admitting: Adult Health Nurse Practitioner

## 2020-02-19 VITALS — BP 122/80 | HR 102 | Temp 97.7°F | Wt 160.0 lb

## 2020-02-19 DIAGNOSIS — F13930 Sedative, hypnotic or anxiolytic use, unspecified with withdrawal, uncomplicated: Secondary | ICD-10-CM

## 2020-02-19 DIAGNOSIS — I10 Essential (primary) hypertension: Secondary | ICD-10-CM

## 2020-02-19 DIAGNOSIS — F325 Major depressive disorder, single episode, in full remission: Secondary | ICD-10-CM | POA: Diagnosis not present

## 2020-02-19 DIAGNOSIS — F419 Anxiety disorder, unspecified: Secondary | ICD-10-CM | POA: Diagnosis not present

## 2020-02-19 DIAGNOSIS — F13230 Sedative, hypnotic or anxiolytic dependence with withdrawal, uncomplicated: Secondary | ICD-10-CM | POA: Diagnosis not present

## 2020-02-19 NOTE — Patient Instructions (Addendum)
   Alprazolam 0.5mg  half tablet twice a day for one week.  Take Buspar once day  THEN  Take 0.5mg  half tablet once a day for one week    Take Buspar twice a day  THEN  Take 0.5mg  tablet three days a week for one week Take Buspar three times a day  THEN take 0.5mg  tablet one day    Please contact office with any new or worsening symptoms.

## 2020-02-19 NOTE — Progress Notes (Signed)
Assessment and Plan:  Diagnoses and all orders for this visit:  Benzodiazepine withdrawal without complication ([place]) Discussed tapering medication even on small dose abrupt stop can cause unwanted side effects. Printed education, taper schedule for patient Persistent symptoms, thyroid? Follows with Dr Cruzita Lederer, appointment in one day.  Anxiety Depression, major, in remission ([place]) Continue Cymbalta 60mg  daily. Discussed possibly changing this after decreasing alprazolam Continue Buspar daily, titrate up to three times a day.  Provided schedule for patient to help with clarity of this Discussed medication and side effects with patient   Essential hypertension Controlled today, tachycardia, likely anxiety, continue to monitor. Monitor blood pressure at home; call if consistently over 130/80 Continue DASH diet.   Reminder to go to the ER if any CP, SOB, nausea, dizziness, severe HA, changes vision/speech, left arm numbness and tingling and jaw pain.   Close follow up in 2 weeks on mood and medication transition.  Contact office with any new or worsening, persistent symptoms.   Further disposition pending results of labs. Discussed med's effects and SE's.   Over 30 minutes of face to face interview, exam, counseling, chart review, and critical decision making was performed.   Future Appointments  Date Time Provider [place]  08/04/2020  3:00 PM Unk Pinto, MD [id number] None  02/19/2021  2:40 PM Philemon Kingdom, MD [id number] None    ------------------------------------------------------------------------------------------------------------------   HPI 69 y.o.female presents for evaluation of shaky feeling and sweating that began this morning.  She has been working to transition from xanax to buspar for her anxiety.  Previously your taking 0.5mg  three times a day.  She reports she started Buspar and then did not take any furter xanax.   She did not taper this  medication.   She also has not felt like eating but denies any nausea or vomiting.  She reports she feels anxious feeling but not as though she is having a panic attack which she has experienced in the past.      She has taken paxil in the past.  Reports she has been taking Cymbalta for many many years and has increased her dose to 60mg .  She reports she has discussed changing medications.  We dicussed tapering medications wen starting new.  Also discussed there can be an increase in anxiety while doing this and she dose not want to to this right now.  Close follow up on her mood and medications 2 weeks.    Past Medical History:  Diagnosis Date   Anxiety    occasional   Arthritis    Depression    Hyperlipidemia    Hypertension    Hypothyroid    hypo thyroid   Osteopenia    Urinary tract infection 12/15/2011   once   Vitamin D deficiency      Allergies  Allergen Reactions   Oxycodone     "Found Oxycodone not effective for pain control" with last RTKA   Other     Opiods,causes severe anxiety    Current Outpatient Medications on File Prior to Visit  Medication Sig   ALPRAZolam (XANAX) 0.5 MG tablet Take 1/2-1 tab up to three times in a day for severe anxiety or panic attacks. Avoid taking daily due to addictive nature, limit to <5 days/week   busPIRone (BUSPAR) 5 MG tablet Start taking 1 tab daily in the morning for anxiety; slowly increase up to three times daily if needed.   Cholecalciferol (VITAMIN D3) 5000 units CAPS Take by mouth.   DULoxetine (CYMBALTA)  60 MG capsule Take 1 capsule Daily for Mood.   Methylcobalamin (B-12 FAST DISSOLVE SL) Place under the tongue daily.   Multiple Vitamin (MULTIVITAMIN) tablet Take 1 tablet by mouth daily.   raloxifene (EVISTA) 60 MG tablet Take 1 tablet Daily for Osteoporosis   No current facility-administered medications on file prior to visit.    ROS: all negative except above.   Physical Exam:  BP 122/80    Pulse  (!) 102    Temp 97.7 F (36.5 C)    Wt 160 lb (72.6 kg)    SpO2 99%    BMI 31.78 kg/m   General Appearance: Well nourished, in no apparent distress. Eyes: PERRLA, EOMs, conjunctiva no swelling or erythema Sinuses: No Frontal/maxillary tenderness ENT/Mouth: Ext aud canals clear, TMs without erythema, bulging. No erythema, swelling, or exudate on post pharynx.  Tonsils not swollen or erythematous. Hearing normal.  Neck: Supple, thyroid normal.  Respiratory: Respiratory effort normal, BS equal bilaterally without rales, rhonchi, wheezing or stridor.  Cardio: RRR with no MRGs. Brisk peripheral pulses without edema.  Abdomen: Soft, + BS.  Non tender, no guarding, rebound, hernias, masses. Lymphatics: Non tender without lymphadenopathy.  Musculoskeletal: Full ROM, 5/5 strength, normal gait.  Skin: Warm, dry without rashes, lesions, ecchymosis.  Neuro: Cranial nerves intact. Normal muscle tone, no cerebellar symptoms. Sensation intact.  Psych: Awake and oriented X 3, normal affect, Insight and Judgment appropriate.     Garnet Sierras, NP 10:42 AM [facility]

## 2020-02-20 ENCOUNTER — Encounter: Payer: Self-pay | Admitting: Internal Medicine

## 2020-02-20 ENCOUNTER — Other Ambulatory Visit: Payer: Self-pay | Admitting: Internal Medicine

## 2020-02-20 ENCOUNTER — Ambulatory Visit (INDEPENDENT_AMBULATORY_CARE_PROVIDER_SITE_OTHER): Payer: Medicare Other | Admitting: Internal Medicine

## 2020-02-20 VITALS — BP 118/80 | HR 75 | Ht 59.5 in | Wt 159.0 lb

## 2020-02-20 DIAGNOSIS — E038 Other specified hypothyroidism: Secondary | ICD-10-CM

## 2020-02-20 DIAGNOSIS — E063 Autoimmune thyroiditis: Secondary | ICD-10-CM | POA: Diagnosis not present

## 2020-02-20 MED ORDER — ARMOUR THYROID 90 MG PO TABS: 45.0000 mg | ORAL_TABLET | Freq: Every day | ORAL | 3 refills | Status: DC

## 2020-02-20 NOTE — Telephone Encounter (Signed)
No pb. She is not on it, but on Armour.

## 2020-02-20 NOTE — Progress Notes (Signed)
Patient ID: Michelle Miranda, female   DOB: 08-Apr-1951, 69 y.o.   MRN: [id number]   This visit occurred during the SARS-CoV-2 public health emergency.  Safety protocols were in place, including screening questions prior to the visit, additional usage of staff PPE, and extensive cleaning of exam room while observing appropriate contact time as indicated for disinfecting solutions.   HPI  Michelle Miranda is a 69 y.o.-year-old female, initially referred by her PCP, Michelle Mutters, NP (Dr. Melford Aase), returning for follow-up for Hashimoto's hypothyroidism. Last visit 6 months ago.  Her husband died expectantly 1 mo ago of an aggressive cancer.  In the past, she had multiple complaints including hair loss, dry skin, weight gain, heat intolerance/increased sweating, and itching, enlarged tongue, significant fatigue.  We changed from levothyroxine to Armour and she is feeling better on this only with some fatigue, and increased sweating.  Reviewed and addended history: Pt. has been dx with hypothyroidism in 2012 and Hashimoto's thyroiditis in 04/2016 >> prev. On Levothyroxine, then on Synthroid d.a.w. 37.5 mcg 6/7 and 75 mcg 1/7 (changed in 01/2016).  Latest thyroid dose change was to 50 mcg daily and she has maintained normal TFTs afterwards.    We changed from levothyroxine to Armour 01/2019.  She feels much better on Armour, with less fatigue.  She takes Armour 45 mg daily: - in am (around 4:30 to 5 AM, then goes back to bed) - fasting - at least 30 min from b'fast - no Ca, Fe,  PPIs, + MVI more than 4 hours later - On 5000 mcg biotin daily-last dose >2 weeks ago  She was previously on selenium 200 mcg daily, but now off.  Reviewed her TFTs: Lab Results  Component Value Date   TSH 2.06 02/11/2020   TSH 1.71 10/17/2019   TSH 3.29 08/23/2019   TSH 2.50 06/13/2019   TSH 2.77 02/26/2019   TSH 2.33 10/04/2018   TSH 1.32 02/10/2018   TSH 1.62 02/10/2017   TSH 2.08 07/06/2016   TSH  2.02 05/18/2016   FREET4 0.70 08/23/2019   FREET4 0.61 06/13/2019   FREET4 0.76 02/26/2019   FREET4 0.90 02/10/2018   FREET4 0.74 02/10/2017   FREET4 0.70 05/18/2016   FREET4 0.79 04/09/2016    She has a history of Hashimoto thyroiditis based on elevated TPO antibodies: Component     Latest Ref Rng & Units 04/09/2016  Thyroperoxidase Ab SerPl-aCnc     <9 IU/mL 9 (H)  Thyroglobulin Ab     <2 IU/mL <1   Pt denies: - feeling nodules in neck - hoarseness - dysphagia - SOB with lying down But she has occasional choking, which is chronic.  She has + FH of thyroid disorders in: mother - hypothyroid. No FH of thyroid cancer. No h/o radiation tx to head or neck.  No seaweed or kelp. No recent contrast studies. + herbal supplements (L theanine). On Magnesium. No recent steroids use.   ROS: Constitutional: no weight gain/+ weight loss (partly intentional, also grief), no fatigue, + subjective hyperthermia, or hot flashes), no subjective hypothermia Eyes: no blurry vision, no xerophthalmia ENT: no sore throat, + see HPI Cardiovascular: no CP/no SOB/no palpitations/no leg swelling Respiratory: no cough/no SOB/no wheezing Gastrointestinal: no N/no V/no D/no C/no acid reflux Musculoskeletal: no muscle aches/no joint aches Skin: no rashes, no hair loss Neurological: + tremors/no numbness/no tingling/no dizziness  I reviewed pt's medications, allergies, PMH, social hx, family hx, and changes were documented in the history of present illness. Otherwise,  unchanged from my initial visit note.  Past Medical History:  Diagnosis Date  . Anxiety    occasional  . Arthritis   . Depression   . Hyperlipidemia   . Hypertension   . Hypothyroid    hypo thyroid  . Osteopenia   . Urinary tract infection 12/15/2011   once  . Vitamin D deficiency    Past Surgical History:  Procedure Laterality Date  . c sections  jan 1980 and march 1984  . CATARACT EXTRACTION, BILATERAL Bilateral 2016   Dr.  Bing Plume  . COLONOSCOPY    . ovarian cyst with lararoscopy march 1998  1998  . TOTAL KNEE ARTHROPLASTY  01/03/2012   Procedure: TOTAL KNEE ARTHROPLASTY;  Surgeon: Gearlean Alf, MD;  Location: [facility];  Service: Orthopedics;  Laterality: Right;  . TOTAL KNEE ARTHROPLASTY Left 09/11/2012   Procedure: Left Total Knee Arthroplasty;  Surgeon: Gearlean Alf, MD;  Location: [facility];  Service: Orthopedics;  Laterality: Left;  . WISDOM TOOTH EXTRACTION  09-05-12   extractions   Social History   Social History  . Marital status: Married    Spouse name: N/A  . Number of children: 2   Occupational History  . Law office admin   Social History Main Topics  . Smoking status: Never Smoker  . Smokeless tobacco: Never Used  . Alcohol use Yes     Comment: 1-2 glass wine night  . Drug use: No   Current Outpatient Medications on File Prior to Visit  Medication Sig Dispense Refill  . ALPRAZolam (XANAX) 0.5 MG tablet Take 1/2-1 tab up to three times in a day for severe anxiety or panic attacks. Avoid taking daily due to addictive nature, limit to <5 days/week 60 tablet 0  . ARMOUR THYROID 90 MG tablet Take 0.5 tablets (45 mg total) by mouth daily. 60 tablet 2  . busPIRone (BUSPAR) 5 MG tablet Start taking 1 tab daily in the morning for anxiety; slowly increase up to three times daily if needed. 90 tablet 0  . Cholecalciferol (VITAMIN D3) 5000 units CAPS Take by mouth.    . DULoxetine (CYMBALTA) 60 MG capsule Take 1 capsule Daily for Mood. 90 capsule 0  . Methylcobalamin (B-12 FAST DISSOLVE SL) Place under the tongue daily.    . Multiple Vitamin (MULTIVITAMIN) tablet Take 1 tablet by mouth daily.    . raloxifene (EVISTA) 60 MG tablet Take 1 tablet Daily for Osteoporosis 90 tablet 3  . BIOTIN PO Take 1 tablet by mouth daily. (Patient not taking: Reported on 02/11/2020)    . hydrOXYzine (ATARAX/VISTARIL) 25 MG tablet Take 0.5-1 tablets (12.5-25 mg total) by mouth every 8 (eight) hours as needed for anxiety.  (Patient not taking: Reported on 02/20/2020) 90 tablet 2   No current facility-administered medications on file prior to visit.   Allergies  Allergen Reactions  . Oxycodone     "Found Oxycodone not effective for pain control" with last RTKA  . Other     Opiods,causes severe anxiety   Family History  Problem Relation Age of Onset  . Hypertension Mother   . Thyroid disease Mother   . Arthritis Mother   . Cancer Mother        gallbladder cancer  . COPD Father   . Breast cancer Maternal Grandmother        93s  . Stroke Maternal Grandmother 88  . Breast cancer Paternal Grandmother   . Colon cancer Neg Hx   . Esophageal cancer Neg  Hx   . Stomach cancer Neg Hx   . Rectal cancer Neg Hx    PE: BP 118/80   Pulse 75   Ht 4' 11.5" (1.511 m)   Wt 159 lb (72.1 kg)   SpO2 97%   BMI 31.58 kg/m  Wt Readings from Last 3 Encounters:  02/20/20 159 lb (72.1 kg)  02/19/20 160 lb (72.6 kg)  02/11/20 164 lb 12.8 oz (74.8 kg)   Constitutional: overweight, in NAD Eyes: PERRLA, EOMI, no exophthalmos ENT: moist mucous membranes, no thyromegaly, no cervical lymphadenopathy Cardiovascular: RRR, No MRG Respiratory: CTA B Gastrointestinal: abdomen soft, NT, ND, BS+ Musculoskeletal: no deformities, strength intact in all 4 Skin: moist, warm, no rashes Neurological: + Chronic tremor with outstretched hands, DTR normal in all 4  ASSESSMENT: 1. Hypothyroidism 2/2 Hashimoto's thyroiditis  PLAN:  1. Patient with longstanding Hashimoto's hypothyroidism, on Armour thyroid due to persistent fatigue on levothyroxine.  On Armour, she feels more energetic.  She tolerates it very well. - latest thyroid labs reviewed with pt >> normal few days ago: Lab Results  Component Value Date   TSH 2.06 02/11/2020   - she continues on Armour 45 mg daily - pt feels good on this dose.  She 19 pounds in the last 6 months!!!  She tells me that this was partly intentional, although apparently due to reduced appetite  due to grief from her husband's death.  She also has hot flashes, which are not new. - we discussed about taking the thyroid hormone every day, with water, >30 minutes before breakfast, separated by >4 hours from acid reflux medications, calcium, iron, multivitamins. Pt. is taking it correctly. -I will see her back in 1 year.  Philemon Kingdom, MD PhD [facility]

## 2020-02-20 NOTE — Patient Instructions (Addendum)
Please continue Armour 45 mg daily.  Take the thyroid hormone every day, with water, at least 30 minutes before breakfast, separated by at least 4 hours from: - acid reflux medications - calcium - iron - multivitamins  Please come back for a follow-up appointment in 1 year.

## 2020-02-20 NOTE — Telephone Encounter (Signed)
Please advise. Cytomel not covered by insurance.

## 2020-02-20 NOTE — Telephone Encounter (Signed)
My mistake, Armour is not covered and pharmacy is asking to switch to Cytomel.

## 2020-02-29 ENCOUNTER — Other Ambulatory Visit: Payer: Self-pay | Admitting: Internal Medicine

## 2020-02-29 ENCOUNTER — Other Ambulatory Visit: Payer: Self-pay

## 2020-02-29 DIAGNOSIS — E063 Autoimmune thyroiditis: Secondary | ICD-10-CM

## 2020-02-29 MED ORDER — ARMOUR THYROID 90 MG PO TABS: 45.0000 mg | ORAL_TABLET | Freq: Every day | ORAL | 3 refills | Status: DC

## 2020-02-29 NOTE — Telephone Encounter (Signed)
Given pt confusion about PA process and insurance no longer covering Armour Thyroid, called pt rather than continuing to message back and forth. Wanted to ensure she understood process. Called pt and informed her that sometimes insurance plans choose to cover alternative drugs (such as Cytomel) in the middle of the year, often in July. This is very much true for her insurance. While they may have covered Armour Thyroid in the past, her plan now indicates Cytomel is now covered. Advised that this was not a confusion on behalf of [facility] but rather the reason for [facility] suggesting the NEW covered alternative. In addition, advised that since she and [name] are wanting to continue use of Armour Thyroid, a PA will be required. This very much explains why she received a text stating her Rx is on hold. However, [facility] will need to initiate and fax the request. To expedite, asked is she would prefer to have her Rx sent to an alternate pharmacy. Pt agreed. Refer to Rx sent today re: location pt requested. Advised we will await response from [facility] re: need for PA. Verbalized acceptance and understanding.

## 2020-03-03 ENCOUNTER — Telehealth: Payer: Self-pay

## 2020-03-03 NOTE — Telephone Encounter (Signed)
Received PA request from [facility] for Armour Thyroid but failed to provide the following information in order to proceed with PA:   BIN  PCN  Rx grp  Specific Medicare Rx benefit provider  Returned fax advising that they provide this information so that we may proceed with PA. PA remains on hold pending this information. Confirmation received.

## 2020-03-06 ENCOUNTER — Other Ambulatory Visit: Payer: Self-pay

## 2020-03-06 ENCOUNTER — Ambulatory Visit (INDEPENDENT_AMBULATORY_CARE_PROVIDER_SITE_OTHER): Payer: Medicare Other | Admitting: Adult Health Nurse Practitioner

## 2020-03-06 ENCOUNTER — Encounter: Payer: Self-pay | Admitting: Adult Health Nurse Practitioner

## 2020-03-06 VITALS — BP 110/76 | HR 84 | Temp 97.4°F | Resp 16 | Ht 59.5 in | Wt 156.6 lb

## 2020-03-06 DIAGNOSIS — Z79899 Other long term (current) drug therapy: Secondary | ICD-10-CM

## 2020-03-06 DIAGNOSIS — F419 Anxiety disorder, unspecified: Secondary | ICD-10-CM

## 2020-03-06 DIAGNOSIS — F331 Major depressive disorder, recurrent, moderate: Secondary | ICD-10-CM

## 2020-03-06 DIAGNOSIS — J040 Acute laryngitis: Secondary | ICD-10-CM

## 2020-03-06 LAB — CBC WITH DIFFERENTIAL/PLATELET
Absolute Monocytes: 846 cells/uL (ref 200–950)
Basophils Absolute: 64 cells/uL (ref 0–200)
Basophils Relative: 0.7 %
Eosinophils Absolute: 91 cells/uL (ref 15–500)
Eosinophils Relative: 1 %
HCT: 41.8 % (ref 35.0–45.0)
Hemoglobin: 14.2 g/dL (ref 11.7–15.5)
Lymphs Abs: 1747 cells/uL (ref 850–3900)
MCH: 31 pg (ref 27.0–33.0)
MCHC: 34 g/dL (ref 32.0–36.0)
MCV: 91.3 fL (ref 80.0–100.0)
MPV: 10.1 fL (ref 7.5–12.5)
Monocytes Relative: 9.3 %
Neutro Abs: 6352 cells/uL (ref 1500–7800)
Neutrophils Relative %: 69.8 %
Platelets: 325 10*3/uL (ref 140–400)
RBC: 4.58 10*6/uL (ref 3.80–5.10)
RDW: 12.8 % (ref 11.0–15.0)
Total Lymphocyte: 19.2 %
WBC: 9.1 10*3/uL (ref 3.8–10.8)

## 2020-03-06 LAB — COMPLETE METABOLIC PANEL WITH GFR
AG Ratio: 1.7 (calc) (ref 1.0–2.5)
ALT: 22 U/L (ref 6–29)
AST: 25 U/L (ref 10–35)
Albumin: 4.3 g/dL (ref 3.6–5.1)
Alkaline phosphatase (APISO): 76 U/L (ref 37–153)
BUN: 11 mg/dL (ref 7–25)
CO2: 28 mmol/L (ref 20–32)
Calcium: 9.5 mg/dL (ref 8.6–10.4)
Chloride: 106 mmol/L (ref 98–110)
Creat: 0.81 mg/dL (ref 0.50–0.99)
GFR, Est African American: 87 mL/min/{1.73_m2} (ref >=60)
GFR, Est Non African American: 75 mL/min/{1.73_m2} (ref >=60)
Globulin: 2.6 g/dL (calc) (ref 1.9–3.7)
Glucose, Bld: 112 mg/dL — ABNORMAL HIGH (ref 65–99)
Potassium: 4.9 mmol/L (ref 3.5–5.3)
Sodium: 141 mmol/L (ref 135–146)
Total Bilirubin: 0.6 mg/dL (ref 0.2–1.2)
Total Protein: 6.9 g/dL (ref 6.1–8.1)

## 2020-03-06 MED ORDER — DEXAMETHASONE 0.5 MG PO TABS: ORAL_TABLET | ORAL | 0 refills | Status: DC

## 2020-03-06 NOTE — Patient Instructions (Addendum)
   Zyrtec (cetirazine) you can get this at any pharmacy.  Take one tablet every night as this can make you sleepy.  This is to help dry up fluid to see if this helps with the inflammation of your vocal cords.      If there is no improvement you can pick up a prescription for dexamethasone.  This is a steroid taper pack.  Be sure to take this with food to avoid stomach upset.    You can also try Pepsid (famotidine)  Take this once a day to see if it helps to reduce acid that could be causing the changes in your voice.    As we discussed in the appointment, you may stop the buspar to see if this is causing some of your symptoms.  You may continue to utilize the alprazolam half tablet twice a day as needed for severe anxiety.   If there is no change in the way you feel, then restart the buspar working toward three times a day.   Follow up in 4 weeks or sooner if needed.

## 2020-03-06 NOTE — Progress Notes (Signed)
Assessment and Plan:   Michelle Miranda was seen today for medication management and hoarse.  Diagnoses and all orders for this visit:   Anxiety Depression, major, recurrent moderate (HCC) Continue Cymbalta 60mg  daily. Discussed possibly changing this after decreasing alprazolam STOP buspar to see if this improves current symptoms, IF NOT, restart medication and titrate up to TID. Provided schedule for patient to help with clarity of this Discussed medication and side effects with patient  Laryngitis Discussed warm / cold liquids. Try OTC cetirazine nightly.  If not improvement pick up Rx. -     dexamethasone (DECADRON) 0.5 MG tablet; Take 1 tab 3 x day - 3 days, then 2 x day - 3 days, then 1 tab daily Can also try famotadine OTC, reflux?  Medication management -     CBC with Differential/Platelet -     COMPLETE METABOLIC PANEL WITH GFR     Close follow up in 4 weeks on mood and medication transition.  Contact office with any new or worsening, persistent symptoms.   Further disposition pending results of labs. Discussed med's effects and SE's.   Over 30 minutes of face to face interview, exam, counseling, chart review, and critical decision making was performed.   Future Appointments  Date Time Provider [place]  04/08/2020  2:30 PM Garnet Sierras, NP [facility] None  08/04/2020  3:00 PM Unk Pinto, MD [facility] None  02/19/2021  2:40 PM Philemon Kingdom, MD [facility] None    ------------------------------------------------------------------------------------------------------------------   HPI 69 y.o.female presents for evaluation after medication changes. She is currently taking one half tablet three times a week. She is taking buspar 5mg  one tablet in the morning.  Reports that she continues to feel shaky with fast heart beats at times.  Denies any nausea or vomiting.  She has had her thyroid, TSH, check recently and an appointment with Dr Cruzita Lederer. These are  similar symptoms from last OV.  She is concerned that the buspar is making her feel this way.  She is currently taking this once a day.  She has not increased this medication. She does report she has started counseling and feels like this is helping.  She is very anxious with conversation.  She reports she has a scratchy voice.  She is doing some coughing this productive at time intermittently with yellowish phlem.  The coughing has decrease but her voice has been this way for  About 10days now.  She has not taken any OTC products for this.   Last OV 02/19/20 Evaluation of shaky feeling and sweating that began this morning.  She has been working to transition from xanax to buspar for her anxiety.  Previously your taking 0.5mg  three times a day.  She reports she started Buspar and then did not take any furter xanax.   She did not taper this medication.   She also has not felt like eating but denies any nausea or vomiting.  She reports she feels anxious feeling but not as though she is having a panic attack which she has experienced in the past.      She has taken paxil in the past.  Reports she has been taking Cymbalta for many many years and has increased her dose to 60mg .  She reports she has discussed changing medications.  We dicussed tapering medications wen starting new.  Also discussed there can be an increase in anxiety while doing this and she dose not want to to this right now.  Close follow up on her  mood and medications 2 weeks.    Past Medical History:  Diagnosis Date  . Anxiety    occasional  . Arthritis   . Depression   . Hyperlipidemia   . Hypertension   . Hypothyroid    hypo thyroid  . Osteopenia   . Urinary tract infection 12/15/2011   once  . Vitamin D deficiency      Allergies  Allergen Reactions  . Oxycodone     "Found Oxycodone not effective for pain control" with last RTKA  . Other     Opiods,causes severe anxiety    Current Outpatient Medications on File  Prior to Visit  Medication Sig  . ALPRAZolam (XANAX) 0.5 MG tablet Take 1/2-1 tab up to three times in a day for severe anxiety or panic attacks. Avoid taking daily due to addictive nature, limit to <5 days/week  . ARMOUR THYROID 90 MG tablet Take 0.5 tablets (45 mg total) by mouth daily. ALTERNATIVE WILL NOT BE SENT. PLEASE FILL RX ASAP  . Cholecalciferol (VITAMIN D3) 5000 units CAPS Take by mouth.  . Methylcobalamin (B-12 FAST DISSOLVE SL) Place under the tongue daily.  . Multiple Vitamin (MULTIVITAMIN) tablet Take 1 tablet by mouth daily.  . raloxifene (EVISTA) 60 MG tablet TAKE 1 TABLET DAILY FOR OSTEOPOROSIS   No current facility-administered medications on file prior to visit.    ROS: all negative except above.   Physical Exam:  BP 110/76   Pulse 84   Temp (!) 97.4 F (36.3 C)   Resp 16   Ht 4' 11.5" (1.511 m)   Wt 156 lb 9.6 oz (71 kg)   BMI 31.10 kg/m   General Appearance: Well nourished, in no apparent distress. Eyes: PERRLA, EOMs, conjunctiva no swelling or erythema Sinuses: No Frontal/maxillary tenderness ENT/Mouth: Ext aud canals clear, TMs without erythema, bulging. No erythema, swelling, or exudate on post pharynx.  Tonsils not swollen or erythematous. Hearing normal.  Neck: Supple, thyroid normal.  Respiratory: Respiratory effort normal, BS equal bilaterally without rales, rhonchi, wheezing or stridor.  Cardio: RRR with no MRGs. Brisk peripheral pulses without edema.  Abdomen: Soft, + BS.  Non tender, no guarding, rebound, hernias, masses. Lymphatics: Non tender without lymphadenopathy.  Musculoskeletal: Full ROM, 5/5 strength, normal gait.  Skin: Warm, dry without rashes, lesions, ecchymosis.  Neuro: Cranial nerves intact. Normal muscle tone, no cerebellar symptoms. Sensation intact.  Psych: Awake and oriented X 3, anxious with conversation, Insight and Judgment appropriate.     Garnet Sierras, NP 1:42 PM [facility]

## 2020-03-08 ENCOUNTER — Other Ambulatory Visit: Payer: Self-pay | Admitting: Adult Health

## 2020-03-09 ENCOUNTER — Other Ambulatory Visit: Payer: Self-pay | Admitting: Internal Medicine

## 2020-03-09 DIAGNOSIS — F325 Major depressive disorder, single episode, in full remission: Secondary | ICD-10-CM

## 2020-03-27 ENCOUNTER — Ambulatory Visit (INDEPENDENT_AMBULATORY_CARE_PROVIDER_SITE_OTHER): Payer: Medicare Other | Admitting: Adult Health

## 2020-03-27 ENCOUNTER — Other Ambulatory Visit: Payer: Self-pay

## 2020-03-27 ENCOUNTER — Encounter: Payer: Self-pay | Admitting: Adult Health

## 2020-03-27 VITALS — BP 126/72 | HR 79 | Temp 97.5°F | Wt 152.0 lb

## 2020-03-27 DIAGNOSIS — F419 Anxiety disorder, unspecified: Secondary | ICD-10-CM | POA: Diagnosis not present

## 2020-03-27 DIAGNOSIS — F331 Major depressive disorder, recurrent, moderate: Secondary | ICD-10-CM | POA: Diagnosis not present

## 2020-03-27 DIAGNOSIS — F4321 Adjustment disorder with depressed mood: Secondary | ICD-10-CM | POA: Diagnosis not present

## 2020-03-27 MED ORDER — ESCITALOPRAM OXALATE 20 MG PO TABS: ORAL_TABLET | ORAL | 0 refills | Status: DC

## 2020-03-27 MED ORDER — DULOXETINE HCL 30 MG PO CPEP: ORAL_CAPSULE | ORAL | 0 refills | Status: DC

## 2020-03-27 NOTE — Patient Instructions (Signed)
   Please be aware that some of the medications that you are on can sometimes cause a rare and potentially dangerous adverse reaction, called SEROTONIN SYNDROME: Symptoms of this condition include (but are not limited to):  Agitation or restlessness, confusion, rapid heart rate and high blood pressure, dilated pupils, loss of muscle coordination or twitching muscles, muscle rigidity/stiffness, sweating and/or flushing, diarrhea, headache, shivering, goose bumps. If you have any of these symptoms you may have to stop the medication. Call your health care provider immediately.  Severe serotonin syndrome can be life-threatening emergency. Signs and symptoms of a severe reaction may include: high fever, seizures, irregular heartbeat, unconsciousness or altered level of awareness or personality changes.  If you have any of these new symptoms, call 911 or have someone take you to the emergency room.      Escitalopram tablets What is this medicine? ESCITALOPRAM (es sye TAL oh pram) is used to treat depression and certain types of anxiety. This medicine may be used for other purposes; ask your health care provider or pharmacist if you have questions. COMMON BRAND NAME(S): Lexapro What should I tell my health care provider before I take this medicine? They need to know if you have any of these conditions:  bipolar disorder or a family history of bipolar disorder  diabetes  glaucoma  heart disease  kidney or liver disease  receiving electroconvulsive therapy  seizures (convulsions)  suicidal thoughts, plans, or attempt by you or a family member  an unusual or allergic reaction to escitalopram, the related drug citalopram, other medicines, foods, dyes, or preservatives  pregnant or trying to become pregnant  breast-feeding How should I use this medicine? Take this medicine by mouth with a glass of water. Follow the directions on the prescription label. You can take it with or without  food. If it upsets your stomach, take it with food. Take your medicine at regular intervals. Do not take it more often than directed. Do not stop taking this medicine suddenly except upon the advice of your doctor. Stopping this medicine too quickly may cause serious side effects or your condition may worsen. A special MedGuide will be given to you by the pharmacist with each prescription and refill. Be sure to read this information carefully each time. Talk to your pediatrician regarding the use of this medicine in children. Special care may be needed. Overdosage: If you think you have taken too much of this medicine contact a poison control center or emergency room at once. NOTE: This medicine is only for you. Do not share this medicine with others. What if I miss a dose? If you miss a dose, take it as soon as you can. If it is almost time for your next dose, take only that dose. Do not take double or extra doses. What may interact with this medicine? Do not take this medicine with any of the following medications:  certain medicines for fungal infections like fluconazole, itraconazole, ketoconazole, posaconazole, voriconazole  cisapride  citalopram  dronedarone  linezolid  MAOIs like Carbex, Eldepryl, Marplan, Nardil, and Parnate  methylene blue (injected into a vein)  pimozide  thioridazine This medicine may also interact with the following medications:  alcohol  amphetamines  aspirin and aspirin-like medicines  carbamazepine  certain medicines for depression, anxiety, or psychotic disturbances  certain medicines for migraine headache like almotriptan, eletriptan, frovatriptan, naratriptan, rizatriptan, sumatriptan, zolmitriptan  certain medicines for sleep  certain medicines that treat or prevent blood clots like warfarin, enoxaparin, dalteparin    cimetidine  diuretics  dofetilide  fentanyl  furazolidone  isoniazid  lithium  metoprolol  NSAIDs,  medicines for pain and inflammation, like ibuprofen or naproxen  other medicines that prolong the QT interval (cause an abnormal heart rhythm)  procarbazine  rasagiline  supplements like St. John's wort, kava kava, valerian  tramadol  tryptophan  ziprasidone This list may not describe all possible interactions. Give your health care provider a list of all the medicines, herbs, non-prescription drugs, or dietary supplements you use. Also tell them if you smoke, drink alcohol, or use illegal drugs. Some items may interact with your medicine. What should I watch for while using this medicine? Tell your doctor if your symptoms do not get better or if they get worse. Visit your doctor or health care professional for regular checks on your progress. Because it may take several weeks to see the full effects of this medicine, it is important to continue your treatment as prescribed by your doctor. Patients and their families should watch out for new or worsening thoughts of suicide or depression. Also watch out for sudden changes in feelings such as feeling anxious, agitated, panicky, irritable, hostile, aggressive, impulsive, severely restless, overly excited and hyperactive, or not being able to sleep. If this happens, especially at the beginning of treatment or after a change in dose, call your health care professional. You may get drowsy or dizzy. Do not drive, use machinery, or do anything that needs mental alertness until you know how this medicine affects you. Do not stand or sit up quickly, especially if you are an older patient. This reduces the risk of dizzy or fainting spells. Alcohol may interfere with the effect of this medicine. Avoid alcoholic drinks. Your mouth may get dry. Chewing sugarless gum or sucking hard candy, and drinking plenty of water may help. Contact your doctor if the problem does not go away or is severe. What side effects may I notice from receiving this medicine? Side  effects that you should report to your doctor or health care professional as soon as possible:  allergic reactions like skin rash, itching or hives, swelling of the face, lips, or tongue  anxious  black, tarry stools  changes in vision  confusion  elevated mood, decreased need for sleep, racing thoughts, impulsive behavior  eye pain  fast, irregular heartbeat  feeling faint or lightheaded, falls  feeling agitated, angry, or irritable  hallucination, loss of contact with reality  loss of balance or coordination  loss of memory  painful or prolonged erections  restlessness, pacing, inability to keep still  seizures  stiff muscles  suicidal thoughts or other mood changes  trouble sleeping  unusual bleeding or bruising  unusually weak or tired  vomiting Side effects that usually do not require medical attention (report to your doctor or health care professional if they continue or are bothersome):  changes in appetite  change in sex drive or performance  headache  increased sweating  indigestion, nausea  tremors This list may not describe all possible side effects. Call your doctor for medical advice about side effects. You may report side effects to FDA at 1-800-FDA-1088. Where should I keep my medicine? Keep out of reach of children. Store at room temperature between 15 and 30 degrees C (59 and 86 degrees F). Throw away any unused medicine after the expiration date. NOTE: This sheet is a summary. It may not cover all possible information. If you have questions about this medicine, talk to your doctor,   pharmacist, or health care provider.  2020 Elsevier/Gold Standard (2018-07-10 11:21:44)  

## 2020-03-27 NOTE — Progress Notes (Signed)
Assessment and Plan:  Allanah was seen today for medication management.  Diagnoses and all orders for this visit:  Moderate episode of recurrent major depressive disorder (HCC) Anxiety Grief reaction Will do 2 week taper down off cymbalta and start lexapro  Cymbalta 30 mg daily and lexapro 10 mg daily x 2 weeks; do NOT take buspar Then stop cymbalta and increase lexapro to 20 mg daily, continue for 8-12 weeks Follow up in 2-3 months for follow up, sooner if any conerns or SE May cautiously retry low dose buspar if needed after fully off of cymbalta Did discuss risk of SS and information given Stress management techniques discussed, increase water, good sleep hygiene discussed, increase exercise, and increase veggies.  -     escitalopram (LEXAPRO) 20 MG tablet; Take 1/2 tab for 2 weeks while tapering off of Cymbalta, then increase to taking full tab once daily. -     DULoxetine (CYMBALTA) 30 MG capsule; Take 1 cap daily for 2 weeks as taper to get off of Cymbalta and start lexapro.   Further disposition pending results of labs. Discussed med's effects and SE's.   Over 30 minutes of exam, counseling, chart review, and critical decision making was performed.   Future Appointments  Date Time Provider [place]  05/27/2020  3:30 PM Liane Comber, NP [facility] None  08/04/2020  3:00 PM Unk Pinto, MD [facility] None  02/19/2021  2:40 PM Philemon Kingdom, MD [facility] None    ------------------------------------------------------------------------------------------------------------------   HPI BP 126/72   Pulse 79   Temp (!) 97.5 F (36.4 C)   Wt 152 lb (68.9 kg)   SpO2 98%   BMI 30.19 kg/m   69 y.o.female presents for med management for anxiety.   Husband was diagnosed with ? cholangiocarcinoma with liver mets but with rapid progression in just a few weeks and passed June 24th.   Hx of major depression has been in remission for many years cymbalta 60 mg daily  until recently. Was reporting panic attacks, xanax was refilled, patient was needing to take TID, so buspar was added in attempt to reduce frequency of benzo use but not well tolerated. She discussed with pharmacist and wants to try transition to SSRI. Doesn't believe she's ever tried this. She is starting grief counseling via [facility], has upcoming appointment.   She is on thyroid medication. Levels have been recently checked by [name] and were normal. Also has had recent normal CBC, CMP on 03/06/2020. Lab Results  Component Value Date   TSH 2.06 02/11/2020    Past Medical History:  Diagnosis Date  . Anxiety    occasional  . Arthritis   . Depression   . Hyperlipidemia   . Hypertension   . Hypothyroid    hypo thyroid  . Osteopenia   . Urinary tract infection 12/15/2011   once  . Vitamin D deficiency      Allergies  Allergen Reactions  . Oxycodone     "Found Oxycodone not effective for pain control" with last RTKA  . Other     Opiods,causes severe anxiety    Current Outpatient Medications on File Prior to Visit  Medication Sig  . ALPRAZolam (XANAX) 0.5 MG tablet Take 1/2-1 tab up to three times in a day for severe anxiety or panic attacks. Avoid taking daily due to addictive nature, limit to <5 days/week (Patient taking differently: as needed. Take 1/2-1 tab up to three times in a day for severe anxiety or panic attacks. Avoid taking daily  due to addictive nature, limit to <5 days/week)  . ARMOUR THYROID 90 MG tablet Take 0.5 tablets (45 mg total) by mouth daily. ALTERNATIVE WILL NOT BE SENT. PLEASE FILL RX ASAP  . Cholecalciferol (VITAMIN D3) 5000 units CAPS Take by mouth.  . Methylcobalamin (B-12 FAST DISSOLVE SL) Place under the tongue daily.  . Multiple Vitamin (MULTIVITAMIN) tablet Take 1 tablet by mouth daily.  . raloxifene (EVISTA) 60 MG tablet TAKE 1 TABLET DAILY FOR OSTEOPOROSIS  . busPIRone (BUSPAR) 5 MG tablet Take 1 tablet 3 x /day for Anxiety (Patient not taking:  Reported on 03/27/2020)   No current facility-administered medications on file prior to visit.    ROS: all negative except above.   Physical Exam:  BP 126/72   Pulse 79   Temp (!) 97.5 F (36.4 C)   Wt 152 lb (68.9 kg)   SpO2 98%   BMI 30.19 kg/m   General Appearance: Well nourished, in no apparent distress. Eyes: PERRLA, conjunctiva no swelling or erythema ENT/Mouth: Mask in place; Hearing normal.  Neck: Supple, thyroid normal.  Respiratory: Respiratory effort normal, BS equal bilaterally without rales, rhonchi, wheezing or stridor.  Cardio: RRR with no MRGs. Brisk peripheral pulses without edema.  Abdomen: Soft, + BS.  Non tender, no guarding. Lymphatics: Non tender without lymphadenopathy.  Musculoskeletal: normal gait.  Skin: Warm, dry without rashes, lesions, ecchymosis.  Neuro: Cranial nerves intact. Normal muscle tone, Sensation intact.  Psych: Awake and oriented X 3, mildly anxious affect, Insight and Judgment appropriate.     Izora Ribas, NP 5:18 PM [facility]

## 2020-04-08 ENCOUNTER — Ambulatory Visit: Payer: Medicare Other | Admitting: Adult Health Nurse Practitioner

## 2020-04-14 ENCOUNTER — Encounter: Payer: Medicare Other | Admitting: Internal Medicine

## 2020-04-18 ENCOUNTER — Encounter: Payer: Self-pay | Admitting: Internal Medicine

## 2020-05-07 ENCOUNTER — Other Ambulatory Visit: Payer: Self-pay | Admitting: Internal Medicine

## 2020-05-07 ENCOUNTER — Other Ambulatory Visit: Payer: Self-pay

## 2020-05-07 ENCOUNTER — Telehealth: Payer: Self-pay | Admitting: Internal Medicine

## 2020-05-07 ENCOUNTER — Other Ambulatory Visit (INDEPENDENT_AMBULATORY_CARE_PROVIDER_SITE_OTHER): Payer: Medicare Other

## 2020-05-07 ENCOUNTER — Encounter: Payer: Self-pay | Admitting: Internal Medicine

## 2020-05-07 DIAGNOSIS — E038 Other specified hypothyroidism: Secondary | ICD-10-CM

## 2020-05-07 DIAGNOSIS — E063 Autoimmune thyroiditis: Secondary | ICD-10-CM | POA: Diagnosis not present

## 2020-05-07 LAB — T4, FREE: Free T4: 0.77 ng/dL (ref 0.60–1.60)

## 2020-05-07 LAB — TSH: TSH: 1.4 u[IU]/mL (ref 0.35–4.50)

## 2020-05-07 LAB — T3, FREE: T3, Free: 3.5 pg/mL (ref 2.3–4.2)

## 2020-05-07 NOTE — Telephone Encounter (Signed)
Patient called stating she's been loosing weight, she does not know if the dose for her thyroid medication would need to be changed. She asked if she could come in and have labs done - I will schedule her now.

## 2020-05-07 NOTE — Telephone Encounter (Signed)
OK to come for labs - they are in

## 2020-05-10 NOTE — Telephone Encounter (Signed)
Mychart message sent to patient to schedule lab appointment.

## 2020-05-21 ENCOUNTER — Emergency Department (HOSPITAL_COMMUNITY)
Admission: EM | Admit: 2020-05-21 | Discharge: 2020-05-22 | Disposition: A | Discharge status: Home / Self Care | Payer: Medicare Other | Attending: Emergency Medicine | Admitting: Emergency Medicine

## 2020-05-21 ENCOUNTER — Emergency Department (HOSPITAL_COMMUNITY): Payer: Medicare Other

## 2020-05-21 ENCOUNTER — Encounter (HOSPITAL_COMMUNITY): Payer: Self-pay | Admitting: Emergency Medicine

## 2020-05-21 ENCOUNTER — Other Ambulatory Visit: Payer: Self-pay

## 2020-05-21 DIAGNOSIS — Z79899 Other long term (current) drug therapy: Secondary | ICD-10-CM | POA: Insufficient documentation

## 2020-05-21 DIAGNOSIS — I10 Essential (primary) hypertension: Secondary | ICD-10-CM | POA: Insufficient documentation

## 2020-05-21 DIAGNOSIS — E039 Hypothyroidism, unspecified: Secondary | ICD-10-CM | POA: Diagnosis not present

## 2020-05-21 DIAGNOSIS — R443 Hallucinations, unspecified: Secondary | ICD-10-CM | POA: Diagnosis not present

## 2020-05-21 DIAGNOSIS — R404 Transient alteration of awareness: Secondary | ICD-10-CM | POA: Insufficient documentation

## 2020-05-21 DIAGNOSIS — R410 Disorientation, unspecified: Secondary | ICD-10-CM | POA: Diagnosis not present

## 2020-05-21 DIAGNOSIS — Z96652 Presence of left artificial knee joint: Secondary | ICD-10-CM | POA: Insufficient documentation

## 2020-05-21 LAB — COMPREHENSIVE METABOLIC PANEL
ALT: 28 U/L (ref 0–44)
AST: 29 U/L (ref 15–41)
Albumin: 4 g/dL (ref 3.5–5.0)
Alkaline Phosphatase: 63 U/L (ref 38–126)
Anion gap: 8 (ref 5–15)
BUN: 11 mg/dL (ref 8–23)
CO2: 24 mmol/L (ref 22–32)
Calcium: 8.6 mg/dL — ABNORMAL LOW (ref 8.9–10.3)
Chloride: 105 mmol/L (ref 98–111)
Creatinine, Ser: 0.71 mg/dL (ref 0.44–1.00)
GFR, Estimated: >60 mL/min (ref >60)
Glucose, Bld: 108 mg/dL — ABNORMAL HIGH (ref 70–99)
Potassium: 3.6 mmol/L (ref 3.5–5.1)
Sodium: 137 mmol/L (ref 135–145)
Total Bilirubin: 0.8 mg/dL (ref 0.3–1.2)
Total Protein: 7.3 g/dL (ref 6.5–8.1)

## 2020-05-21 LAB — CBC WITH DIFFERENTIAL/PLATELET
Abs Immature Granulocytes: 0.01 10*3/uL (ref 0.00–0.07)
Basophils Absolute: 0 10*3/uL (ref 0.0–0.1)
Basophils Relative: 1 %
Eosinophils Absolute: 0.1 10*3/uL (ref 0.0–0.5)
Eosinophils Relative: 1 %
HCT: 40.3 % (ref 36.0–46.0)
Hemoglobin: 13.4 g/dL (ref 12.0–15.0)
Immature Granulocytes: 0 %
Lymphocytes Relative: 27 %
Lymphs Abs: 1.7 10*3/uL (ref 0.7–4.0)
MCH: 31.1 pg (ref 26.0–34.0)
MCHC: 33.3 g/dL (ref 30.0–36.0)
MCV: 93.5 fL (ref 80.0–100.0)
Monocytes Absolute: 0.8 10*3/uL (ref 0.1–1.0)
Monocytes Relative: 13 %
Neutro Abs: 3.6 10*3/uL (ref 1.7–7.7)
Neutrophils Relative %: 58 %
Platelets: 312 10*3/uL (ref 150–400)
RBC: 4.31 MIL/uL (ref 3.87–5.11)
RDW: 12.2 % (ref 11.5–15.5)
WBC: 6.2 10*3/uL (ref 4.0–10.5)
nRBC: 0 % (ref 0.0–0.2)

## 2020-05-21 LAB — ETHANOL: Alcohol, Ethyl (B): <10 mg/dL (ref <10)

## 2020-05-21 NOTE — ED Provider Notes (Signed)
[facility] DEPT Provider Note   CSN: [id number] Arrival date & time: 05/21/20  1557     History Chief Complaint  Patient presents with  . Altered Mental Status    Michelle Miranda is a 69 y.o. female.  HPI Patient presents with confusion. She notes that she feels somewhat better now than she did earlier in the day.  However, earlier in the day, approximately 12 hours ago, without clear precipitant the patient had an episode of confusion, disorientation.  There was no focal weakness, though the patient notes during this period she felt weak in general, with sluggishness. There was no pain, vomiting, fall, fever. Patient notes multiple medical issues, but notably, has recently been transitioning from Cymbalta onto Lexapro for mood stabilization.  She is approximately 6 weeks into titrating her Lexapro.  She has completed a taper of Cymbalta.   Past Medical History:  Diagnosis Date  . Anxiety    occasional  . Arthritis   . Depression   . Hyperlipidemia   . Hypertension   . Hypothyroid    hypo thyroid  . Osteopenia   . Urinary tract infection 12/15/2011   once  . Vitamin D deficiency     Patient Active Problem List   Diagnosis Date Noted  . Moderate episode of recurrent major depressive disorder ([place]) 03/27/2020  . Morbid obesity ([place]) 10/17/2019  . Other abnormal glucose 05/19/2015  . Osteopenia 05/19/2015  . Hyperlipidemia   . Hypertension   . Hypothyroidism due to Hashimoto's thyroiditis   . OA (osteoarthritis) of knee 01/03/2012    Past Surgical History:  Procedure Laterality Date  . c sections  jan 1980 and march 1984  . CATARACT EXTRACTION, BILATERAL Bilateral 2016   Dr. Bing Plume  . COLONOSCOPY    . ovarian cyst with lararoscopy march 1998  1998  . TOTAL KNEE ARTHROPLASTY  01/03/2012   Procedure: TOTAL KNEE ARTHROPLASTY;  Surgeon: Gearlean Alf, MD;  Location: [facility];  Service: Orthopedics;  Laterality: Right;  . TOTAL  KNEE ARTHROPLASTY Left 09/11/2012   Procedure: Left Total Knee Arthroplasty;  Surgeon: Gearlean Alf, MD;  Location: [facility];  Service: Orthopedics;  Laterality: Left;  . WISDOM TOOTH EXTRACTION  09-05-12   extractions     OB History   No obstetric history on file.     Family History  Problem Relation Age of Onset  . Hypertension Mother   . Thyroid disease Mother   . Arthritis Mother   . Cancer Mother        gallbladder cancer  . COPD Father   . Breast cancer Maternal Grandmother        36s  . Stroke Maternal Grandmother 78  . Breast cancer Paternal Grandmother   . Colon cancer Neg Hx   . Esophageal cancer Neg Hx   . Stomach cancer Neg Hx   . Rectal cancer Neg Hx     Social History   Tobacco Use  . Smoking status: Never Smoker  . Smokeless tobacco: Never Used  Vaping Use  . Vaping Use: Never used  Substance Use Topics  . Alcohol use: Yes    Alcohol/week: 12.0 standard drinks    Types: 12 Glasses of wine per week    Comment: 1-2 glass wine night  . Drug use: No    Home Medications Prior to Admission medications   Medication Sig Start Date End Date Taking? Authorizing Provider  ALPRAZolam Duanne Moron) 0.5 MG tablet Take 1/2-1 tab up to  three times in a day for severe anxiety or panic attacks. Avoid taking daily due to addictive nature, limit to <5 days/week Patient taking differently: as needed. Take 1/2-1 tab up to three times in a day for severe anxiety or panic attacks. Avoid taking daily due to addictive nature, limit to <5 days/week 02/15/20   Liane Comber, NP  ARMOUR THYROID 90 MG tablet Take 0.5 tablets (45 mg total) by mouth daily. ALTERNATIVE WILL NOT BE SENT. PLEASE FILL RX ASAP 02/29/20   Philemon Kingdom, MD  busPIRone (BUSPAR) 5 MG tablet Take 1 tablet 3 x /day for Anxiety Patient not taking: Reported on 03/27/2020 03/09/20   Unk Pinto, MD  Cholecalciferol (VITAMIN D3) 5000 units CAPS Take by mouth.    [provider]  DULoxetine (CYMBALTA) 30  MG capsule Take 1 cap daily for 2 weeks as taper to get off of Cymbalta and start lexapro. 03/27/20   Liane Comber, NP  escitalopram (LEXAPRO) 20 MG tablet Take 1/2 tab for 2 weeks while tapering off of Cymbalta, then increase to taking full tab once daily. 03/27/20   Liane Comber, NP  Methylcobalamin (B-12 FAST DISSOLVE SL) Place under the tongue daily.    [provider]  Multiple Vitamin (MULTIVITAMIN) tablet Take 1 tablet by mouth daily.    [provider]  raloxifene (EVISTA) 60 MG tablet TAKE 1 TABLET DAILY FOR OSTEOPOROSIS 02/29/20   Liane Comber, NP    Allergies    Oxycodone and Other  Review of Systems   Review of Systems  Constitutional:       Per HPI, otherwise negative  HENT:       Per HPI, otherwise negative  Respiratory:       Per HPI, otherwise negative  Cardiovascular:       Per HPI, otherwise negative  Gastrointestinal: Negative for vomiting.  Endocrine:       Negative aside from HPI  Genitourinary:       Neg aside from HPI   Musculoskeletal:       Per HPI, otherwise negative  Skin: Negative.   Neurological: Positive for weakness. Negative for syncope.    Physical Exam Updated Vital Signs BP (!) 160/77   Pulse 66   Temp 98.4 F (36.9 C) (Oral)   Resp 18   Ht 5' (1.524 m)   Wt 63.5 kg   SpO2 100%   BMI 27.34 kg/m   Physical Exam Vitals and nursing note reviewed.  Constitutional:      General: She is not in acute distress.    Appearance: She is well-developed.  HENT:     Head: Normocephalic and atraumatic.  Eyes:     Conjunctiva/sclera: Conjunctivae normal.  Cardiovascular:     Rate and Rhythm: Normal rate and regular rhythm.  Pulmonary:     Effort: Pulmonary effort is normal. No respiratory distress.     Breath sounds: Normal breath sounds. No stridor.  Abdominal:     General: There is no distension.  Skin:    General: Skin is warm and dry.  Neurological:     Mental Status: She is alert and oriented to person,  place, and time.     Cranial Nerves: No cranial nerve deficit.     Motor: No weakness.     Coordination: Coordination normal.  Psychiatric:        Mood and Affect: Mood normal.        Behavior: Behavior normal.     ED Results / Procedures / Treatments  Labs (all labs ordered are listed, but only abnormal results are displayed) Labs Reviewed  COMPREHENSIVE METABOLIC PANEL - Abnormal; Notable for the following components:      Result Value   Glucose, Bld 108 (*)    Calcium 8.6 (*)    All other components within normal limits  ETHANOL  CBC WITH DIFFERENTIAL/PLATELET    Radiology CT Head Wo Contrast  Result Date: 05/21/2020 CLINICAL DATA:  Hallucinations, delirium EXAM: CT HEAD WITHOUT CONTRAST TECHNIQUE: Contiguous axial images were obtained from the base of the skull through the vertex without intravenous contrast. COMPARISON:  None. FINDINGS: Brain: No acute intracranial abnormality. Specifically, no hemorrhage, hydrocephalus, mass lesion, acute infarction, or significant intracranial injury. Vascular: No hyperdense vessel or unexpected calcification. Skull: No acute calvarial abnormality. Sinuses/Orbits: Visualized paranasal sinuses and mastoids clear. Orbital soft tissues unremarkable. Other: None IMPRESSION: Normal study. Electronically Signed   By: Rolm Baptise M.D.   On: 05/21/2020 21:19    Procedures Procedures (including critical care time)  Medications Ordered in ED Medications - No data to display  ED Course  I have reviewed the triage vital signs and the nursing notes.  Pertinent labs & imaging results that were available during my care of the patient were reviewed by me and considered in my medical decision making (see chart for details).  I reviewed the CT scan, agree with interpretation, reviewed the labs, generally unremarkable aside from mild abnormalities.  11:28 PM Patient in no distress, awake, alert, speaking clearly, hemodynamically unremarkable. She  denies dysuria, urinalysis was never sent. We discussed today's CT results, lab results, reassuring, no evidence for intracranial pathology, given the absence of focal neurologic deficits, patient is appropriate for discharge with outpatient neurology follow-up and primary care follow-up Some suspicion for medication effects given her recent initiation of Lexapro.  Less likely TIA, though given this consideration, again, neuro follow-up is reasonable.  Final Clinical Impression(s) / ED Diagnoses Final diagnoses:  Transient alteration of awareness      Carmin Muskrat, MD 05/21/20 2330

## 2020-05-21 NOTE — Discharge Instructions (Signed)
As discussed, today's evaluation has been generally reassuring. However, after your episode of altered mental status it is more that he follow-up both with your physician on Monday and with our neurology colleagues. If you develop similar sensations prior to seeing the physicians, please do not hesitate to return here or to our other emergency departments.

## 2020-05-21 NOTE — ED Triage Notes (Signed)
Pt states that she started lexapro and stopped taking cymbalta 8 weeks ago and has felt odd while acclimating to that but today had some self-admitted confusion and disorientation. At this point pt is neurologically intact. Alert and oriented.

## 2020-05-22 ENCOUNTER — Other Ambulatory Visit: Payer: Self-pay | Admitting: Adult Health

## 2020-05-22 ENCOUNTER — Encounter (INDEPENDENT_AMBULATORY_CARE_PROVIDER_SITE_OTHER): Payer: Medicare Other

## 2020-05-22 DIAGNOSIS — F331 Major depressive disorder, recurrent, moderate: Secondary | ICD-10-CM | POA: Diagnosis not present

## 2020-05-22 DIAGNOSIS — R404 Transient alteration of awareness: Secondary | ICD-10-CM

## 2020-05-22 DIAGNOSIS — F4321 Adjustment disorder with depressed mood: Secondary | ICD-10-CM

## 2020-05-22 MED ORDER — ESCITALOPRAM OXALATE 20 MG PO TABS: ORAL_TABLET | ORAL | 0 refills | Status: DC

## 2020-05-22 MED ORDER — PROPRANOLOL HCL 10 MG PO TABS: 10.0000 mg | ORAL_TABLET | Freq: Three times a day (TID) | ORAL | 1 refills | Status: DC | PRN

## 2020-05-22 NOTE — Telephone Encounter (Signed)
Virtual Visit via Telephone Note  I connected with Michelle Miranda on 05/22/2020 at 1300 by telephone and verified that I am speaking with the correct person using two identifiers.  Location: Patient: home Provider: [facility] office    I discussed the limitations, risks, security and privacy concerns of performing an evaluation and management service by telephone and the availability of in person appointments. I also discussed with the patient that there may be a patient responsible charge related to this service. The patient expressed understanding and agreed to proceed.   History of Present Illness:  69 y.o. female who lost her husband this year, has had recurrent depression/anxiety, grief reaction, despite cymbalta 60 mg which she had been on for many years, 6 weeks ago did taper off and onto lexapro 20 mg daily, was evaluated yesterday in ED for AMS, transient confusion/disorientation. She had unremarkable CT head without contrast, labs including CBC, CMP, ethanol level, also notable for recent normal TSH/T3/T4. She declined urine check. There was concern for possible med reaction due to newly on lexapro, has neuro follow up scheduled. She messaged office requesting telephone visit (unable to come in as watching grandchild) for recommendations for med management in the interim. She has been taking lexapro 20 mg daily, hasn't felt this has helped anxiety much at 6 week mark, but otherwise no ill effects until yesterday. Hasn't taken any today.   She is also prescribed xanax which she is trying to reduce/avoid daily dosing; has been prescribed buspar 5 mg TID PRN but never started taking. She is interested in trying propanolol after recommendation from online chat group.    Allergies:  Allergies  Allergen Reactions  . Oxycodone     "Found Oxycodone not effective for pain control" with last RTKA  . Other     Opiods,causes severe anxiety   Medical History:  has OA (osteoarthritis) of  knee; Hyperlipidemia; Hypertension; Hypothyroidism due to Hashimoto's thyroiditis; Other abnormal glucose; Osteopenia; Morbid obesity ([place]); and Moderate episode of recurrent major depressive disorder ([place]) on their problem list. Surgical History:  She  has a past surgical history that includes c sections (jan 1980 and march 1984); ovarian cyst with lararoscopy march 1998 (1998); Total knee arthroplasty (01/03/2012); Wisdom tooth extraction (09-05-12); Total knee arthroplasty (Left, 09/11/2012); Cataract extraction, bilateral (Bilateral, 2016); and Colonoscopy. Family History:  Herfamily history includes Arthritis in her mother; Breast cancer in her maternal grandmother and paternal grandmother; COPD in her father; Cancer in her mother; Hypertension in her mother; Stroke (age of onset: 82) in her maternal grandmother; Thyroid disease in her mother. Social History:   reports that she has never smoked. She has never used smokeless tobacco. She reports current alcohol use of about 12.0 standard drinks of alcohol per week. She reports that she does not use drugs.    Observations/Objective:  General : Well sounding patient in no apparent distress HEENT: no hoarseness, no cough for duration of visit Lungs: speaks in complete sentences, no audible wheezing, no apparent distress Neurological: alert, oriented x 3 Psychiatric: pleasant, mildly anxious, vocal tremor, judgement appropriate    Assessment and Plan:  Diagnoses and all orders for this visit:  Moderate episode of recurrent major depressive disorder ([place]) Grief reaction Transient alteration of awareness [place] workup in ED yesterday with some concern of possible lexapro reaction; she would like to taper off; discussed 1/2 tab daily x 1 week then stop. Did discuss transient confusion may also be seen with severe depression/anxiety, difficult to say for sure whether  this was truly r/t med or not.  She would like to try propranolol; reviewed BPs,  pulse and last EKG. She may be able to tolerate low dose, but discussed need for close monitoring of BP, pulse. Get BP cuff, start 10 mg daily PM, then if tolerating add in AM, titrate up to TID if tolerating without fatigue, hypotension.  Once off of lexapro completely, if propranolol is insufficient may start slow taper on to buspar.  Will schedule for close follow up in office in 2 weeks Follow up sooner if needed for any concerns  Follow Up Instructions:   I discussed the assessment and treatment plan with the patient. The patient was provided an opportunity to ask questions and all were answered. The patient agreed with the plan and demonstrated an understanding of the instructions.   The patient was advised to call back or seek an in-person evaluation if the symptoms worsen or if the condition fails to improve as anticipated.  I provided 20 minutes of non-face-to-face time during this encounter.   Izora Ribas, NP

## 2020-05-26 ENCOUNTER — Ambulatory Visit: Payer: Medicare Other | Admitting: Adult Health

## 2020-05-27 ENCOUNTER — Ambulatory Visit: Payer: Medicare Other | Admitting: Adult Health

## 2020-05-28 ENCOUNTER — Encounter: Payer: Self-pay | Admitting: Neurology

## 2020-05-28 ENCOUNTER — Other Ambulatory Visit: Payer: Self-pay

## 2020-05-28 ENCOUNTER — Ambulatory Visit (INDEPENDENT_AMBULATORY_CARE_PROVIDER_SITE_OTHER): Payer: Medicare Other | Admitting: Neurology

## 2020-05-28 DIAGNOSIS — R404 Transient alteration of awareness: Secondary | ICD-10-CM

## 2020-05-28 DIAGNOSIS — H35373 Puckering of macula, bilateral: Secondary | ICD-10-CM | POA: Diagnosis not present

## 2020-05-28 DIAGNOSIS — Z961 Presence of intraocular lens: Secondary | ICD-10-CM | POA: Diagnosis not present

## 2020-05-28 DIAGNOSIS — R4182 Altered mental status, unspecified: Secondary | ICD-10-CM | POA: Insufficient documentation

## 2020-05-28 DIAGNOSIS — H43812 Vitreous degeneration, left eye: Secondary | ICD-10-CM | POA: Diagnosis not present

## 2020-05-28 DIAGNOSIS — H31091 Other chorioretinal scars, right eye: Secondary | ICD-10-CM | POA: Diagnosis not present

## 2020-05-28 HISTORY — DX: Altered mental status, unspecified: R41.82

## 2020-05-28 NOTE — Progress Notes (Signed)
Reason for visit: Transient altered mental status  Referring physician: [place]  Michelle Miranda is a 69 y.o. female  History of present illness:  Michelle Miranda is a 69 year old right-handed white female female with a history of some problems with depression in the past.  The patient has been on Cymbalta for a number of years, she lost her husband in May 2021 and felt that the Cymbalta was not adequate for treating her depression.  She was transitioned off of Cymbalta and placed on Lexapro, for several weeks she was taking both medications.  The patient has been on 20 mg of Cymbalta recently.  Since going on Lexapro, she has felt nervous and jittery.  On 21 May 2020, she woke up feeling somewhat confused.  This seemed to improve later in the day.  She was going to donate some of her husband's clothes, on the way to the donation center, she had a persistent felling that she needed to access a certain app on her phone.  She began seeing certain cars that would appear again and again, and she had the feeling that the cars had something to do with the app that she needed to access on her telephone.  She felt somewhat jittery and nervous.  After returning home, she began realizing that these sensations were not normal, she decided to go to the emergency room for evaluation.  The entire episode lasted less than an hour, by the time she got to the emergency room she was feeling better.  A CT scan of the brain was done was unremarkable, blood work was unremarkable.  The patient has had a recent thyroid evaluation that was unremarkable.  The patient denied any headache or focal numbness weakness of the face, arms, legs.  She had no speech or vision changes or changes in balance.  She did not blackout or have any episodes of amnesia.  The patient has since been reduced on the Lexapro dose down to 10 mg daily with plans to stop the medication.  On the 10 mg Lexapro dose she no longer feels nervous or jittery,  she feels at baseline.  She is sent to this office for further evaluation.  Past Medical History:  Diagnosis Date  . Anxiety    occasional  . Arthritis   . Depression   . Hyperlipidemia   . Hypertension   . Hypothyroid    hypo thyroid  . Osteopenia   . Urinary tract infection 12/15/2011   once  . Vitamin D deficiency     Past Surgical History:  Procedure Laterality Date  . c sections  jan 1980 and march 1984  . CATARACT EXTRACTION, BILATERAL Bilateral 2016   Dr. Bing Plume  . COLONOSCOPY    . ovarian cyst with lararoscopy march 1998  1998  . TOTAL KNEE ARTHROPLASTY  01/03/2012   Procedure: TOTAL KNEE ARTHROPLASTY;  Surgeon: Gearlean Alf, MD;  Location: [facility];  Service: Orthopedics;  Laterality: Right;  . TOTAL KNEE ARTHROPLASTY Left 09/11/2012   Procedure: Left Total Knee Arthroplasty;  Surgeon: Gearlean Alf, MD;  Location: [facility];  Service: Orthopedics;  Laterality: Left;  . WISDOM TOOTH EXTRACTION  09-05-12   extractions    Family History  Problem Relation Age of Onset  . Hypertension Mother   . Thyroid disease Mother   . Arthritis Mother   . Cancer Mother        gallbladder cancer  . COPD Father   . Breast cancer Maternal  Grandmother        69s  . Stroke Maternal Grandmother 77  . Breast cancer Paternal Grandmother   . Colon cancer Neg Hx   . Esophageal cancer Neg Hx   . Stomach cancer Neg Hx   . Rectal cancer Neg Hx     Social history:  reports that she has never smoked. She has never used smokeless tobacco. She reports current alcohol use of about 12.0 standard drinks of alcohol per week. She reports that she does not use drugs.  Medications:  Prior to Admission medications   Medication Sig Start Date End Date Taking? Authorizing Provider  ALPRAZolam Duanne Moron) 0.5 MG tablet Take 1/2-1 tab up to three times in a day for severe anxiety or panic attacks. Avoid taking daily due to addictive nature, limit to <5 days/week Patient taking differently: as needed. Take  1/2-1 tab up to three times in a day for severe anxiety or panic attacks. Avoid taking daily due to addictive nature, limit to <5 days/week 02/15/20  Yes Liane Comber, NP  ARMOUR THYROID 90 MG tablet Take 0.5 tablets (45 mg total) by mouth daily. ALTERNATIVE WILL NOT BE SENT. PLEASE FILL RX ASAP 02/29/20  Yes Philemon Kingdom, MD  Cholecalciferol (VITAMIN D3) 5000 units CAPS Take by mouth.   Yes [provider]  escitalopram (LEXAPRO) 20 MG tablet Take 1/2 tab for 1 week then stop. 05/22/20  Yes Liane Comber, NP  Methylcobalamin (B-12 FAST DISSOLVE SL) Place under the tongue daily.   Yes [provider]  Multiple Vitamin (MULTIVITAMIN) tablet Take 1 tablet by mouth daily.   Yes [provider]  propranolol (INDERAL) 10 MG tablet TAKE 1 TABLET(10 MG) BY MOUTH THREE TIMES DAILY AS NEEDED FOR ANXIETY 05/22/20  Yes Liane Comber, NP  raloxifene (EVISTA) 60 MG tablet TAKE 1 TABLET DAILY FOR OSTEOPOROSIS 02/29/20  Yes Liane Comber, NP  busPIRone (BUSPAR) 5 MG tablet Take 1 tablet 3 x /day for Anxiety Patient not taking: Reported on 03/27/2020 03/09/20   Unk Pinto, MD      Allergies  Allergen Reactions  . Oxycodone     "Found Oxycodone not effective for pain control" with last RTKA  . Other     Opiods,causes severe anxiety    ROS:  Out of a complete 14 system review of symptoms, the patient complains only of the following symptoms, and all other reviewed systems are negative.  Transient alteration of awareness Depression  There were no vitals taken for this visit.  Physical Exam  General: The patient is alert and cooperative at the time of the examination.  Eyes: Pupils are equal, round, and reactive to light. Discs are flat bilaterally.  Neck: The neck is supple, no carotid bruits are noted.  Respiratory: The respiratory examination is clear.  Cardiovascular: The cardiovascular examination reveals a regular rate and rhythm, no obvious murmurs  or rubs are noted.  Skin: Extremities are without significant edema.  Neurologic Exam  Mental status: The patient is alert and oriented x 3 at the time of the examination. The patient has apparent normal recent and remote memory, with an apparently normal attention span and concentration ability.  Cranial nerves: Facial symmetry is present. There is good sensation of the face to pinprick and soft touch bilaterally. The strength of the facial muscles and the muscles to head turning and shoulder shrug are normal bilaterally. Speech is well enunciated, no aphasia or dysarthria is noted. Extraocular movements are full. Visual fields are full. The tongue  is midline, and the patient has symmetric elevation of the soft palate. No obvious hearing deficits are noted.  Motor: The motor testing reveals 5 over 5 strength of all 4 extremities. Good symmetric motor tone is noted throughout.  Sensory: Sensory testing is intact to pinprick, soft touch, vibration sensation, and position sense on all 4 extremities. No evidence of extinction is noted.  Coordination: Cerebellar testing reveals good finger-nose-finger and heel-to-shin bilaterally.  Gait and station: Gait is normal. Tandem gait is normal. Romberg is negative. No drift is seen.  Reflexes: Deep tendon reflexes are symmetric and normal bilaterally. Toes are downgoing bilaterally.   CT head 05/21/20:  IMPRESSION: Normal study.  * CT scan images were reviewed online. I agree with the written report.    Assessment/Plan:  1.  Transient alteration of awareness  The patient appears to have had an episode associated with visual hallucinations and delusional thinking.  The patient was nervous and jittery, she has not felt well on the Lexapro since she has been on the medication.  The possibility of a low-grade serotonin syndrome does need to be considered.  The patient is in the process of tapering off the Lexapro which is appropriate.  The patient  will be sent for an EEG study, if this is unremarkable I would not pursue any other further work-up unless the patient has recurring events as described above.  Jill Alexanders MD 05/28/2020 8:44 AM  [facility] [address] [place],  [address]  Phone [phone] Fax [phone]

## 2020-06-04 ENCOUNTER — Ambulatory Visit (INDEPENDENT_AMBULATORY_CARE_PROVIDER_SITE_OTHER): Payer: Medicare Other

## 2020-06-04 ENCOUNTER — Other Ambulatory Visit: Payer: Self-pay

## 2020-06-04 DIAGNOSIS — R404 Transient alteration of awareness: Secondary | ICD-10-CM

## 2020-06-04 DIAGNOSIS — F419 Anxiety disorder, unspecified: Secondary | ICD-10-CM | POA: Insufficient documentation

## 2020-06-04 DIAGNOSIS — R410 Disorientation, unspecified: Secondary | ICD-10-CM

## 2020-06-04 NOTE — Progress Notes (Signed)
Assessment and Plan:  Michelle Miranda was seen today for follow-up.  Diagnoses and all orders for this visit:  Moderate episode of recurrent major depressive disorder (HCC) Anxiety, Grief reaction With recent AMS as per HPI with some question of med reaction or mild SS, pending EEG by Dr. Jannifer Franklin but in agreement for lexapro taper Patient currently taking 10 mg daily, very anxious about tapering too rapidly She is actually doing very well mood wise, best she has been today in the last 6 months After extended discussion, will initiate very slow taper of reducing 1 mg every 2-4 weeks using liquid formulation per her strong preference. Follow up as scheduled or sooner if any concerns Continue with lifestyle changes, exercise, stress management, grief counseling.  -     escitalopram (LEXAPRO) 5 MG/5ML solution; Start taking 10 cc daily instead of oral tab; then in 2-4 weeks reduce to 9 cc for 2 weeks; continue to reduce by 1 cc every 2-4 weeks until you are off of this medication.  Acute rhinosinusitis Mild viral URI, cannot r/o covid 19, recommended drive through testing to r/o  Discussed the importance of avoiding unnecessary antibiotic therapy. Suggested symptomatic OTC remedies. Nasal saline spray for congestion. Nasal steroids, allergy pill, oral steroids offered Follow up as needed if persistent past 10-14 days and will give abx -     predniSONE (DELTASONE) 20 MG tablet; 2 tablets daily for 3 days, 1 tablet daily for 4 days. -     fluticasone (FLONASE) 50 MCG/ACT nasal spray; Place 1-2 sprays into both nostrils at bedtime.  Further disposition pending results of labs. Discussed med's effects and SE's.   Over 30 minutes of exam, counseling, chart review, and critical decision making was performed.   Future Appointments  Date Time Provider [place]  08/04/2020  3:00 PM Unk Pinto, MD [facility] None  08/22/2020  2:30 PM [facility] DX DEXA 1 [facility]  02/19/2021  2:40 PM  Philemon Kingdom, MD [facility] None    ------------------------------------------------------------------------------------------------------------------   HPI BP 112/78   Pulse 83   Temp 97.7 F (36.5 C)   Wt 139 lb (63 kg)   SpO2 95%   BMI 27.15 kg/m   69 y.o.female presents for 2 week follow up on depression/anxiety and BP.   The patient notably lost her husband to cancer abruptly earlier this year, has had recurrent depression/anxiety, grief reaction, severe anxiety and panic attacks on cymbalta 60 mg which she had been on for many years for depression. We discussed and initiated an 8 week taper off and then transition on to lexapro 20 mg daily. After about 4 weeks taking 20 mg of lexapro daily, the patient was evaluated 05/21/2020 in ED for AMS, transient confusion/disorientation. She had unremarkable CT head without contrast, labs including CBC, CMP, ethanol level, also notable for recent normal TSH/T3/T4. She declined urine check. There was concern for possible med reaction due to newly on lexapro, and was advised taper to stop. Note she was fully off of cymbalta for 4 weeks, on lexapro only at time of AMS event. She is also prescribed xanax which she is trying to reduce/avoid daily dosing; had previously been prescribed buspar 5 mg TID PRN but never started taking and has been advised to not take. We discussed a plan to taper off lexapro 10 mg, and per her preference was prescribed propranolol 10 mg TID PRN instead.   She did see Dr. Jannifer Franklin who was in agreement with plan thus far, with a small concern  for possible mild serotonin syndrome, agreed with taper off of lexapro, but also planned to schedule EEG.   Today she presents still taking 10 mg lexapro, never tried/started propranolol, reports has been working on lifestyle and with grief counselor and feels mood is doing better than previous. She would still like to taper off of lexapro but would like to do very slow taper.  Interested in liquid formulation to achieve this.   She reports in last 7 days has had horseness, postnasal drip, nasal congestion, intermittent sinus pressure. Denies HA, fever/chills, mouth pain, ear pressure or . She endorses cough just to clear secretions from throat, denies sore throat. SInus pressure is worse last 3-4 days.   Has been trying essential oil with eucalyptus   She denies watery/itchy eyes, sneezing, hx of allergies. She did have full covid 19 vaccine, no known sick exposures.    Lab Results  Component Value Date   [id number] 093 05/19/2015     Past Medical History:  Diagnosis Date  . Anxiety    occasional  . Arthritis   . Depression   . Hyperlipidemia   . Hypertension   . Hypothyroid    hypo thyroid  . Osteopenia   . Urinary tract infection 12/15/2011   once  . Vitamin D deficiency      Allergies  Allergen Reactions  . Oxycodone     "Found Oxycodone not effective for pain control" with last RTKA  . Other     Opiods,causes severe anxiety    Allergies:  Allergies  Allergen Reactions  . Oxycodone     "Found Oxycodone not effective for pain control" with last RTKA  . Other     Opiods,causes severe anxiety   Family History:  Herfamily history includes Arthritis in her mother; Breast cancer in her maternal grandmother and paternal grandmother; COPD in her father; Cancer in her mother; Hypertension in her mother; Stroke (age of onset: 59) in her maternal grandmother; Thyroid disease in her mother. Social History:   reports that she has never smoked. She has never used smokeless tobacco. She reports current alcohol use of about 12.0 standard drinks of alcohol per week. She reports that she does not use drugs.   Current Outpatient Medications on File Prior to Visit  Medication Sig  . ARMOUR THYROID 90 MG tablet Take 0.5 tablets (45 mg total) by mouth daily. ALTERNATIVE WILL NOT BE SENT. PLEASE FILL RX ASAP  . B Complex Vitamins (B COMPLEX PO) Take by  mouth daily.  . Cholecalciferol (VITAMIN D3) 5000 units CAPS Take by mouth.  . Cyanocobalamin (B12 LIQUID HEALTH BOOSTER PO) Take by mouth daily.  Marland Kitchen MELATONIN PO Take 2 mg by mouth daily as needed.  . Multiple Vitamin (MULTIVITAMIN) tablet Take 1 tablet by mouth daily.  . propranolol (INDERAL) 10 MG tablet TAKE 1 TABLET(10 MG) BY MOUTH THREE TIMES DAILY AS NEEDED FOR ANXIETY  . raloxifene (EVISTA) 60 MG tablet TAKE 1 TABLET DAILY FOR OSTEOPOROSIS  . ALPRAZolam (XANAX) 0.5 MG tablet Take 1/2-1 tab up to three times in a day for severe anxiety or panic attacks. Avoid taking daily due to addictive nature, limit to <5 days/week (Patient not taking: Reported on 06/05/2020)   No current facility-administered medications on file prior to visit.    ROS: all negative except above.   Physical Exam:  BP 112/78   Pulse 83   Temp 97.7 F (36.5 C)   Wt 139 lb (63 kg)   SpO2 95%  BMI 27.15 kg/m   General Appearance: Well nourished, in no apparent distress. Eyes: PERRLA, EOMs, conjunctiva no swelling or erythema Sinuses: No Frontal/maxillary tenderness ENT/Mouth: Ext aud canals clear, TMs without erythema, bulging. No erythema, swelling, or exudate on post pharynx.  Tonsils not swollen or erythematous. Hearing normal. Mildly hoarse vocal quality.  Neck: Supple, thyroid normal.  Respiratory: Respiratory effort normal, BS equal bilaterally without rales, rhonchi, wheezing or stridor.  Cardio: RRR with no MRGs. Brisk peripheral pulses without edema.  Abdomen: Soft, + BS.  Non tender Lymphatics: Non tender without lymphadenopathy.  Musculoskeletal: symmetrical strength, normal gait.  Skin: Warm, dry without rashes, lesions, ecchymosis.  Neuro: Cranial nerves intact. Normal muscle tone, no cerebellar symptoms. Sensation intact.  Psych: Awake and oriented X 3, normal affect, Insight and Judgment appropriate.     Michelle Ribas, NP 5:21 PM [facility]

## 2020-06-05 ENCOUNTER — Encounter: Payer: Self-pay | Admitting: Adult Health

## 2020-06-05 ENCOUNTER — Ambulatory Visit (INDEPENDENT_AMBULATORY_CARE_PROVIDER_SITE_OTHER): Payer: Medicare Other | Admitting: Adult Health

## 2020-06-05 ENCOUNTER — Other Ambulatory Visit: Payer: Self-pay | Admitting: Adult Health

## 2020-06-05 ENCOUNTER — Other Ambulatory Visit: Payer: Self-pay

## 2020-06-05 VITALS — BP 112/78 | HR 83 | Temp 97.7°F | Wt 139.0 lb

## 2020-06-05 DIAGNOSIS — F419 Anxiety disorder, unspecified: Secondary | ICD-10-CM

## 2020-06-05 DIAGNOSIS — J019 Acute sinusitis, unspecified: Secondary | ICD-10-CM

## 2020-06-05 DIAGNOSIS — F4321 Adjustment disorder with depressed mood: Secondary | ICD-10-CM | POA: Diagnosis not present

## 2020-06-05 DIAGNOSIS — F331 Major depressive disorder, recurrent, moderate: Secondary | ICD-10-CM | POA: Diagnosis not present

## 2020-06-05 DIAGNOSIS — I10 Essential (primary) hypertension: Secondary | ICD-10-CM

## 2020-06-05 MED ORDER — FLUTICASONE PROPIONATE 50 MCG/ACT NA SUSP: 1.0000 | Freq: Every day | NASAL | 1 refills | Status: DC

## 2020-06-05 MED ORDER — ESCITALOPRAM OXALATE 5 MG/5ML PO SOLN: ORAL | 3 refills | Status: DC

## 2020-06-05 MED ORDER — PREDNISONE 20 MG PO TABS: ORAL_TABLET | ORAL | 0 refills | Status: DC

## 2020-06-05 NOTE — Patient Instructions (Signed)
    Medicines you can use  Nasal congestion  Little Remedies saline spray (aerosol/mist)- can try this, it is in the kids section - pseudoephedrine (Sudafed)- behind the counter, do not use if you have high blood pressure, medicine that have -D in them.  - phenylephrine (Sudafed PE) -Dextormethorphan + chlorpheniramine (Coridcidin HBP)- okay if you have high blood pressure -Oxymetazoline (Afrin) nasal spray- LIMIT to 3 days -Saline nasal spray -Neti pot (used distilled or bottled water)  Ear pain/congestion  -pseudoephedrine (sudafed) - Nasonex/flonase nasal spray  Fever  -Acetaminophen (Tyelnol) -Ibuprofen (Advil, motrin, aleve)  Sore Throat  -Acetaminophen (Tyelnol) -Ibuprofen (Advil, motrin, aleve) -Drink a lot of water -Gargle with salt water - Rest your voice (don't talk) -Throat sprays -Cough drops  Body Aches  -Acetaminophen (Tyelnol) -Ibuprofen (Advil, motrin, aleve)  Headache  -Acetaminophen (Tyelnol) -Ibuprofen (Advil, motrin, aleve) - Exedrin, Exedrin Migraine  Allergy symptoms (cough, sneeze, runny nose, itchy eyes) -Claritin or loratadine cheapest but likely the weakest  -Zyrtec or certizine at night because it can make you sleepy -The strongest is allegra or fexafinadine  Cheapest at [facility], [employer], [employer]  Cough  -Dextromethorphan (Delsym)- medicine that has DM in it -Guafenesin (Mucinex/Robitussin) - cough drops - drink lots of water  Chest Congestion  -Guafenesin (Mucinex/Robitussin)  Red Itchy Eyes  - Naphcon-A  Upset Stomach  - Bland diet (nothing spicy, greasy, fried, and high acid foods like tomatoes, oranges, berries) -OKAY- cereal, bread, soup, crackers, rice -Eat smaller more frequent meals -reduce caffeine, no alcohol -Loperamide (Imodium-AD) if diarrhea -Prevacid for heart burn  General health when sick  -Hydration -wash your hands frequently -keep surfaces clean -change pillow cases and sheets often -Get fresh air  but do not exercise strenuously -Vitamin D, double up on it - Vitamin C -Zinc       

## 2020-06-06 ENCOUNTER — Other Ambulatory Visit: Payer: Self-pay | Admitting: Internal Medicine

## 2020-06-06 DIAGNOSIS — Z1231 Encounter for screening mammogram for malignant neoplasm of breast: Secondary | ICD-10-CM

## 2020-06-09 ENCOUNTER — Telehealth: Payer: Self-pay | Admitting: Neurology

## 2020-06-09 NOTE — Telephone Encounter (Signed)
I called the patient.  EEG study was normal.  The patient has done much better since coming down on the dose of the Paxil.  Her plans are to discontinue the medication.

## 2020-06-09 NOTE — Procedures (Signed)
    History:  Michelle Miranda is a 69 year old patient with a history of depression.  The patient had been switched to Lexapro and began feeling somewhat jittery.  The patient had episode of mild confusion and delusional thinking.  She has improved after coming down off of the Lexapro dosing.  The patient is being evaluated for this episode.  This is a routine EEG.  No skull defects are noted.  Medications include Armour Thyroid, vitamin D, multivitamins, Inderal, BuSpar, and Evista.  EEG classification: Normal awake  Description of the recording: The background rhythms of this recording consists of a fairly well modulated medium amplitude alpha rhythm of 9 Hz that is reactive to eye opening and closure. As the record progresses, the patient appears to remain in the waking state throughout the recording. Photic stimulation was performed, resulting in a bilateral and symmetric photic driving response. Hyperventilation was also performed, resulting in a minimal buildup of the background rhythm activities without significant slowing seen. At no time during the recording does there appear to be evidence of spike or spike wave discharges or evidence of focal slowing. EKG monitor shows no evidence of cardiac rhythm abnormalities with a heart rate of 90.  Impression: This is a normal EEG recording in the waking state. No evidence of ictal or interictal discharges are seen.

## 2020-06-20 ENCOUNTER — Telehealth: Payer: Self-pay | Admitting: Internal Medicine

## 2020-06-20 ENCOUNTER — Encounter: Payer: Self-pay | Admitting: Internal Medicine

## 2020-06-20 NOTE — Telephone Encounter (Signed)
New message    Patient dog ate her medication ARMOUR THYROID 90 MG tablet has a note from the Vet, willing to pay out pocket, going on vacation tomorrow.   [facility] on [address]

## 2020-06-23 NOTE — Telephone Encounter (Signed)
See my chart message

## 2020-06-25 ENCOUNTER — Encounter: Payer: Medicare Other | Admitting: Internal Medicine

## 2020-07-07 DIAGNOSIS — Z23 Encounter for immunization: Secondary | ICD-10-CM | POA: Diagnosis not present

## 2020-07-11 ENCOUNTER — Other Ambulatory Visit: Payer: Self-pay | Admitting: Adult Health

## 2020-07-11 DIAGNOSIS — F331 Major depressive disorder, recurrent, moderate: Secondary | ICD-10-CM

## 2020-08-04 ENCOUNTER — Encounter: Payer: Medicare Other | Admitting: Internal Medicine

## 2020-08-13 ENCOUNTER — Encounter: Payer: Self-pay | Admitting: Adult Health

## 2020-08-13 ENCOUNTER — Other Ambulatory Visit: Payer: Self-pay

## 2020-08-13 ENCOUNTER — Ambulatory Visit (INDEPENDENT_AMBULATORY_CARE_PROVIDER_SITE_OTHER): Payer: Medicare Other | Admitting: Adult Health

## 2020-08-13 VITALS — BP 110/70 | HR 72 | Temp 97.7°F | Ht 59.0 in | Wt 132.0 lb

## 2020-08-13 DIAGNOSIS — F419 Anxiety disorder, unspecified: Secondary | ICD-10-CM

## 2020-08-13 DIAGNOSIS — Z136 Encounter for screening for cardiovascular disorders: Secondary | ICD-10-CM

## 2020-08-13 DIAGNOSIS — I10 Essential (primary) hypertension: Secondary | ICD-10-CM | POA: Diagnosis not present

## 2020-08-13 DIAGNOSIS — E538 Deficiency of other specified B group vitamins: Secondary | ICD-10-CM

## 2020-08-13 DIAGNOSIS — M170 Bilateral primary osteoarthritis of knee: Secondary | ICD-10-CM | POA: Diagnosis not present

## 2020-08-13 DIAGNOSIS — M8580 Other specified disorders of bone density and structure, unspecified site: Secondary | ICD-10-CM | POA: Diagnosis not present

## 2020-08-13 DIAGNOSIS — Z0001 Encounter for general adult medical examination with abnormal findings: Secondary | ICD-10-CM

## 2020-08-13 DIAGNOSIS — Z6826 Body mass index (BMI) 26.0-26.9, adult: Secondary | ICD-10-CM

## 2020-08-13 DIAGNOSIS — E063 Autoimmune thyroiditis: Secondary | ICD-10-CM | POA: Diagnosis not present

## 2020-08-13 DIAGNOSIS — E038 Other specified hypothyroidism: Secondary | ICD-10-CM | POA: Diagnosis not present

## 2020-08-13 DIAGNOSIS — E559 Vitamin D deficiency, unspecified: Secondary | ICD-10-CM | POA: Diagnosis not present

## 2020-08-13 DIAGNOSIS — R7309 Other abnormal glucose: Secondary | ICD-10-CM | POA: Diagnosis not present

## 2020-08-13 DIAGNOSIS — E785 Hyperlipidemia, unspecified: Secondary | ICD-10-CM | POA: Diagnosis not present

## 2020-08-13 DIAGNOSIS — F331 Major depressive disorder, recurrent, moderate: Secondary | ICD-10-CM

## 2020-08-13 NOTE — Patient Instructions (Addendum)
Ms. Michelle Miranda , Thank you for taking time to come for your Annual Wellness Visit. I appreciate your ongoing commitment to your health goals. Please review the following plan we discussed and let me know if I can assist you in the future.   These are the goals we discussed: Goals    . Blood Pressure < 130/80    . Exercise 150 min/wk Moderate Activity    . Weight (lb) < 150 lb (68 kg)       This is a list of the screening recommended for you and due dates:  Health Maintenance  Topic Date Due  . Mammogram  08/01/2021  . Tetanus Vaccine  03/14/2028  . Colon Cancer Screening  01/02/2029  . Flu Shot  Completed  . DEXA scan (bone density measurement)  Completed  . COVID-19 Vaccine  Completed  .  Hepatitis C: One time screening is recommended by Center for Disease Control  (CDC) for  adults born from [birth year] through 1963.   Completed  . Pneumonia vaccines  Completed     Try Dr. Ardis Rowan and Dr. Starleen Arms office - I believe they do take medicare    Know what a healthy weight is for you (roughly BMI <25) and aim to maintain this  Aim for 7+ servings of fruits and vegetables daily  65-80+ fluid ounces of water or unsweet tea for healthy kidneys  Limit to max 1 drink of alcohol per day; avoid smoking/tobacco  Limit animal fats in diet for cholesterol and heart health - choose grass fed whenever available  Avoid highly processed foods, and foods high in saturated/trans fats  Aim for low stress - take time to unwind and care for your mental health  Aim for 150 min of moderate intensity exercise weekly for heart health, and weights twice weekly for bone health  Aim for 7-9 hours of sleep daily           Serotonin Syndrome Serotonin is a chemical in your body (neurotransmitter) that helps to control several functions, such as:  Brain and nerve cell function.  Mood and emotions.  Memory.  Eating.  Sleeping.  Sexual activity.  Stress response. Having too much serotonin in  your body can cause serotonin syndrome. This condition can be harmful to your brain and nerve cells. This can be a life-threatening condition. What are the causes? This condition may be caused by taking medicines or drugs that increase the level of serotonin in your body, such as:  Antidepressant medicines.  Migraine medicines.  Certain pain medicines.  Certain drugs, including ecstasy, LSD, cocaine, and amphetamines.  Over-the-counter cough or cold medicines that contain dextromethorphan.  Certain herbal supplements, including St. John's wort, ginseng, and nutmeg. This condition usually occurs when you take these medicines or drugs in combination, but it can also happen with a high dose of a single medicine or drug. What increases the risk? You are more likely to develop this condition if:  You just started taking a medicine or drug that increases the level of serotonin in the body.  You recently increased the dose of a medicine or drug that increases the level of serotonin in the body.  You take more than one medicine or drug that increases the level of serotonin in the body. What are the signs or symptoms? Symptoms of this condition usually start within several hours of taking a medicine or drug. Symptoms may be mild or severe. Mild symptoms include:  Sweating.  Restlessness or agitation.  Muscle  twitching or stiffness.  Rapid heart rate.  Nausea and vomiting.  Diarrhea.  Headache.  Shivering or goose bumps.  Confusion. Severe symptoms include:  Irregular heartbeat.  Seizures.  Loss of consciousness.  High fever. How is this diagnosed? This condition may be diagnosed based on:  Your medical history.  A physical exam.  Your prior use of drugs and medicines.  Blood or urine tests. These may be used to rule out other causes of your symptoms. How is this treated? The treatment for this condition depends on the severity of your symptoms.  For mild  cases, stopping the medicine or drug that caused your condition is usually all that is needed.  For moderate to severe cases, treatment in a hospital may be needed to prevent or manage life-threatening symptoms. This may include medicines to control your symptoms, IV fluids, interventions to support your breathing, and treatments to control your body temperature. Follow these instructions at home: Medicines  Take over-the-counter and prescription medicines only as told by your health care provider. This is important.  Check with your health care provider before you start taking any new prescriptions, over-the-counter medicines, herbs, or supplements.  Avoid combining any medicines that can cause this condition to occur.   Lifestyle  Maintain a healthy lifestyle. ? Eat a healthy diet that includes plenty of vegetables, fruits, whole grains, low-fat dairy products, and lean protein. Do not eat a lot of foods that are high in fat, added sugars, or salt. ? Get the right amount and quality of sleep. Most adults need 7-9 hours of sleep each night. ? Make time to exercise, even if it is only for short periods of time. Most adults should exercise for at least 150 minutes each week. ? Do not drink alcohol. ? Do not use illegal drugs, and do not take medicines for reasons other than they are prescribed.   General instructions  Do not use any products that contain nicotine or tobacco, such as cigarettes and e-cigarettes. If you need help quitting, ask your health care provider.  Keep all follow-up visits as told by your health care provider. This is important. Contact a health care provider if:  Your symptoms do not improve or they get worse. Get help right away if you:  Have worsening confusion, severe headache, chest pain, high fever, seizures, or loss of consciousness.  Experience serious side effects of medicine, such as swelling of your face, lips, tongue, or throat.  Have serious thoughts  about hurting yourself or others. These symptoms may represent a serious problem that is an emergency. Do not wait to see if the symptoms will go away. Get medical help right away. Call your local emergency services (911 in the [place] Do not drive yourself to the hospital. If you ever feel like you may hurt yourself or others, or have thoughts about taking your own life, get help right away. You can go to your nearest emergency department or call:  Your local emergency services (911 in the [place]  A suicide crisis helpline, such as the [place] at (205) 225-2241. This is open 24 hours a day. Summary  Serotonin is a brain chemical that helps to regulate the nervous system. High levels of serotonin in the body can cause serotonin syndrome, which is a very dangerous condition.  This condition may be caused by taking medicines or drugs that increase the level of serotonin in your body.  Treatment depends on the severity of your symptoms. For mild cases,  stopping the medicine or drug that caused your condition is usually all that is needed.  Check with your health care provider before you start taking any new prescriptions, over-the-counter medicines, herbs, or supplements. This information is not intended to replace advice given to you by your health care provider. Make sure you discuss any questions you have with your health care provider. Document Revised: 08/26/2017 Document Reviewed: 08/26/2017 Elsevier Patient Education  2021 [employer].

## 2020-08-13 NOTE — Progress Notes (Signed)
CPE  Assessment:     Encounter for annual physical exam Get mamogram and DEXA as scheduled -   Essential hypertension Borderline above goal; discussed initiating medication today She declines at this time, plans to implement some lifestyle changes DASH diet, exercise and start monitoring at home.  Call if greater than 130/80.  - CBC with Differential/Platelet - CMP/GFR  Other specified hypothyroidism Managed by [name], now on armour thyroid and much improved.  Continue medications the same, reminded to take on an empty stomach 30-25mins before food.  - checking TSH, free T3, free T4 per patient request  Hyperlipidemia -continue medications, check lipids, decrease fatty foods, increase activity.  - Lipid panelt  Depression, major, active ([place]) - with anxiety and grief reaction Lexapro liquid; ? Recent SS with cymbalta to lexapro taper, poorly controlled, recommended psych, given instructions to set this up, also needing CBT Lifestyle discussed: diet/exerise, sleep hygiene, stress management, hydration  Other abnormal glucose Discussed general issues about diabetes pathophysiology and management., Educational material distributed., Suggested low cholesterol diet., Encouraged aerobic exercise., Discussed foot care., Reminded to get yearly retinal exam. Hemoglobin A1c q32m; monitor weight, serum glucose   Vitamin D deficiency At goal at recent check; continue to recommend supplementation for goal of 70-100 Check vitamin D   Osteoarthritis of both knees, unspecified osteoarthritis type Continue weight loss, better after surgery   Osteopenia Continue with evista;  Repeat DEXA - has scheduled continue Vit D and Ca, weight bearing exercises  Medication management CBC, CMP/GFR  BMI 26 Continue to recommend diet heavy in fruits and veggies and low in animal meats, cheeses, and dairy products, appropriate calorie intake Discuss exercise recommendations routinely Continue  to monitor weight at each visit   Orders Placed This Encounter  Procedures  . CBC with Differential/Platelet  . COMPLETE METABOLIC PANEL WITH GFR  . Magnesium  . Lipid panel  . TSH  . Hemoglobin A1c  . VITAMIN D 25 Hydroxy (Vit-D Deficiency, Fractures)  . Microalbumin / creatinine urine ratio  . Urinalysis, Routine w reflex microscopic  . T3, free  . T4, Free  . Vitamin B12  . EKG 12-Lead      Over 40 minutes of exam, counseling, chart review and critical decision making was performed Future Appointments  Date Time Provider [place]  08/22/2020  2:00 PM [facility] MM 2 [facility]  08/22/2020  2:30 PM [facility] DX DEXA 1 [facility]  02/19/2021  2:40 PM Philemon Kingdom, MD [facility] None  08/13/2021  2:00 PM Liane Comber, NP [facility] None     Plan:   During the course of the visit the patient was educated and counseled about appropriate screening and preventive services including:    Pneumococcal vaccine   Prevnar 13  Influenza vaccine  Td vaccine  Screening electrocardiogram  Bone densitometry screening  Colorectal cancer screening  Diabetes screening  Glaucoma screening  Nutrition counseling   Advanced directives: requested   Subjective:  Michelle Miranda is a 70 y.o. female who presents for CPE. She has OA (osteoarthritis) of knee; Hyperlipidemia; Hypertension; Hypothyroidism due to Hashimoto's thyroiditis; Other abnormal glucose; Osteopenia; Moderate episode of recurrent major depressive disorder ([place]); and Anxiety on their problem list.  First grandchild (girl) arrived in 2019, doing well, watches her during the day a few days a week. She is retired Barista.  The patient notably lost her husband to cancer abruptly early 2021, has had recurrent depression/anxiety, grief reaction, severe anxiety and panic attacks on  cymbalta 60 mg which she had been on for many years for depression. Transitioned to  lexapro but was evaluated 05/21/2020 in ED for AMS, transient confusion/disorientation, unremarkable lab/imaging. There was concern for possible med reaction due to newly on lexapro/SS, and was advised taper to stop. Note she was fully off of cymbalta for 4 weeks, on lexapro only at time of AMS event. She is also prescribed xanax which she is trying to reduce/avoid daily dosing. Per her strong request we are doing a very slow lexapro taper using liquid and reducing 1 mg/month. She is doing grief counseling which has been helpful. She is planning to reach out to psychiatry as well. Also doing CBD oils capsules, hasn't noted benefit.   She did see Dr. Jannifer Franklin who was in agreement, with a small concern for possible mild serotonin syndrome, agreed with taper off of lexapro, EEG was normal 06/09/2020.   She is on evista  for osteoporosis; + vaginal dryness but albolene helps.   BMI is Body mass index is 26.66 kg/m., she has been working on diet and exercise, walking reguarly with granddaughter in stroller, 45 min. Admits poor appetite but making sure she eats, avoiding empty calories, no desserts.  Wt Readings from Last 3 Encounters:  08/13/20 132 lb (59.9 kg)  06/05/20 139 lb (63 kg)  05/28/20 141 lb (64 kg)   She has not been checking BP at home, today their BP is BP: 110/70 She does workout. She denies chest pain, shortness of breath, dizziness.   She is not on cholesterol medication and denies myalgias. Her cholesterol is at goal. The cholesterol last visit was:   Lab Results  Component Value Date   CHOL 178 02/11/2020   HDL 71 02/11/2020   LDLCALC 92 02/11/2020   TRIG 63 02/11/2020   CHOLHDL 2.5 02/11/2020    She has not been working on diet and exercise for glucose management, and denies increased appetite, nausea, paresthesia of the feet, polydipsia, polyuria and visual disturbances. Last A1C in the office was:  Lab Results  Component Value Date   HGBA1C 5.7 (H) 07/09/2019   She is on  thyroid medication, now following with [name], was switched to armour thyroid and per patient feeling much improved. Her medication was not changed last visit.   Lab Results  Component Value Date   TSH 1.40 05/07/2020   Last GFR: Lab Results  Component Value Date   GFRNONAA >60 05/21/2020   Patient is on Vitamin D supplement.   Lab Results  Component Value Date   VD25OH 68 02/11/2020      Lab Results  Component Value Date   [id number] 751 05/19/2015     Medication Review: Current Outpatient Medications on File Prior to Visit  Medication Sig Dispense Refill  . ARMOUR THYROID 90 MG tablet Take 0.5 tablets (45 mg total) by mouth daily. ALTERNATIVE WILL NOT BE SENT. PLEASE FILL RX ASAP 45 tablet 3  . ascorbic acid (VITAMIN C) 500 MG tablet Take 500 mg by mouth daily.    . B Complex Vitamins (B COMPLEX PO) Take by mouth daily.    . Cholecalciferol (VITAMIN D3) 5000 units CAPS Take by mouth.    . Cyanocobalamin (B12 LIQUID HEALTH BOOSTER PO) Take by mouth daily.    Marland Kitchen escitalopram (LEXAPRO) 5 MG/5ML solution START TAKING 10ML DAILY INSTEAD OF TABLET. THEN IN 2-4 WEEKS REDUCE TO 9ML DAILY X 2 WEEKS. CONTINUE TO REDUCE BY 1ML EVERY 2-4 WEEKS UNTIL DONE 240 mL 3  .  MAGNESIUM GLUCONATE PO Take by mouth 2 (two) times daily.    Marland Kitchen MELATONIN PO Take 5 mg by mouth daily as needed.    . raloxifene (EVISTA) 60 MG tablet TAKE 1 TABLET DAILY FOR OSTEOPOROSIS 90 tablet 3  . ALPRAZolam (XANAX) 0.5 MG tablet Take 1/2-1 tab up to three times in a day for severe anxiety or panic attacks. Avoid taking daily due to addictive nature, limit to <5 days/week (Patient not taking: No sig reported) 60 tablet 0  . fluticasone (FLONASE) 50 MCG/ACT nasal spray Place 1-2 sprays into both nostrils daily as needed for allergies or rhinitis. (Patient not taking: Reported on 08/13/2020) 16 g 2  . Multiple Vitamin (MULTIVITAMIN) tablet Take 1 tablet by mouth daily. (Patient not taking: Reported on 08/13/2020)     No  current facility-administered medications on file prior to visit.    Allergies  Allergen Reactions  . Oxycodone     "Found Oxycodone not effective for pain control" with last RTKA  . Other     Opiods,causes severe anxiety    Current Problems (verified) Patient Active Problem List   Diagnosis Date Noted  . Anxiety   . Moderate episode of recurrent major depressive disorder ([place]) 03/27/2020  . Other abnormal glucose 05/19/2015  . Osteopenia 05/19/2015  . Hyperlipidemia   . Hypertension   . Hypothyroidism due to Hashimoto's thyroiditis   . OA (osteoarthritis) of knee 01/03/2012    Screening Tests Immunization History  Administered Date(s) Administered  . Fluad Quad(high Dose 65+) 04/12/2019  . Influenza Inj Mdck Quad Pf 05/23/2017  . Influenza Split 05/10/2014, 05/19/2015  . Influenza, High Dose Seasonal PF 07/02/2018, 08/14/2018  . Influenza,inj,Quad PF,6+ Mos 03/23/2016  . Influenza-Unspecified 04/12/2019, 07/07/2020  . PFIZER SARS-COV-2 Vaccination 09/13/2019, 10/08/2019, 07/07/2020  . Pneumococcal Conjugate-13 07/09/2019  . Pneumococcal Polysaccharide-23 03/14/2018  . Td 06/02/2006  . Tdap 03/14/2018   Tetanus: 2007, 2019 Pneumovax: 03/2018  Prevnar 13: 07/2019 Flu vaccine: 13/2021 Zostavax: declines Covid 19: 3/3, 2021, pfizer  Pap: 03/2018, never abnormal, HPV neg, DONE MGM: 07/2019, has scheduled later this month DEXA: 07/2018, T-2.4 L fem, on evista, has bone density schedule Colonoscopy: 01/2019 Dr. Henrene Pastor, 10 year follow up EGD: N/A CTA 09/2012  Names of Other Physician/Practitioners you currently use: 1. [facility] here for primary care 2. Vision: Dr. Bing Plume, last visit 2021 3. Dental: Dr. Marquette Saa, last visit 2021, looking for a new dentist  4. Derm: Dr. Ronnald Ramp, last several years back, planning to schedule this year   [name], [name] [name], MD  as Consulting Physician (Orthopedic Surgery) [name], MD as Consulting Physician (Ophthalmology) Irene Shipper, MD as Consulting Physician (Gastroenterology) Philemon Kingdom, MD as Consulting Physician (Internal Medicine) Garnet Sierras, NP as Nurse Practitioner (Adult Health Nurse Practitioner)  SURGICAL HISTORY She  has a past surgical history that includes c sections (jan 1980 and march 1984); ovarian cyst with lararoscopy march 1998 (1998); Total knee arthroplasty (01/03/2012); Wisdom tooth extraction (09-05-12); Total knee arthroplasty (Left, 09/11/2012); Cataract extraction, bilateral (Bilateral, 2016); and Colonoscopy. FAMILY HISTORY Her family history includes Arthritis in her mother; Breast cancer in her maternal grandmother and paternal grandmother; COPD in her father; Cancer in her mother; Hypertension in her mother; Stroke (age of onset: 72) in her maternal grandmother; Thyroid disease in her mother. SOCIAL HISTORY She  reports that she has never smoked. She has never used smokeless tobacco. She reports previous  alcohol use. She reports that she does not use drugs.   Review of Systems  Constitutional: Negative for malaise/fatigue and weight loss.  HENT: Negative for hearing loss and tinnitus.   Eyes: Negative for blurred vision and double vision.  Respiratory: Negative for cough, sputum production, shortness of breath and wheezing.   Cardiovascular: Negative for chest pain, palpitations, orthopnea, claudication, leg swelling and PND.  Gastrointestinal: Negative for abdominal pain, blood in stool, constipation, diarrhea, heartburn, melena, nausea and vomiting.  Genitourinary: Negative.   Musculoskeletal: Negative for falls, joint pain and myalgias.  Skin: Negative for rash.  Neurological: Negative for dizziness, tingling, sensory change, weakness and headaches.  Endo/Heme/Allergies: Negative for polydipsia.  Psychiatric/Behavioral: Negative for depression, memory loss,  substance abuse and suicidal ideas. The patient is not nervous/anxious and does not have insomnia.   All other systems reviewed and are negative.    Objective:     Today's Vitals   08/13/20 1352  BP: 110/70  Pulse: 72  Temp: 97.7 F (36.5 C)  SpO2: 98%  Weight: 132 lb (59.9 kg)  Height: 4\' 11"  (1.499 m)   Body mass index is 26.66 kg/m.  General appearance: alert, no distress, WD/WN, female HEENT: normocephalic, sclerae anicteric, TMs pearly, nares patent, no discharge or erythema, pharynx normal Oral cavity: MMM, no lesions Neck: supple, no lymphadenopathy, no thyromegaly, no masses Heart: RRR, normal S1, S2, no murmurs Lungs: CTA bilaterally, no wheezes, rhonchi, or rales Abdomen: +bs, soft, non tender, non distended, no masses, no hepatomegaly, no splenomegaly Musculoskeletal: nontender, no swelling, no obvious deformity Extremities: no edema, no cyanosis, no clubbing Pulses: 2+ symmetric, upper and lower extremities, normal cap refill Neurological: alert, oriented x 3, CN2-12 intact, strength normal upper extremities and lower extremities, sensation normal throughout, DTRs 2+ throughout, no cerebellar signs, gait normal Psychiatric: normal affect, behavior normal, pleasant  Breasts: breasts appear normal, no suspicious masses, no skin or nipple changes or axillary nodes. GU: defer, no concerns  EKG: NSR, no ST changes  Izora Ribas, NP   08/13/2020

## 2020-08-14 ENCOUNTER — Encounter: Payer: Self-pay | Admitting: Adult Health

## 2020-08-14 DIAGNOSIS — N182 Chronic kidney disease, stage 2 (mild): Secondary | ICD-10-CM | POA: Insufficient documentation

## 2020-08-14 LAB — CBC WITH DIFFERENTIAL/PLATELET
Absolute Monocytes: 864 cells/uL (ref 200–950)
Basophils Absolute: 88 cells/uL (ref 0–200)
Basophils Relative: 1.1 %
Eosinophils Absolute: 112 cells/uL (ref 15–500)
Eosinophils Relative: 1.4 %
HCT: 38.8 % (ref 35.0–45.0)
Hemoglobin: 13.1 g/dL (ref 11.7–15.5)
Lymphs Abs: 1936 cells/uL (ref 850–3900)
MCH: 30.4 pg (ref 27.0–33.0)
MCHC: 33.8 g/dL (ref 32.0–36.0)
MCV: 90 fL (ref 80.0–100.0)
MPV: 10 fL (ref 7.5–12.5)
Monocytes Relative: 10.8 %
Neutro Abs: 5000 cells/uL (ref 1500–7800)
Neutrophils Relative %: 62.5 %
Platelets: 290 10*3/uL (ref 140–400)
RBC: 4.31 10*6/uL (ref 3.80–5.10)
RDW: 13.5 % (ref 11.0–15.0)
Total Lymphocyte: 24.2 %
WBC: 8 10*3/uL (ref 3.8–10.8)

## 2020-08-14 LAB — COMPLETE METABOLIC PANEL WITH GFR
AG Ratio: 1.5 (calc) (ref 1.0–2.5)
ALT: 25 U/L (ref 6–29)
AST: 24 U/L (ref 10–35)
Albumin: 4.1 g/dL (ref 3.6–5.1)
Alkaline phosphatase (APISO): 80 U/L (ref 37–153)
BUN: 11 mg/dL (ref 7–25)
CO2: 29 mmol/L (ref 20–32)
Calcium: 9.2 mg/dL (ref 8.6–10.4)
Chloride: 106 mmol/L (ref 98–110)
Creat: 0.87 mg/dL (ref 0.50–0.99)
GFR, Est African American: 80 mL/min/{1.73_m2} (ref >=60)
GFR, Est Non African American: 69 mL/min/{1.73_m2} (ref >=60)
Globulin: 2.7 g/dL (calc) (ref 1.9–3.7)
Glucose, Bld: 88 mg/dL (ref 65–99)
Potassium: 4.3 mmol/L (ref 3.5–5.3)
Sodium: 143 mmol/L (ref 135–146)
Total Bilirubin: 0.5 mg/dL (ref 0.2–1.2)
Total Protein: 6.8 g/dL (ref 6.1–8.1)

## 2020-08-14 LAB — URINALYSIS, ROUTINE W REFLEX MICROSCOPIC
Bilirubin Urine: NEGATIVE
Glucose, UA: NEGATIVE
Hgb urine dipstick: NEGATIVE
Ketones, ur: NEGATIVE
Leukocytes,Ua: NEGATIVE
Nitrite: NEGATIVE
Protein, ur: NEGATIVE
Specific Gravity, Urine: 1.008 (ref 1.001–1.03)
pH: 8 (ref 5.0–8.0)

## 2020-08-14 LAB — HEMOGLOBIN A1C
Hgb A1c MFr Bld: 5.7 % of total Hgb — ABNORMAL HIGH (ref <5.7)
Mean Plasma Glucose: 117 mg/dL
eAG (mmol/L): 6.5 mmol/L

## 2020-08-14 LAB — VITAMIN B12: Vitamin B-12: 1322 pg/mL — ABNORMAL HIGH (ref 200–1100)

## 2020-08-14 LAB — LIPID PANEL
Cholesterol: 175 mg/dL (ref <200)
HDL: 73 mg/dL (ref >=50)
LDL Cholesterol (Calc): 87 mg/dL (calc)
Non-HDL Cholesterol (Calc): 102 mg/dL (calc) (ref <130)
Total CHOL/HDL Ratio: 2.4 (calc) (ref <5.0)
Triglycerides: 63 mg/dL (ref <150)

## 2020-08-14 LAB — MICROALBUMIN / CREATININE URINE RATIO
Creatinine, Urine: 57 mg/dL (ref 20–275)
Microalb Creat Ratio: 4 mcg/mg creat (ref <30)
Microalb, Ur: 0.2 mg/dL

## 2020-08-14 LAB — TSH: TSH: 1.59 mIU/L (ref 0.40–4.50)

## 2020-08-14 LAB — VITAMIN D 25 HYDROXY (VIT D DEFICIENCY, FRACTURES): Vit D, 25-Hydroxy: 79 ng/mL (ref 30–100)

## 2020-08-14 LAB — T4, FREE: Free T4: 1.1 ng/dL (ref 0.8–1.8)

## 2020-08-14 LAB — T3, FREE: T3, Free: 3.2 pg/mL (ref 2.3–4.2)

## 2020-08-14 LAB — MAGNESIUM: Magnesium: 2.2 mg/dL (ref 1.5–2.5)

## 2020-08-22 ENCOUNTER — Ambulatory Visit
Admission: RE | Admit: 2020-08-22 | Discharge: 2020-08-22 | Disposition: A | Discharge status: Home / Self Care | Payer: Medicare Other | Source: Ambulatory Visit | Attending: Internal Medicine | Admitting: Internal Medicine

## 2020-08-22 ENCOUNTER — Other Ambulatory Visit: Payer: Self-pay

## 2020-08-22 ENCOUNTER — Ambulatory Visit
Admission: RE | Admit: 2020-08-22 | Discharge: 2020-08-22 | Disposition: A | Discharge status: Home / Self Care | Payer: Medicare Other | Source: Ambulatory Visit | Attending: Adult Health | Admitting: Adult Health

## 2020-08-22 DIAGNOSIS — M8580 Other specified disorders of bone density and structure, unspecified site: Secondary | ICD-10-CM

## 2020-08-22 DIAGNOSIS — Z1231 Encounter for screening mammogram for malignant neoplasm of breast: Secondary | ICD-10-CM

## 2020-08-22 DIAGNOSIS — E2839 Other primary ovarian failure: Secondary | ICD-10-CM

## 2020-08-22 DIAGNOSIS — Z780 Asymptomatic menopausal state: Secondary | ICD-10-CM | POA: Diagnosis not present

## 2020-08-22 DIAGNOSIS — M810 Age-related osteoporosis without current pathological fracture: Secondary | ICD-10-CM | POA: Diagnosis not present

## 2020-08-22 DIAGNOSIS — M8588 Other specified disorders of bone density and structure, other site: Secondary | ICD-10-CM | POA: Diagnosis not present

## 2020-08-25 ENCOUNTER — Other Ambulatory Visit: Payer: Self-pay | Admitting: Adult Health

## 2020-08-25 ENCOUNTER — Encounter: Payer: Self-pay | Admitting: Adult Health

## 2020-08-25 DIAGNOSIS — M810 Age-related osteoporosis without current pathological fracture: Secondary | ICD-10-CM

## 2020-08-25 MED ORDER — ALENDRONATE SODIUM 70 MG PO TABS: 70.0000 mg | ORAL_TABLET | ORAL | 3 refills | Status: DC

## 2020-09-02 ENCOUNTER — Encounter: Payer: Self-pay | Admitting: Internal Medicine

## 2020-09-11 ENCOUNTER — Other Ambulatory Visit: Payer: Self-pay | Admitting: Adult Health

## 2020-09-16 DIAGNOSIS — L821 Other seborrheic keratosis: Secondary | ICD-10-CM | POA: Diagnosis not present

## 2020-09-16 DIAGNOSIS — L814 Other melanin hyperpigmentation: Secondary | ICD-10-CM | POA: Diagnosis not present

## 2020-09-16 DIAGNOSIS — D1801 Hemangioma of skin and subcutaneous tissue: Secondary | ICD-10-CM | POA: Diagnosis not present

## 2020-09-16 DIAGNOSIS — D2272 Melanocytic nevi of left lower limb, including hip: Secondary | ICD-10-CM | POA: Diagnosis not present

## 2020-09-16 DIAGNOSIS — L84 Corns and callosities: Secondary | ICD-10-CM | POA: Diagnosis not present

## 2020-10-13 ENCOUNTER — Ambulatory Visit: Payer: Medicare Other | Admitting: Podiatry

## 2020-10-13 ENCOUNTER — Ambulatory Visit: Payer: Medicare Other | Admitting: Internal Medicine

## 2020-10-23 DIAGNOSIS — Z79891 Long term (current) use of opiate analgesic: Secondary | ICD-10-CM | POA: Diagnosis not present

## 2020-10-23 DIAGNOSIS — F331 Major depressive disorder, recurrent, moderate: Secondary | ICD-10-CM | POA: Diagnosis not present

## 2020-10-23 DIAGNOSIS — F411 Generalized anxiety disorder: Secondary | ICD-10-CM | POA: Diagnosis not present

## 2020-10-24 ENCOUNTER — Encounter: Payer: Self-pay | Admitting: Internal Medicine

## 2020-10-24 ENCOUNTER — Ambulatory Visit (INDEPENDENT_AMBULATORY_CARE_PROVIDER_SITE_OTHER): Payer: Medicare Other | Admitting: Internal Medicine

## 2020-10-24 ENCOUNTER — Other Ambulatory Visit: Payer: Self-pay

## 2020-10-24 VITALS — BP 120/78 | HR 67 | Ht 59.0 in | Wt 126.6 lb

## 2020-10-24 DIAGNOSIS — M810 Age-related osteoporosis without current pathological fracture: Secondary | ICD-10-CM

## 2020-10-24 DIAGNOSIS — E063 Autoimmune thyroiditis: Secondary | ICD-10-CM

## 2020-10-24 DIAGNOSIS — E038 Other specified hypothyroidism: Secondary | ICD-10-CM

## 2020-10-24 NOTE — Progress Notes (Addendum)
Patient ID: Emarie Paul, female   DOB: 12/31/1950, 70 y.o.   MRN: [id number]   This visit occurred during the SARS-CoV-2 public health emergency.  Safety protocols were in place, including screening questions prior to the visit, additional usage of staff PPE, and extensive cleaning of exam room while observing appropriate contact time as indicated for disinfecting solutions.   HPI  Mandalyn Pasqua Barsanti is a 70 y.o.-year-old female, initially referred by her PCP, Vicie Mutters, NP (Dr. Melford Aase), returning for follow-up for Hashimoto's hypothyroidism and she was also wanting to address her osteoporosis. Last visit 7 mo ago.  Interim history: Before last visit, her husband died expectantly 1 mo ago of an aggressive cancer.  She lost 19 pounds in the 6 months prior to our last visit.  At that time she mentioned that this was not intentional.  At this visit, she lost 33 more pounds.  She mentions that her appetite is decreased. Since last visit, she had a new bone density scan that showed osteoporosis.  She would want to discuss about this today, also. She started [employer] and using a vibration platform to help with osteoporosis, but also other symptoms like anxiety.  Hashimoto's hypothyroidism: Reviewed and addended history: Pt. has been dx with hypothyroidism in 2012 and Hashimoto's thyroiditis in 04/2016 >> prev. On Levothyroxine, then on Synthroid d.a.w. 37.5 mcg 6/7 and 75 mcg 1/7 (changed in 01/2016).  Latest thyroid dose change was to 50 mcg daily and she has maintained normal TFTs afterwards.    We changed from levothyroxine to Armour 01/2019.  She feels much better on Armour, with less fatigue.  She takes Armour 45 mg daily: - in am (around 3 AM, then goes back to bed) - fasting - at least 30 min from b'fast - no Ca, Fe,  PPIs, + MVI more than 4 hours later  She was previously on selenium 200 mcg daily, but now off.  Reviewed her TFTs: Lab Results   Component Value Date   TSH 1.59 08/13/2020   TSH 1.40 05/07/2020   TSH 2.06 02/11/2020   TSH 1.71 10/17/2019   TSH 3.29 08/23/2019   TSH 2.50 06/13/2019   TSH 2.77 02/26/2019   TSH 2.33 10/04/2018   TSH 1.32 02/10/2018   TSH 1.62 02/10/2017   FREET4 1.1 08/13/2020   FREET4 0.77 05/07/2020   FREET4 0.70 08/23/2019   FREET4 0.61 06/13/2019   FREET4 0.76 02/26/2019   FREET4 0.90 02/10/2018   FREET4 0.74 02/10/2017   FREET4 0.70 05/18/2016   FREET4 0.79 04/09/2016    She has a history of Hashimoto thyroiditis based on elevated TPO antibodies: Component     Latest Ref Rng & Units 04/09/2016  Thyroperoxidase Ab SerPl-aCnc     <9 IU/mL 9 (H)  Thyroglobulin Ab     <2 IU/mL <1   Pt denies: - feeling nodules in neck - hoarseness - dysphagia - SOB with lying down But she has occasional choking, which is chronic.  She has + FH of thyroid disorders in: mother - hypothyroid. No FH of thyroid cancer. No h/o radiation tx to head or neck.  No seaweed or kelp. No recent contrast studies. + herbal supplements (L theanine). On Magnesium. No recent steroids use.   Osteoporosis:  Pt was dx with OP in 08/2020.  I reviewed pt's DXA scans: Date Spine FN T score 33% distal Radius  [facility] (L1, L2) L3-L4: -1.3 (-7.1%*) RFN: -2.2 LFN: -2.6 n/a  07/21/2018 [facility])  n/a RFN: -2.0 LFN: -2.4 -1.4  07/18/2015 ([facility]) L1-L4 (L3): -1.6 RFN: -1.8 LFN: -2.2 n/a   She denies fractures.  No falls.  She does have height loss.  No dizziness/vertigo/orthostasis/poor vision.   Previous OP treatments:  - Evista  ~2005 >>  08/2020 - she was recommended Fosamax  She does not have dental work coming up.  No h/o vitamin D deficiency. Reviewed available vit D levels: Lab Results  Component Value Date   VD25OH 79 08/13/2020   VD25OH 68 02/11/2020   VD25OH 48 07/09/2019   VD25OH 71 03/14/2018   VD25OH 57 07/06/2016   VD25OH 60 02/23/2016   VD25OH 39 05/19/2015    VD25OH 30 01/01/2015   VD25OH 46 05/10/2014   Pt is on: - vitamin D 5000 units daily  No weight bearing exercises. Used to do Pilates.  She does not take high vitamin A doses.  Menopause was at 70 y/o.   + FH of osteoporosis: In mother.  No h/o persistent hyper/hypocalcemia or hyperparathyroidism. No h/o kidney stones. Lab Results  Component Value Date   CALCIUM 9.2 08/13/2020   CALCIUM 8.6 (L) 05/21/2020   CALCIUM 9.5 03/06/2020   CALCIUM 9.3 02/11/2020   CALCIUM 9.2 10/17/2019   CALCIUM 9.0 07/09/2019   CALCIUM 9.5 10/04/2018   CALCIUM 9.4 03/14/2018   CALCIUM 8.7 07/06/2016   CALCIUM 9.2 02/23/2016   No h/o CKD. Last BUN/Cr: Lab Results  Component Value Date   BUN 11 08/13/2020   CREATININE 0.87 08/13/2020   ROS: Constitutional: no weight gain/+++ weight loss, no fatigue, + subjective hyperthermia, no subjective hypothermia Eyes: no blurry vision, no xerophthalmia ENT: no sore throat, + see HPI Cardiovascular: no CP/no SOB/no palpitations/no leg swelling Respiratory: no cough/no SOB/no wheezing Gastrointestinal: no N/no V/no D/no C/no acid reflux Musculoskeletal: no muscle aches/no joint aches Skin: no rashes, no hair loss Neurological: + tremors/no numbness/no tingling/no dizziness  I reviewed pt's medications, allergies, PMH, social hx, family hx, and changes were documented in the history of present illness. Otherwise, unchanged from my initial visit note.  Past Medical History:  Diagnosis Date  . Anxiety    occasional  . Arthritis   . Depression   . Hyperlipidemia   . Hypertension   . Hypothyroid    hypo thyroid  . Osteopenia   . Urinary tract infection 12/15/2011   once  . Vitamin D deficiency    Past Surgical History:  Procedure Laterality Date  . c sections  jan 1980 and march 1984  . CATARACT EXTRACTION, BILATERAL Bilateral 2016   Dr. Bing Plume  . COLONOSCOPY    . ovarian cyst with lararoscopy march 1998  1998  . TOTAL KNEE ARTHROPLASTY   01/03/2012   Procedure: TOTAL KNEE ARTHROPLASTY;  Surgeon: Gearlean Alf, MD;  Location: [facility];  Service: Orthopedics;  Laterality: Right;  . TOTAL KNEE ARTHROPLASTY Left 09/11/2012   Procedure: Left Total Knee Arthroplasty;  Surgeon: Gearlean Alf, MD;  Location: [facility];  Service: Orthopedics;  Laterality: Left;  . WISDOM TOOTH EXTRACTION  09-05-12   extractions   Social History   Social History  . Marital status: Married    Spouse name: N/A  . Number of children: 2   Occupational History  . Law office admin   Social History Main Topics  . Smoking status: Never Smoker  . Smokeless tobacco: Never Used  . Alcohol use Yes     Comment: 1-2 glass wine night  . Drug use: No  Current Outpatient Medications on File Prior to Visit  Medication Sig Dispense Refill  . alendronate (FOSAMAX) 70 MG tablet Take 1 tablet (70 mg total) by mouth every 7 (seven) days. Take with a full glass of water on an empty stomach. 12 tablet 3  . ALPRAZolam (XANAX) 0.5 MG tablet Take 1/2-1 tab up to three times in a day for severe anxiety or panic attacks. Avoid taking daily due to addictive nature, limit to <5 days/week (Patient not taking: No sig reported) 60 tablet 0  . ARMOUR THYROID 90 MG tablet Take 0.5 tablets (45 mg total) by mouth daily. ALTERNATIVE WILL NOT BE SENT. PLEASE FILL RX ASAP 45 tablet 3  . ascorbic acid (VITAMIN C) 500 MG tablet Take 500 mg by mouth daily.    . B Complex Vitamins (B COMPLEX PO) Take by mouth daily.    . Cholecalciferol (VITAMIN D3) 5000 units CAPS Take by mouth.    . Cyanocobalamin (B12 LIQUID HEALTH BOOSTER PO) Take by mouth daily.    Marland Kitchen escitalopram (LEXAPRO) 5 MG/5ML solution START TAKING 10ML DAILY INSTEAD OF TABLET. THEN IN 2-4 WEEKS REDUCE TO 9ML DAILY X 2 WEEKS. CONTINUE TO REDUCE BY 1ML EVERY 2-4 WEEKS UNTIL DONE 240 mL 3  . fluticasone (FLONASE) 50 MCG/ACT nasal spray Place 1-2 sprays into both nostrils daily as needed for allergies or rhinitis. (Patient not taking:  Reported on 08/13/2020) 16 g 2  . MAGNESIUM GLUCONATE PO Take by mouth 2 (two) times daily.    Marland Kitchen MELATONIN PO Take 5 mg by mouth daily as needed.    . Multiple Vitamin (MULTIVITAMIN) tablet Take 1 tablet by mouth daily. (Patient not taking: Reported on 08/13/2020)     No current facility-administered medications on file prior to visit.   Allergies  Allergen Reactions  . Oxycodone     "Found Oxycodone not effective for pain control" with last RTKA  . Other     Opiods,causes severe anxiety   Family History  Problem Relation Age of Onset  . Hypertension Mother   . Thyroid disease Mother   . Arthritis Mother   . Cancer Mother        gallbladder cancer  . COPD Father   . Breast cancer Maternal Grandmother        78s  . Stroke Maternal Grandmother 37  . Breast cancer Paternal Grandmother   . Colon cancer Neg Hx   . Esophageal cancer Neg Hx   . Stomach cancer Neg Hx   . Rectal cancer Neg Hx    PE: BP 120/78 (BP Location: Right Arm, Patient Position: Sitting, Cuff Size: Normal)   Pulse 67   Ht 4\' 11"  (1.499 m)   Wt 126 lb 9.6 oz (57.4 kg)   SpO2 98%   BMI 25.57 kg/m  Wt Readings from Last 3 Encounters:  10/24/20 126 lb 9.6 oz (57.4 kg)  08/13/20 132 lb (59.9 kg)  06/05/20 139 lb (63 kg)   Constitutional: normal weight, in NAD Eyes: PERRLA, EOMI, no exophthalmos ENT: moist mucous membranes, no thyromegaly, no cervical lymphadenopathy Cardiovascular: RRR, No MRG Respiratory: CTA B Gastrointestinal: abdomen soft, NT, ND, BS+ Musculoskeletal: no deformities, strength intact in all 4 Skin: moist, warm, no rashes Neurological: + Chronic tremor with outstretched hands, DTR normal in all 4  ASSESSMENT: 1. Hypothyroidism 2/2 Hashimoto's thyroiditis  2.  Osteoporosis (OP)  PLAN:  1. Patient with longstanding Hashimoto's hypothyroidism, on Armour thyroid due to persistent fatigue on levothyroxine.  On Armour, she feels  more energetic.  She tolerates it very well. - latest  thyroid labs reviewed with pt >> normal: Lab Results  Component Value Date   TSH 1.59 08/13/2020   - she continues on Armour 45 mg daily - pt feels good on this dose, but continues to lose weight.  She lost a significant amount of weight before last visit (19 pounds), partly intentionally partly after the death of her husband.  At this visit, she lost 33 lbs since last OV.We discussed that due to the drastic changes in her weight, we may need a lower dose of Armour in the future.  - we discussed about taking the thyroid hormone every day, with water, >30 minutes before breakfast, separated by >4 hours from acid reflux medications, calcium, iron, multivitamins. Pt. is taking it correctly. - will check thyroid tests -she will return for these (no lab today): TSH, free T3 and fT4 - If labs are abnormal, she will need to return for repeat TFTs in 1.5 months  2. OP -New problem for me - likely postmenopausal/age-related, possibly due to low calcium intake she has FH of OP - Discussed about increased risk of fracture, depending on the T score, greatly increased when the T score is lower than -2.5, but it is actually a continuum and -2.5 should not be regarded as an absolute threshold. We reviewed her DXA scan report together, and I explained that based on the T scores, she has an increased risk for fractures.  - she did not have fragility fractures, only a toe fracture - we reviewed her dietary and supplemental calcium and vitamin D intake. I recommended to make sure she gets 1000-1200 mg of calcium daily preferentially from diet.  Her vitamin D level recently normal, 79, on 08/13/2020. - discussed fall precautions   - given handout from [facility] Re: weight bearing exercises - advised to do this every day or at least 5/7 days - I also recommended the [facility] - for skeletal loading.  Explained benefits.  She already uses a vibration platform, which should also help. - we  discussed about maintaining a good amount of protein in her diet. The recommended daily protein intake is ~0.8 g per kilogram (approximately 45 g per her) per day. I advised her to try to aim for this amount, since a diet low in proteins can exacerbate osteoporosis. Also, avoid smoking or >2 drinks of alcohol a day. -She was on Evista for a long time, and I explained that this helps with osteoporosis, also.  She was taken off to get this done after the bone density returned, however - We discussed about the different medication classes, benefits and side effects (including atypical fractures and ONJ - no dental workup in progress or planned).  - I explained that p.o. or IV bisphosphonates or sq denosumab (Prolia). I would use Teriparatide/Abaloparatide (which are daily subcu medication) or Romosozumab (monthly) as a last resort.  -She was already prescribed Fosamax by PCP.  She does have it at home but did not start it pending our discussion today.  I did recommend that we try this for the next 2 years and then see if we can continue more need to switch to a parenteral medication.  I advised her to take Fosamax before Armour if she takes Advair in the morning, however, if she takes it at night, she can just wait until morning to take Fosamax. - will check a new DXA scan in 2 years after starting Prolia -  I explained that the first indication that the treatment is working is her not having fractures. T-score changes are secondary: unchanged or slightly higher T-scores are desirable - will see pt back in a year  - Total time spent for the visit: 40 min, in obtaining medical information from the chart and from the patient, reviewing her  previous labs, imaging evaluations, and treatments, reviewing her symptoms, counseling her about her conditions (please see the discussed topics above), and developing a plan to further investigate and treat them; she had a number of questions which I addressed.  Orders  Placed This Encounter  Procedures  . TSH  . T4, free  . T3, free   Component     Latest Ref Rng & Units 10/27/2020  TSH     0.35 - 4.50 uIU/mL 1.86  T4,Free(Direct)     0.60 - 1.60 ng/dL 0.82  Triiodothyronine,Free,Serum     2.3 - 4.2 pg/mL 3.3   Normal TFTs.  Philemon Kingdom, MD PhD [facility]

## 2020-10-24 NOTE — Patient Instructions (Addendum)
Please continue Armour 45 mg daily.  Take the thyroid hormone every day, with water, at least 30 minutes before breakfast, separated by at least 4 hours from: - acid reflux medications - calcium - iron - multivitamins  Please start: - Fosamax 70 mg weekly, 30 min before Armour.  Please return in 1 year.  How Can I Prevent Falls? Men and women with osteoporosis need to take care not to fall down. Falls can break bones. Some reasons people fall are: Poor vision  Poor balance  Certain diseases that affect how you walk  Some types of medicine, such as sleeping pills.  Some tips to help prevent falls outdoors are: Use a cane or walker  Wear rubber-soled shoes so you don't slip  Walk on grass when sidewalks are slippery  In winter, put salt or kitty litter on icy sidewalks.  Some ways to help prevent falls indoors are: Keep rooms free of clutter, especially on floors  Use plastic or carpet runners on slippery floors  Wear low-heeled shoes that provide good support  Do not walk in socks, stockings, or slippers  Be sure carpets and area rugs have skid-proof backs or are tacked to the floor  Be sure stairs are well lit and have rails on both sides  Put grab bars on bathroom walls near tub, shower, and toilet  Use a rubber bath mat in the shower or tub  Keep a flashlight next to your bed  Use a sturdy step stool with a handrail and wide steps  Add more lights in rooms (and night lights) Buy a cordless phone to keep with you so that you don't have to rush to the phone       when it rings and so that you can call for help if you fall.   (adapted from http://www.niams.NightlifePreviews.se)  Exercise for Strong Bones (from [employer]) There are two types of exercises that are important for building and maintaining bone density:  weight-bearing and muscle-strengthening exercises. Weight-bearing Exercises These exercises include  activities that make you move against gravity while staying upright. Weight-bearing exercises can be high-impact or low-impact. High-impact weight-bearing exercises help build bones and keep them strong. If you have broken a bone due to osteoporosis or are at risk of breaking a bone, you may need to avoid high-impact exercises. If you're not sure, you should check with your healthcare provider. Examples of high-impact weight-bearing exercises are: . Dancing . Doing high-impact aerobics . Hiking . Jogging/running . Jumping Rope . Stair climbing . Tennis Low-impact weight-bearing exercises can also help keep bones strong and are a safe alternative if you cannot do high-impact exercises. Examples of low-impact weight-bearing exercises are: . Using elliptical training machines . Doing low-impact aerobics . Using stair-step machines . Fast walking on a treadmill or outside Muscle-Strengthening Exercises These exercises include activities where you move your body, a weight or some other resistance against gravity. They are also known as resistance exercises and include: . Lifting weights . Using elastic exercise bands . Using weight machines . Lifting your own body weight . Functional movements, such as standing and rising up on your toes Yoga and Pilates can also improve strength, balance and flexibility. However, certain positions may not be safe for people with osteoporosis or those at increased risk of broken bones. For example, exercises that have you bend forward may increase the chance of breaking a bone in the spine. A physical therapist should be able to help you learn which exercises  are safe and appropriate for you. Non-Impact Exercises Non-impact exercises can help you to improve balance, posture and how well you move in everyday activities. These exercises can also help to increase muscle strength and decrease the risk of falls and broken bones. Some of these exercises  include: . Balance exercises that strengthen your legs and test your balance, such as Tai Chi, can decrease your risk of falls. . Posture exercises that improve your posture and reduce rounded or "sloping" shoulders can help you decrease the chance of breaking a bone, especially in the spine. . Functional exercises that improve how well you move can help you with everyday activities and decrease your chance of falling and breaking a bone. For example, if you have trouble getting up from a chair or climbing stairs, you should do these activities as exercises. A physical therapist can teach you balance, posture and functional exercises. Starting a New Exercise Program If you haven't exercised regularly for a while, check with your healthcare provider before beginning a new exercise program--particularly if you have health problems such as heart disease, diabetes or high blood pressure. If you're at high risk of breaking a bone, you should work with a physical therapist to develop a safe exercise program. Once you have your healthcare provider's approval, start slowly. If you've already broken bones in the spine because of osteoporosis, be very careful to avoid activities that require reaching down, bending forward, rapid twisting motions, heavy lifting and those that increase your chance of a fall. As you get started, your muscles may feel sore for a day or two after you exercise. If soreness lasts longer, you may be working too hard and need to ease up. Exercises should be done in a pain-free range of motion. How Much Exercise Do You Need? Weight-bearing exercises 30 minutes on most days of the week. Do a 30-minutesession or multiple sessions spread out throughout the day. The benefits to your bones are the same.   Muscle-strengthening exercises Two to three days per week. If you don't have much time for strengthening/resistance training, do small amounts at a time. You can do just one body part each day.  For example do arms one day, legs the next and trunk the next. You can also spread these exercises out during your normal day.  Balance, posture and functional exercises Every day or as often as needed. You may want to focus on one area more than the others. If you have fallen or lose your balance, spend time doing balance exercises. If you are getting rounded shoulders, work more on posture exercises. If you have trouble climbing stairs or getting up from the couch, do more functional exercises. You can also perform these exercises at one time or spread them during your day. Work with a phyiscal therapist to learn the right exercises for you.   Please come back for a follow-up appointment in 1 year.  Marland Kitchen

## 2020-10-27 ENCOUNTER — Other Ambulatory Visit (INDEPENDENT_AMBULATORY_CARE_PROVIDER_SITE_OTHER): Payer: Medicare Other

## 2020-10-27 ENCOUNTER — Other Ambulatory Visit: Payer: Self-pay

## 2020-10-27 DIAGNOSIS — E038 Other specified hypothyroidism: Secondary | ICD-10-CM | POA: Diagnosis not present

## 2020-10-27 DIAGNOSIS — E063 Autoimmune thyroiditis: Secondary | ICD-10-CM

## 2020-10-27 LAB — T4, FREE: Free T4: 0.82 ng/dL (ref 0.60–1.60)

## 2020-10-27 LAB — T3, FREE: T3, Free: 3.3 pg/mL (ref 2.3–4.2)

## 2020-10-27 LAB — TSH: TSH: 1.86 u[IU]/mL (ref 0.35–4.50)

## 2020-10-29 DIAGNOSIS — F411 Generalized anxiety disorder: Secondary | ICD-10-CM | POA: Diagnosis not present

## 2020-10-31 ENCOUNTER — Other Ambulatory Visit: Payer: Self-pay

## 2020-10-31 ENCOUNTER — Ambulatory Visit (INDEPENDENT_AMBULATORY_CARE_PROVIDER_SITE_OTHER): Payer: Medicare Other | Admitting: Podiatry

## 2020-10-31 ENCOUNTER — Ambulatory Visit: Payer: Medicare Other | Admitting: Podiatry

## 2020-10-31 ENCOUNTER — Encounter: Payer: Self-pay | Admitting: Podiatry

## 2020-10-31 DIAGNOSIS — L989 Disorder of the skin and subcutaneous tissue, unspecified: Secondary | ICD-10-CM | POA: Diagnosis not present

## 2020-11-04 ENCOUNTER — Encounter: Payer: Self-pay | Admitting: Podiatry

## 2020-11-04 NOTE — Progress Notes (Signed)
Subjective:  Patient ID: Michelle Miranda, female    DOB: 01/20/1951,  MRN: [id number]  Chief Complaint  Patient presents with  . Callouses    Right hallux has a possible callus on the bottom of the toe    70 y.o. female presents with the above complaint.  Patient presents with complaint of bilateral benign skin lesion.  Patient states is painful to touch.  She states that it hurts with ambulation.  She has tried some over-the-counter debridement but none of which has helped.  She would like for me to debride them down.  She is denies seeing anyone else prior to seeing me.  She is try to get reestablished to our practice.  She was last seen by Dr. Prudence Davidson more than 3 years ago.  She denies doing anything else for it.   Review of Systems: Negative except as noted in the HPI. Denies N/V/F/Ch.  Past Medical History:  Diagnosis Date  . Anxiety    occasional  . Arthritis   . Depression   . Hyperlipidemia   . Hypertension   . Hypothyroid    hypo thyroid  . Osteopenia   . Urinary tract infection 12/15/2011   once  . Vitamin D deficiency     Current Outpatient Medications:  .  alendronate (FOSAMAX) 70 MG tablet, Take 1 tablet (70 mg total) by mouth every 7 (seven) days. Take with a full glass of water on an empty stomach., Disp: 12 tablet, Rfl: 3 .  ALPRAZolam (XANAX) 0.5 MG tablet, Take 1/2-1 tab up to three times in a day for severe anxiety or panic attacks. Avoid taking daily due to addictive nature, limit to <5 days/week, Disp: 60 tablet, Rfl: 0 .  ARMOUR THYROID 90 MG tablet, Take 0.5 tablets (45 mg total) by mouth daily. ALTERNATIVE WILL NOT BE SENT. PLEASE FILL RX ASAP, Disp: 45 tablet, Rfl: 3 .  ascorbic acid (VITAMIN C) 500 MG tablet, Take 500 mg by mouth daily., Disp: , Rfl:  .  Cholecalciferol (VITAMIN D3) 5000 units CAPS, Take by mouth., Disp: , Rfl:  .  Cyanocobalamin (B12 LIQUID HEALTH BOOSTER PO), Take by mouth daily., Disp: , Rfl:  .  escitalopram (LEXAPRO) 5 MG/5ML  solution, START TAKING 10ML DAILY INSTEAD OF TABLET. THEN IN 2-4 WEEKS REDUCE TO 9ML DAILY X 2 WEEKS. CONTINUE TO REDUCE BY 1ML EVERY 2-4 WEEKS UNTIL DONE, Disp: 240 mL, Rfl: 3 .  fluticasone (FLONASE) 50 MCG/ACT nasal spray, Place 1-2 sprays into both nostrils daily as needed for allergies or rhinitis., Disp: 16 g, Rfl: 2 .  MAGNESIUM GLUCONATE PO, Take by mouth 2 (two) times daily., Disp: , Rfl:  .  MELATONIN PO, Take 5 mg by mouth daily as needed., Disp: , Rfl:  .  Multiple Vitamin (MULTIVITAMIN) tablet, Take 1 tablet by mouth daily., Disp: , Rfl:   Social History   Tobacco Use  Smoking Status Never Smoker  Smokeless Tobacco Never Used    Allergies  Allergen Reactions  . Oxycodone     "Found Oxycodone not effective for pain control" with last RTKA  . Other     Opiods,causes severe anxiety   Objective:  There were no vitals filed for this visit. There is no height or weight on file to calculate BMI. Constitutional Well developed. Well nourished.  Vascular Dorsalis pedis pulses palpable bilaterally. Posterior tibial pulses palpable bilaterally. Capillary refill normal to all digits.  No cyanosis or clubbing noted. Pedal hair growth normal.  Neurologic Normal speech.  Oriented to person, place, and time. Epicritic sensation to light touch grossly present bilaterally.  Dermatologic  hyperkeratotic lesion without central nucleated core noted to bilateral hallux IPJ.  Pain on palpation to the lesion.  No pinpoint bleeding noted.  Orthopedic: Normal joint ROM without pain or crepitus bilaterally. No visible deformities. No bony tenderness.   Radiographs: None Assessment:   1. Benign skin lesion    Plan:  Patient was evaluated and treated and all questions answered.  Bilateral hallux IPJ benign skin lesion -I explained to the patient the etiology of skin lesion versus treatment options were extensively discussed.  The skin lesion was debrided down to healthy striated tissue  using chisel blade and a handle.  No complication noted no pinpoint bleeding noted. -Toe protectors were dispensed to take the pressure away from it.  No follow-ups on file.

## 2020-11-17 ENCOUNTER — Encounter: Payer: Self-pay | Admitting: Adult Health

## 2020-11-17 DIAGNOSIS — F411 Generalized anxiety disorder: Secondary | ICD-10-CM | POA: Diagnosis not present

## 2020-11-17 NOTE — Progress Notes (Signed)
AWV and 3 MONTH FOLLOW UP  Assessment:     Encounter for annual Medicare exam Due annually   Essential hypertension Borderline above goal; discussed initiating medication today She declines at this time, plans to implement some lifestyle changes DASH diet, exercise and start monitoring at home.  Call if greater than 130/80.  - CBC with Differential/Platelet - CMP/GFR  Other specified hypothyroidism Managed by [name], now on armour thyroid and much improved.  Continue medications the same, reminded to take on an empty stomach 30-55mins before food.  - recent TSH, defer   Hyperlipidemia -continue medications, check lipids, decrease fatty foods, increase activity.  - Lipid panelt  Depression, major, active ([place]) - with anxiety and grief reaction Lexapro liquid with slow taper Following with psych and counseling due to med intolerance Lifestyle discussed: diet/exerise, sleep hygiene, stress management, hydration  Other abnormal glucose Discussed general issues about diabetes pathophysiology and management., Educational material distributed., Suggested low cholesterol diet., Encouraged aerobic exercise., Discussed foot care., Reminded to get yearly retinal exam. Hemoglobin A1c q8m; monitor weight, serum glucose   Vitamin D deficiency At goal at recent check; continue to recommend supplementation for goal of 70-100 Check vitamin D   Osteoarthritis of both knees, unspecified osteoarthritis type Continue weight loss, better after surgery   Osteopenia Continue with newly prescribed fosamax Repeat DEXA 2 years - 08/2022 [name] following as well  continue Vit D and Ca, weight bearing exercises  Medication management CBC, CMP/GFR  BMI 25 Continue to recommend diet heavy in fruits and veggies and low in animal meats, cheeses, and dairy products, appropriate calorie intake Discuss exercise recommendations routinely Continue to monitor weight at each visit   Orders  Placed This Encounter  Procedures  . Folate RBC  . CBC with Differential/Platelet  . COMPLETE METABOLIC PANEL WITH GFR  . Magnesium  . Lipid panel    Over 40 minutes of exam, counseling, chart review and critical decision making was performed Future Appointments  Date Time Provider [place]  02/19/2021  2:40 PM Philemon Kingdom, MD [facility] None  02/24/2021  2:30 PM Unk Pinto, MD [facility] None  08/13/2021  2:00 PM Liane Comber, NP [facility] None  10/30/2021  2:20 PM Philemon Kingdom, MD [facility] None  11/19/2021  2:30 PM Liane Comber, NP [facility] None     Plan:   During the course of the visit the patient was educated and counseled about appropriate screening and preventive services including:    Pneumococcal vaccine   Prevnar 13  Influenza vaccine  Td vaccine  Screening electrocardiogram  Bone densitometry screening  Colorectal cancer screening  Diabetes screening  Glaucoma screening  Nutrition counseling   Advanced directives: requested   Subjective:  Michelle Miranda is a 70 y.o. female who presents for AWV. She has OA (osteoarthritis) of knee; Hyperlipidemia; Hypertension; Hypothyroidism due to Hashimoto's thyroiditis; Vitamin D deficiency; Other abnormal glucose; Osteoporosis; Moderate episode of recurrent major depressive disorder ([place]); Anxiety; and CKD (chronic kidney disease) stage 2, GFR 60-89 ml/min on their problem list.   First grandchild (girl) arrived in 2019, doing well, watches her during the day a few days a week. She is retired Barista.  The patient notably lost her husband to cancer abruptly early 2021, has had recurrent depression/anxiety, grief reaction, severe anxiety and panic attacks on cymbalta 60 mg which she had been on for many years for depression. Transitioned to lexapro but was evaluated 05/21/2020 in ED for AMS, transient confusion/disorientation, unremarkable lab/imaging. There  was concern for possible med reaction due to newly on lexapro/SS, and was advised taper to stop. Note she was fully off of cymbalta for 4 weeks, on lexapro only at time of AMS event. She is also prescribed xanax which she is trying to reduce/avoid daily dosing. Per her strong request we are doing a very slow lexapro taper using liquid and reducing very slowly. She is doing grief counseling which has been helpful. She is now following with Debby Bud, NP, did genomic testing and apparently numerous intolerances. Also following with a counselor with perceived benefit.   She did see Dr. Jannifer Franklin who was in agreement, with a small concern for possible mild serotonin syndrome, agreed with taper off of lexapro, EEG was normal 06/09/2020.   She was on evista  for osteoporosis for many years but with progression of osteoporosis, has fosamax and planning to start after discussion with [name]; + vaginal dryness but albolene helps.   BMI is Body mass index is 25.85 kg/m., she has been working on diet and exercise, walking 2 days a week with granddaughter in stroller, 30 min. Admits poor appetite but making sure she eats, avoiding empty calories, no desserts.  Wt Readings from Last 3 Encounters:  11/19/20 128 lb (58.1 kg)  10/24/20 126 lb 9.6 oz (57.4 kg)  08/13/20 132 lb (59.9 kg)   She has not been checking BP at home, today their BP is BP: 106/64 She does workout. She denies chest pain, shortness of breath, dizziness.   She is not on cholesterol medication and denies myalgias. Her cholesterol is at goal. The cholesterol last visit was:   Lab Results  Component Value Date   CHOL 175 08/13/2020   HDL 73 08/13/2020   LDLCALC 87 08/13/2020   TRIG 63 08/13/2020   CHOLHDL 2.4 08/13/2020    She has not been working on diet and exercise for glucose management, and denies increased appetite, nausea, paresthesia of the feet, polydipsia, polyuria and visual disturbances. Last A1C in the office was:  Lab  Results  Component Value Date   HGBA1C 5.7 (H) 08/13/2020   She is on thyroid medication, now following with [name], was switched to armour thyroid and per patient feeling much improved. Her medication was not changed last visit.   Lab Results  Component Value Date   TSH 1.86 10/27/2020   Last GFR: Lab Results  Component Value Date   GFRNONAA 69 08/13/2020   Patient is on Vitamin D supplement.   Lab Results  Component Value Date   VD25OH 79 08/13/2020     She is on B12 supplement;  Lab Results  Component Value Date   ACZYSAYT01 6,010 (H) 08/13/2020     Medication Review: Current Outpatient Medications on File Prior to Visit  Medication Sig Dispense Refill  . ALPRAZolam (XANAX) 0.5 MG tablet Take 1/2-1 tab up to three times in a day for severe anxiety or panic attacks. Avoid taking daily due to addictive nature, limit to <5 days/week 60 tablet 0  . ARMOUR THYROID 90 MG tablet Take 0.5 tablets (45 mg total) by mouth daily. ALTERNATIVE WILL NOT BE SENT. PLEASE FILL RX ASAP 45 tablet 3  . ascorbic acid (VITAMIN C) 500 MG tablet Take 500 mg by mouth daily.    . Cholecalciferol (VITAMIN D3) 5000 units CAPS Take by mouth.    . Cyanocobalamin (B12 LIQUID HEALTH BOOSTER PO) Take by mouth daily.    Marland Kitchen escitalopram (LEXAPRO) 5 MG/5ML solution START TAKING 10ML  DAILY INSTEAD OF TABLET. THEN IN 2-4 WEEKS REDUCE TO 9ML DAILY X 2 WEEKS. CONTINUE TO REDUCE BY 1ML EVERY 2-4 WEEKS UNTIL DONE 240 mL 3  . MAGNESIUM GLUCONATE PO Take by mouth 2 (two) times daily.    Marland Kitchen MELATONIN PO Take 5 mg by mouth daily as needed.    Marland Kitchen alendronate (FOSAMAX) 70 MG tablet Take 1 tablet (70 mg total) by mouth every 7 (seven) days. Take with a full glass of water on an empty stomach. (Patient not taking: Reported on 11/19/2020) 12 tablet 3  . Multiple Vitamin (MULTIVITAMIN) tablet Take 1 tablet by mouth daily. (Patient not taking: Reported on 11/19/2020)     No current facility-administered medications on file  prior to visit.    Allergies  Allergen Reactions  . Oxycodone     "Found Oxycodone not effective for pain control" with last RTKA  . Other     Opiods,causes severe anxiety    Current Problems (verified) Patient Active Problem List   Diagnosis Date Noted  . CKD (chronic kidney disease) stage 2, GFR 60-89 ml/min 08/14/2020  . Anxiety   . Moderate episode of recurrent major depressive disorder ([place]) 03/27/2020  . Other abnormal glucose 05/19/2015  . Osteoporosis 05/19/2015  . Hyperlipidemia   . Hypertension   . Hypothyroidism due to Hashimoto's thyroiditis   . Vitamin D deficiency   . OA (osteoarthritis) of knee 01/03/2012    Screening Tests Immunization History  Administered Date(s) Administered  . Fluad Quad(high Dose 65+) 04/12/2019  . Influenza Inj Mdck Quad Pf 05/23/2017  . Influenza Split 05/10/2014, 05/19/2015  . Influenza, High Dose Seasonal PF 07/02/2018, 08/14/2018  . Influenza,inj,Quad PF,6+ Mos 03/23/2016  . Influenza-Unspecified 04/12/2019, 07/07/2020  . PFIZER(Purple Top)SARS-COV-2 Vaccination 09/13/2019, 10/08/2019, 07/07/2020  . Pneumococcal Conjugate-13 07/09/2019  . Pneumococcal Polysaccharide-23 03/14/2018  . Td 06/02/2006  . Tdap 03/14/2018   Tetanus: 2007, 2019 Pneumovax: 03/2018  Prevnar 13: 07/2019 Flu vaccine: 05/2020 Zostavax: declines Covid 19: 3/3, 2021, pfizer  Pap: 03/2018, never abnormal, HPV neg, DONE MGM: 09/2020 DEXA: 08/2020, T-2.6 L fem, [name] is following, did recommend the fosamax that we prescribed for 2 years  Colonoscopy: 01/2019 Dr. Henrene Pastor, 10 year follow up EGD: N/A CTA 09/2012  Names of Other Physician/Practitioners you currently use: 1. [facility] here for primary care 2. Vision: Dr. Bing Plume, last visit 2021, has upcoming 3. Dental: Dr. Marquette Saa, last visit 10/2020, looking for a new dentist  4. Derm: Dr. Adah Salvage office, new provider, last 2022 total body check   Patient Care  Team: Unk Pinto, [name] [name], MD as Consulting Physician (Orthopedic Surgery) [name], MD as Consulting Physician (Ophthalmology) Irene Shipper, MD as Consulting Physician (Gastroenterology) Philemon Kingdom, MD as Consulting Physician (Internal Medicine) Garnet Sierras, NP as Nurse Practitioner (Adult Health Nurse Practitioner)  SURGICAL HISTORY She  has a past surgical history that includes c sections (jan 1980 and march 1984); ovarian cyst with lararoscopy march 1998 (1998); Total knee arthroplasty (01/03/2012); Wisdom tooth extraction (09-05-12); Total knee arthroplasty (Left, 09/11/2012); Cataract extraction, bilateral (Bilateral, 2016); and Colonoscopy. FAMILY HISTORY Her family history includes Arthritis in her mother; Breast cancer in her maternal grandmother and paternal grandmother; COPD in her father; Cancer in her mother; Hypertension in her mother; Stroke (age of onset: 39) in her maternal grandmother; Thyroid disease in her mother. SOCIAL HISTORY She  reports that she has never smoked. She has never used smokeless tobacco.  She reports previous alcohol use. She reports that she does not use drugs.   MEDICARE WELLNESS OBJECTIVES: Physical activity: Current Exercise Habits: Home exercise routine, Type of exercise: walking, Time (Minutes): 30, Frequency (Times/Week): 2, Weekly Exercise (Minutes/Week): 60, Intensity: Mild, Exercise limited by: None identified Cardiac risk factors: Cardiac Risk Factors include: advanced age (>76men, >62 women);dyslipidemia;hypertension Depression/mood screen:   Depression screen [facility] 2/9 11/19/2020  Decreased Interest 2  Down, Depressed, Hopeless 1  PHQ - 2 Score 3  Altered sleeping 0  Tired, decreased energy 1  Change in appetite 0  Feeling bad or failure about yourself  0  Trouble concentrating 1  Moving slowly or fidgety/restless 0  Suicidal thoughts 0  PHQ-9 Score 5  Difficult doing  work/chores Somewhat difficult    ADLs:  In your present state of health, do you have any difficulty performing the following activities: 11/19/2020  Hearing? N  Vision? N  Difficulty concentrating or making decisions? N  Walking or climbing stairs? N  Dressing or bathing? N  Doing errands, shopping? N  Some recent data might be hidden     Cognitive Testing  Alert? Yes  Normal Appearance?Yes  Oriented to person? Yes  Place? Yes   Time? Yes  Recall of three objects?  Yes  Can perform simple calculations? Yes  Displays appropriate judgment?Yes  Can read the correct time from a watch face?Yes  EOL planning: Does Patient Have a Medical Advance Directive?: No Would patient like information on creating a medical advance directive?: No - Patient declined    Review of Systems  Constitutional: Negative for malaise/fatigue and weight loss.  HENT: Negative for hearing loss and tinnitus.   Eyes: Negative for blurred vision and double vision.  Respiratory: Negative for cough, sputum production, shortness of breath and wheezing.   Cardiovascular: Negative for chest pain, palpitations, orthopnea, claudication, leg swelling and PND.  Gastrointestinal: Negative for abdominal pain, blood in stool, constipation, diarrhea, heartburn, melena, nausea and vomiting.  Genitourinary: Negative.   Musculoskeletal: Negative for falls, joint pain and myalgias.  Skin: Negative for rash.  Neurological: Negative for dizziness, tingling, sensory change, weakness and headaches.  Endo/Heme/Allergies: Negative for polydipsia.  Psychiatric/Behavioral: Positive for depression. Negative for memory loss, substance abuse and suicidal ideas. The patient is not nervous/anxious and does not have insomnia.   All other systems reviewed and are negative.    Objective:     Today's Vitals   11/19/20 1430  BP: 106/64  Pulse: 78  Temp: 97.7 F (36.5 C)  SpO2: 95%  Weight: 128 lb (58.1 kg)  Height: 4\' 11"  (1.499 m)    Body mass index is 25.85 kg/m.  General appearance: alert, no distress, WD/WN, female HEENT: normocephalic, sclerae anicteric, TMs pearly, nares patent, no discharge or erythema, pharynx normal Oral cavity: MMM, no lesions Neck: supple, no lymphadenopathy, no thyromegaly, no masses Heart: RRR, normal S1, S2, no murmurs Lungs: CTA bilaterally, no wheezes, rhonchi, or rales Abdomen: +bs, soft, non tender, non distended, no masses, no hepatomegaly, no splenomegaly Musculoskeletal: nontender, no swelling, no obvious deformity Extremities: no edema, no cyanosis, no clubbing Pulses: 2+ symmetric, upper and lower extremities, normal cap refill Neurological: alert, oriented x 3, CN2-12 intact, strength normal upper extremities and lower extremities, sensation normal throughout, DTRs 2+ throughout, no cerebellar signs, gait normal Psychiatric: normal affect, behavior normal, pleasant   Medicare Attestation I have personally reviewed: The patient's medical and social history Their use of alcohol, tobacco or illicit drugs Their current medications and supplements  The patient's functional ability including ADLs,fall risks, home safety risks, cognitive, and hearing and visual impairment Diet and physical activities Evidence for depression or mood disorders  The patient's weight, height, BMI, and visual acuity have been recorded in the chart.  I have made referrals, counseling, and provided education to the patient based on review of the above and I have provided the patient with a written personalized care plan for preventive services.     Izora Ribas, NP   11/19/2020

## 2020-11-19 ENCOUNTER — Other Ambulatory Visit: Payer: Self-pay

## 2020-11-19 ENCOUNTER — Ambulatory Visit (INDEPENDENT_AMBULATORY_CARE_PROVIDER_SITE_OTHER): Payer: Medicare Other | Admitting: Adult Health

## 2020-11-19 ENCOUNTER — Encounter: Payer: Self-pay | Admitting: Adult Health

## 2020-11-19 VITALS — BP 106/64 | HR 78 | Temp 97.7°F | Ht 59.0 in | Wt 128.0 lb

## 2020-11-19 DIAGNOSIS — R6889 Other general symptoms and signs: Secondary | ICD-10-CM

## 2020-11-19 DIAGNOSIS — E063 Autoimmune thyroiditis: Secondary | ICD-10-CM

## 2020-11-19 DIAGNOSIS — F331 Major depressive disorder, recurrent, moderate: Secondary | ICD-10-CM | POA: Diagnosis not present

## 2020-11-19 DIAGNOSIS — Z0000 Encounter for general adult medical examination without abnormal findings: Secondary | ICD-10-CM

## 2020-11-19 DIAGNOSIS — E785 Hyperlipidemia, unspecified: Secondary | ICD-10-CM

## 2020-11-19 DIAGNOSIS — Z0001 Encounter for general adult medical examination with abnormal findings: Secondary | ICD-10-CM | POA: Diagnosis not present

## 2020-11-19 DIAGNOSIS — Z6825 Body mass index (BMI) 25.0-25.9, adult: Secondary | ICD-10-CM

## 2020-11-19 DIAGNOSIS — E038 Other specified hypothyroidism: Secondary | ICD-10-CM

## 2020-11-19 DIAGNOSIS — D649 Anemia, unspecified: Secondary | ICD-10-CM

## 2020-11-19 DIAGNOSIS — E559 Vitamin D deficiency, unspecified: Secondary | ICD-10-CM | POA: Diagnosis not present

## 2020-11-19 DIAGNOSIS — R7309 Other abnormal glucose: Secondary | ICD-10-CM

## 2020-11-19 DIAGNOSIS — J019 Acute sinusitis, unspecified: Secondary | ICD-10-CM | POA: Diagnosis not present

## 2020-11-19 DIAGNOSIS — I10 Essential (primary) hypertension: Secondary | ICD-10-CM | POA: Diagnosis not present

## 2020-11-19 DIAGNOSIS — M810 Age-related osteoporosis without current pathological fracture: Secondary | ICD-10-CM | POA: Diagnosis not present

## 2020-11-19 DIAGNOSIS — M170 Bilateral primary osteoarthritis of knee: Secondary | ICD-10-CM

## 2020-11-19 DIAGNOSIS — N182 Chronic kidney disease, stage 2 (mild): Secondary | ICD-10-CM | POA: Diagnosis not present

## 2020-11-19 MED ORDER — FLUTICASONE PROPIONATE 50 MCG/ACT NA SUSP: 1.0000 | Freq: Every day | NASAL | 2 refills | Status: DC | PRN

## 2020-11-20 LAB — COMPLETE METABOLIC PANEL WITH GFR
AG Ratio: 1.5 (calc) (ref 1.0–2.5)
ALT: 23 U/L (ref 6–29)
AST: 22 U/L (ref 10–35)
Albumin: 4.1 g/dL (ref 3.6–5.1)
Alkaline phosphatase (APISO): 84 U/L (ref 37–153)
BUN: 11 mg/dL (ref 7–25)
CO2: 29 mmol/L (ref 20–32)
Calcium: 9.3 mg/dL (ref 8.6–10.4)
Chloride: 102 mmol/L (ref 98–110)
Creat: 0.68 mg/dL (ref 0.50–0.99)
GFR, Est African American: 105 mL/min/{1.73_m2} (ref >=60)
GFR, Est Non African American: 91 mL/min/{1.73_m2} (ref >=60)
Globulin: 2.8 g/dL (calc) (ref 1.9–3.7)
Glucose, Bld: 71 mg/dL (ref 65–99)
Potassium: 4.5 mmol/L (ref 3.5–5.3)
Sodium: 141 mmol/L (ref 135–146)
Total Bilirubin: 0.7 mg/dL (ref 0.2–1.2)
Total Protein: 6.9 g/dL (ref 6.1–8.1)

## 2020-11-20 LAB — CBC WITH DIFFERENTIAL/PLATELET
Absolute Monocytes: 884 cells/uL (ref 200–950)
Basophils Absolute: 47 cells/uL (ref 0–200)
Basophils Relative: 0.5 %
Eosinophils Absolute: 112 cells/uL (ref 15–500)
Eosinophils Relative: 1.2 %
HCT: 40.8 % (ref 35.0–45.0)
Hemoglobin: 13.7 g/dL (ref 11.7–15.5)
Lymphs Abs: 2799 cells/uL (ref 850–3900)
MCH: 31 pg (ref 27.0–33.0)
MCHC: 33.6 g/dL (ref 32.0–36.0)
MCV: 92.3 fL (ref 80.0–100.0)
MPV: 9.9 fL (ref 7.5–12.5)
Monocytes Relative: 9.5 %
Neutro Abs: 5459 cells/uL (ref 1500–7800)
Neutrophils Relative %: 58.7 %
Platelets: 279 10*3/uL (ref 140–400)
RBC: 4.42 10*6/uL (ref 3.80–5.10)
RDW: 12.1 % (ref 11.0–15.0)
Total Lymphocyte: 30.1 %
WBC: 9.3 10*3/uL (ref 3.8–10.8)

## 2020-11-20 LAB — LIPID PANEL
Cholesterol: 174 mg/dL (ref <200)
HDL: 75 mg/dL (ref >=50)
LDL Cholesterol (Calc): 85 mg/dL (calc)
Non-HDL Cholesterol (Calc): 99 mg/dL (calc) (ref <130)
Total CHOL/HDL Ratio: 2.3 (calc) (ref <5.0)
Triglycerides: 55 mg/dL (ref <150)

## 2020-11-20 LAB — MAGNESIUM: Magnesium: 2.3 mg/dL (ref 1.5–2.5)

## 2020-11-26 ENCOUNTER — Other Ambulatory Visit: Payer: Self-pay | Admitting: Internal Medicine

## 2020-11-26 ENCOUNTER — Ambulatory Visit: Payer: Medicare Other | Admitting: Podiatry

## 2020-11-26 DIAGNOSIS — E038 Other specified hypothyroidism: Secondary | ICD-10-CM

## 2020-12-01 DIAGNOSIS — F411 Generalized anxiety disorder: Secondary | ICD-10-CM | POA: Diagnosis not present

## 2020-12-04 DIAGNOSIS — F411 Generalized anxiety disorder: Secondary | ICD-10-CM | POA: Diagnosis not present

## 2020-12-09 ENCOUNTER — Other Ambulatory Visit: Payer: Self-pay | Admitting: Adult Health Nurse Practitioner

## 2020-12-09 DIAGNOSIS — F331 Major depressive disorder, recurrent, moderate: Secondary | ICD-10-CM

## 2020-12-10 ENCOUNTER — Other Ambulatory Visit: Payer: Medicare Other

## 2020-12-10 ENCOUNTER — Other Ambulatory Visit: Payer: Self-pay

## 2020-12-10 DIAGNOSIS — D649 Anemia, unspecified: Secondary | ICD-10-CM

## 2020-12-12 LAB — FOLATE RBC: RBC Folate: 501 ng/mL RBC (ref >280)

## 2020-12-15 DIAGNOSIS — F411 Generalized anxiety disorder: Secondary | ICD-10-CM | POA: Diagnosis not present

## 2020-12-26 DIAGNOSIS — Z96653 Presence of artificial knee joint, bilateral: Secondary | ICD-10-CM | POA: Diagnosis not present

## 2021-01-01 DIAGNOSIS — F411 Generalized anxiety disorder: Secondary | ICD-10-CM | POA: Diagnosis not present

## 2021-01-06 DIAGNOSIS — F411 Generalized anxiety disorder: Secondary | ICD-10-CM | POA: Diagnosis not present

## 2021-02-05 DIAGNOSIS — F411 Generalized anxiety disorder: Secondary | ICD-10-CM | POA: Diagnosis not present

## 2021-02-12 DIAGNOSIS — F411 Generalized anxiety disorder: Secondary | ICD-10-CM | POA: Diagnosis not present

## 2021-02-19 ENCOUNTER — Ambulatory Visit: Payer: Medicare Other | Admitting: Internal Medicine

## 2021-02-24 ENCOUNTER — Ambulatory Visit: Payer: Medicare Other | Admitting: Internal Medicine

## 2021-02-25 DIAGNOSIS — Z23 Encounter for immunization: Secondary | ICD-10-CM | POA: Diagnosis not present

## 2021-03-06 DIAGNOSIS — F411 Generalized anxiety disorder: Secondary | ICD-10-CM | POA: Diagnosis not present

## 2021-03-23 ENCOUNTER — Ambulatory Visit: Payer: Medicare Other | Admitting: Adult Health

## 2021-03-26 ENCOUNTER — Encounter: Payer: Self-pay | Admitting: Adult Health

## 2021-03-26 ENCOUNTER — Ambulatory Visit (INDEPENDENT_AMBULATORY_CARE_PROVIDER_SITE_OTHER): Payer: Medicare Other | Admitting: Adult Health

## 2021-03-26 ENCOUNTER — Other Ambulatory Visit: Payer: Self-pay

## 2021-03-26 VITALS — BP 110/64 | HR 70 | Temp 97.7°F | Ht 59.0 in | Wt 128.8 lb

## 2021-03-26 DIAGNOSIS — R7309 Other abnormal glucose: Secondary | ICD-10-CM

## 2021-03-26 DIAGNOSIS — E063 Autoimmune thyroiditis: Secondary | ICD-10-CM

## 2021-03-26 DIAGNOSIS — I10 Essential (primary) hypertension: Secondary | ICD-10-CM

## 2021-03-26 DIAGNOSIS — N182 Chronic kidney disease, stage 2 (mild): Secondary | ICD-10-CM | POA: Diagnosis not present

## 2021-03-26 DIAGNOSIS — E038 Other specified hypothyroidism: Secondary | ICD-10-CM

## 2021-03-26 DIAGNOSIS — E559 Vitamin D deficiency, unspecified: Secondary | ICD-10-CM

## 2021-03-26 DIAGNOSIS — F411 Generalized anxiety disorder: Secondary | ICD-10-CM | POA: Diagnosis not present

## 2021-03-26 DIAGNOSIS — E785 Hyperlipidemia, unspecified: Secondary | ICD-10-CM

## 2021-03-26 NOTE — Progress Notes (Signed)
3 MONTH FOLLOW UP  Assessment:    Essential hypertension Well controlled with lifestyle DASH diet, exercise and start monitoring at home.  Call if greater than 130/80.  - CBC with Differential/Platelet - CMP/GFR  Other specified hypothyroidism Managed by [name], now on armour thyroid and much improved.  Continue medications the same, reminded to take on an empty stomach 30-60mns before food.  - TSH  Hyperlipidemia Hx of mild elevations Recently well controlled by lifestyle Check levels annually   Depression, major, active ([place] - with anxiety and grief reaction Lexapro liquid with slow taper Following with psych and counseling due to med intolerance Lifestyle discussed: diet/exerise, sleep hygiene, stress management, hydration  Other abnormal glucose Discussed general issues about diabetes pathophysiology and management., Educational material distributed., Suggested low cholesterol diet., Encouraged aerobic exercise., Discussed foot care., Reminded to get yearly retinal exam. Hemoglobin A1c q612m   Vitamin D deficiency continue to recommend supplementation for goal of 60-100 Check vitamin D   Medication management CBC, CMP/GFR  BMI 25 Continue to recommend diet heavy in fruits and veggies and low in animal meats, cheeses, and dairy products, appropriate calorie intake Discuss exercise recommendations routinely Continue to monitor weight at each visit  Orders Placed This Encounter  Procedures   CBC with Differential/Platelet   COMPLETE METABOLIC PANEL WITH GFR   TSH   Hemoglobin A1c   VITAMIN D 25 Hydroxy (Vit-D Deficiency, Fractures)   Over 30 minutes of exam, counseling, chart review and critical decision making was performed Future Appointments  Date Time Provider [place]1/07/2022  2:00 PM CoLiane ComberNP [facility] None  10/30/2021  2:20 PM GhPhilemon KingdomMD [facility] None  11/19/2021  2:30 PM CoLiane ComberNP [facility] None     Subjective:  Michelle Miranda a [age].o. female who presents for 3 month follow up.   She has xx of recurrent depression/anxiety on cymbalta for many years; she abruptly lost husband to cancer in [id number] complicated grief, severe panic attacks with excess xanax use. We attempted taper off of cymbalta to try lexapro, but was evaluated 05/21/2020 in ED for AMS, unremarkable workup with concern for SS, was advised to taper to stop. She is now following with KaDebby BudNP, did genomic testing and apparently numerous intolerances. Also following with a counselor with perceived benefit. Per her strong request she is on lexapro liquid formulation and doing very slow taper and does feel doing well with this.   She has osteoporosis per DEXA 08/2019 with L hip T -2.6. She declined fosamax at this time, working on lifestyle. Doing pilates twice weekly.   BMI is Body mass index is 26.01 kg/m., she has been working on diet and exercise. Wt Readings from Last 3 Encounters:  03/26/21 128 lb 12.8 oz (58.4 kg)  11/19/20 128 lb (58.1 kg)  10/24/20 126 lb 9.6 oz (57.4 kg)   She has not been checking BP at home, today their BP is BP: 110/64 She does workout. She denies chest pain, shortness of breath, dizziness.   She is not on cholesterol medication and denies myalgias. Her cholesterol is at goal. The cholesterol last visit was:   Lab Results  Component Value Date   CHOL 174 11/19/2020   HDL 75 11/19/2020   LDLCALC 85 11/19/2020   TRIG 55 11/19/2020   CHOLHDL 2.3 11/19/2020    She has not been working on diet and exercise for glucose management, and denies increased appetite, nausea, paresthesia of the  feet, polydipsia, polyuria and visual disturbances. Last A1C in the office was:  Lab Results  Component Value Date   HGBA1C 5.7 (H) 08/13/2020   She is on thyroid medication, now following with [name], was switched to armour thyroid and per patient feeling much improved. Her  medication was not changed last visit.   Lab Results  Component Value Date   TSH 1.86 10/27/2020   Last GFR: Lab Results  Component Value Date   GFRNONAA 91 11/19/2020   Patient is on Vitamin D supplement.   Lab Results  Component Value Date   VD25OH 79 08/13/2020     She is on B12 supplement;  Lab Results  Component Value Date   VITAMINB12 1,322 (H) 08/13/2020     Medication Review: Current Outpatient Medications on File Prior to Visit  Medication Sig Dispense Refill   ARMOUR THYROID 90 MG tablet TAKE 1/2 TABLET BY MOUTH DAILY 45 tablet 3   ascorbic acid (VITAMIN C) 500 MG tablet Take 500 mg by mouth daily.     Cholecalciferol (VITAMIN D3) 5000 units CAPS Take by mouth.     Cyanocobalamin (B12 LIQUID HEALTH BOOSTER PO) Take by mouth daily.     escitalopram (LEXAPRO) 5 MG/5ML solution TAKE 1-10 ML BY MOUTH DAILY INSTEAD OF TABLETS AS PER SLOW TAPER INSTRUCTIONS, REDUCING DOSE BY 1 ML EVERY 2-4 WEEKS. 240 mL 3   fluticasone (FLONASE) 50 MCG/ACT nasal spray Place 1-2 sprays into both nostrils daily as needed for allergies or rhinitis. 16 g 2   MAGNESIUM GLUCONATE PO Take by mouth 2 (two) times daily.     MELATONIN PO Take 5 mg by mouth daily as needed.     alendronate (FOSAMAX) 70 MG tablet Take 1 tablet (70 mg total) by mouth every 7 (seven) days. Take with a full glass of water on an empty stomach. 12 tablet 3   ALPRAZolam (XANAX) 0.5 MG tablet Take 1/2-1 tab up to three times in a day for severe anxiety or panic attacks. Avoid taking daily due to addictive nature, limit to <5 days/week 60 tablet 0   Multiple Vitamin (MULTIVITAMIN) tablet Take 1 tablet by mouth daily. (Patient not taking: No sig reported)     No current facility-administered medications on file prior to visit.    Allergies  Allergen Reactions   Oxycodone     "Found Oxycodone not effective for pain control" with last RTKA   Other     Opiods,causes severe anxiety    Current Problems (verified) Patient  Active Problem List   Diagnosis Date Noted   CKD (chronic kidney disease) stage 2, GFR 60-89 ml/min 08/14/2020   Anxiety    Moderate episode of recurrent major depressive disorder ([place]) 03/27/2020   Other abnormal glucose 05/19/2015   Osteoporosis 05/19/2015   Hyperlipidemia    Hypertension    Hypothyroidism due to Hashimoto's thyroiditis    Vitamin D deficiency    OA (osteoarthritis) of knee 01/03/2012    SURGICAL HISTORY She  has a past surgical history that includes c sections (jan 1980 and march 1984); ovarian cyst with lararoscopy march 1998 (1998); Total knee arthroplasty (01/03/2012); Wisdom tooth extraction (09-05-12); Total knee arthroplasty (Left, 09/11/2012); Cataract extraction, bilateral (Bilateral, 2016); and Colonoscopy. FAMILY HISTORY Her family history includes Arthritis in her mother; Breast cancer in her maternal grandmother and paternal grandmother; COPD in her father; Cancer in her mother; Hypertension in her mother; Stroke (age of onset: 9) in her maternal grandmother; Thyroid disease in her mother.  SOCIAL HISTORY She  reports that she has never smoked. She has never used smokeless tobacco. She reports that she does not currently use alcohol. She reports that she does not use drugs.   Review of Systems  Constitutional:  Negative for malaise/fatigue and weight loss.  HENT:  Negative for hearing loss and tinnitus.   Eyes:  Negative for blurred vision and double vision.  Respiratory:  Negative for cough, sputum production, shortness of breath and wheezing.   Cardiovascular:  Negative for chest pain, palpitations, orthopnea, claudication, leg swelling and PND.  Gastrointestinal:  Negative for abdominal pain, blood in stool, constipation, diarrhea, heartburn, melena, nausea and vomiting.  Genitourinary: Negative.   Musculoskeletal:  Negative for falls, joint pain and myalgias.  Skin:  Negative for rash.  Neurological:  Negative for dizziness, tingling, sensory change,  weakness and headaches.  Endo/Heme/Allergies:  Negative for polydipsia.  Psychiatric/Behavioral:  Negative for depression, memory loss, substance abuse and suicidal ideas. The patient is not nervous/anxious and does not have insomnia.   All other systems reviewed and are negative.   Objective:     Today's Vitals   03/26/21 1618  BP: 110/64  Pulse: 70  Temp: 97.7 F (36.5 C)  SpO2: 99%  Weight: 128 lb 12.8 oz (58.4 kg)  Height: '4\' 11"'$  (1.499 m)   Body mass index is 26.01 kg/m.  General appearance: alert, no distress, WD/WN, female HEENT: normocephalic, sclerae anicteric, TMs pearly, nares patent, no discharge or erythema, pharynx normal Oral cavity: MMM, no lesions Neck: supple, no lymphadenopathy, no thyromegaly, no masses Heart: RRR, normal S1, S2, no murmurs Lungs: CTA bilaterally, no wheezes, rhonchi, or rales Abdomen: +bs, soft, non tender, non distended, no masses, no hepatomegaly, no splenomegaly Musculoskeletal: nontender, no swelling, no obvious deformity Extremities: no edema, no cyanosis, no clubbing Pulses: 2+ symmetric, upper and lower extremities, normal cap refill Neurological: alert, oriented x 3, CN2-12 intact, strength normal upper extremities and lower extremities, sensation normal throughout, DTRs 2+ throughout, no cerebellar signs, gait normal Psychiatric: normal affect, behavior normal, pleasant    Izora Ribas, NP   03/26/2021

## 2021-03-27 ENCOUNTER — Other Ambulatory Visit: Payer: Self-pay | Admitting: Adult Health

## 2021-03-27 LAB — CBC WITH DIFFERENTIAL/PLATELET
Absolute Monocytes: 1010 cells/uL — ABNORMAL HIGH (ref 200–950)
Basophils Absolute: 56 cells/uL (ref 0–200)
Basophils Relative: 0.5 %
Eosinophils Absolute: 155 cells/uL (ref 15–500)
Eosinophils Relative: 1.4 %
HCT: 42.8 % (ref 35.0–45.0)
Hemoglobin: 14.2 g/dL (ref 11.7–15.5)
Lymphs Abs: 2287 cells/uL (ref 850–3900)
MCH: 30.5 pg (ref 27.0–33.0)
MCHC: 33.2 g/dL (ref 32.0–36.0)
MCV: 91.8 fL (ref 80.0–100.0)
MPV: 10.1 fL (ref 7.5–12.5)
Monocytes Relative: 9.1 %
Neutro Abs: 7592 cells/uL (ref 1500–7800)
Neutrophils Relative %: 68.4 %
Platelets: 262 10*3/uL (ref 140–400)
RBC: 4.66 10*6/uL (ref 3.80–5.10)
RDW: 12 % (ref 11.0–15.0)
Total Lymphocyte: 20.6 %
WBC: 11.1 10*3/uL — ABNORMAL HIGH (ref 3.8–10.8)

## 2021-03-27 LAB — COMPLETE METABOLIC PANEL WITH GFR
AG Ratio: 1.7 (calc) (ref 1.0–2.5)
ALT: 20 U/L (ref 6–29)
AST: 19 U/L (ref 10–35)
Albumin: 4.5 g/dL (ref 3.6–5.1)
Alkaline phosphatase (APISO): 83 U/L (ref 37–153)
BUN: 11 mg/dL (ref 7–25)
CO2: 31 mmol/L (ref 20–32)
Calcium: 9.1 mg/dL (ref 8.6–10.4)
Chloride: 103 mmol/L (ref 98–110)
Creat: 0.81 mg/dL (ref 0.50–1.05)
Globulin: 2.6 g/dL (calc) (ref 1.9–3.7)
Glucose, Bld: 92 mg/dL (ref 65–99)
Potassium: 4.4 mmol/L (ref 3.5–5.3)
Sodium: 140 mmol/L (ref 135–146)
Total Bilirubin: 0.5 mg/dL (ref 0.2–1.2)
Total Protein: 7.1 g/dL (ref 6.1–8.1)
eGFR: 79 mL/min/{1.73_m2} (ref >=60)

## 2021-03-27 LAB — HEMOGLOBIN A1C
Hgb A1c MFr Bld: 5.7 % of total Hgb — ABNORMAL HIGH (ref <5.7)
Mean Plasma Glucose: 117 mg/dL
eAG (mmol/L): 6.5 mmol/L

## 2021-03-27 LAB — VITAMIN D 25 HYDROXY (VIT D DEFICIENCY, FRACTURES): Vit D, 25-Hydroxy: 70 ng/mL (ref 30–100)

## 2021-03-27 LAB — TSH: TSH: 1.59 mIU/L (ref 0.40–4.50)

## 2021-03-27 MED ORDER — AZITHROMYCIN 250 MG PO TABS: ORAL_TABLET | ORAL | 1 refills | Status: AC

## 2021-03-27 MED ORDER — BENZONATATE 200 MG PO CAPS: ORAL_CAPSULE | ORAL | 1 refills | Status: DC

## 2021-04-01 DIAGNOSIS — F411 Generalized anxiety disorder: Secondary | ICD-10-CM | POA: Diagnosis not present

## 2021-04-23 DIAGNOSIS — F411 Generalized anxiety disorder: Secondary | ICD-10-CM | POA: Diagnosis not present

## 2021-05-07 DIAGNOSIS — F411 Generalized anxiety disorder: Secondary | ICD-10-CM | POA: Diagnosis not present

## 2021-05-15 DIAGNOSIS — F411 Generalized anxiety disorder: Secondary | ICD-10-CM | POA: Diagnosis not present

## 2021-06-01 ENCOUNTER — Other Ambulatory Visit: Payer: Self-pay | Admitting: Adult Health

## 2021-06-01 DIAGNOSIS — F331 Major depressive disorder, recurrent, moderate: Secondary | ICD-10-CM

## 2021-06-03 DIAGNOSIS — Z961 Presence of intraocular lens: Secondary | ICD-10-CM | POA: Diagnosis not present

## 2021-06-03 DIAGNOSIS — H35373 Puckering of macula, bilateral: Secondary | ICD-10-CM | POA: Diagnosis not present

## 2021-06-03 DIAGNOSIS — H43812 Vitreous degeneration, left eye: Secondary | ICD-10-CM | POA: Diagnosis not present

## 2021-06-03 DIAGNOSIS — H31091 Other chorioretinal scars, right eye: Secondary | ICD-10-CM | POA: Diagnosis not present

## 2021-06-04 DIAGNOSIS — L821 Other seborrheic keratosis: Secondary | ICD-10-CM | POA: Diagnosis not present

## 2021-06-04 DIAGNOSIS — L820 Inflamed seborrheic keratosis: Secondary | ICD-10-CM | POA: Diagnosis not present

## 2021-06-09 DIAGNOSIS — Z23 Encounter for immunization: Secondary | ICD-10-CM | POA: Diagnosis not present

## 2021-06-19 DIAGNOSIS — F411 Generalized anxiety disorder: Secondary | ICD-10-CM | POA: Diagnosis not present

## 2021-07-06 DIAGNOSIS — F411 Generalized anxiety disorder: Secondary | ICD-10-CM | POA: Diagnosis not present

## 2021-07-17 DIAGNOSIS — F411 Generalized anxiety disorder: Secondary | ICD-10-CM | POA: Diagnosis not present

## 2021-08-13 ENCOUNTER — Encounter: Payer: Medicare Other | Admitting: Adult Health

## 2021-08-18 DIAGNOSIS — F411 Generalized anxiety disorder: Secondary | ICD-10-CM | POA: Diagnosis not present

## 2021-08-20 DIAGNOSIS — F411 Generalized anxiety disorder: Secondary | ICD-10-CM | POA: Diagnosis not present

## 2021-09-03 ENCOUNTER — Ambulatory Visit (INDEPENDENT_AMBULATORY_CARE_PROVIDER_SITE_OTHER): Payer: Medicare Other | Admitting: Adult Health

## 2021-09-03 ENCOUNTER — Other Ambulatory Visit: Payer: Self-pay

## 2021-09-03 ENCOUNTER — Encounter: Payer: Self-pay | Admitting: Adult Health

## 2021-09-03 VITALS — BP 118/70 | HR 70 | Temp 97.5°F | Ht 59.0 in | Wt 132.8 lb

## 2021-09-03 DIAGNOSIS — E538 Deficiency of other specified B group vitamins: Secondary | ICD-10-CM

## 2021-09-03 DIAGNOSIS — F419 Anxiety disorder, unspecified: Secondary | ICD-10-CM

## 2021-09-03 DIAGNOSIS — F331 Major depressive disorder, recurrent, moderate: Secondary | ICD-10-CM | POA: Diagnosis not present

## 2021-09-03 DIAGNOSIS — E559 Vitamin D deficiency, unspecified: Secondary | ICD-10-CM

## 2021-09-03 DIAGNOSIS — Z1389 Encounter for screening for other disorder: Secondary | ICD-10-CM | POA: Diagnosis not present

## 2021-09-03 DIAGNOSIS — E038 Other specified hypothyroidism: Secondary | ICD-10-CM | POA: Diagnosis not present

## 2021-09-03 DIAGNOSIS — M810 Age-related osteoporosis without current pathological fracture: Secondary | ICD-10-CM

## 2021-09-03 DIAGNOSIS — N182 Chronic kidney disease, stage 2 (mild): Secondary | ICD-10-CM | POA: Diagnosis not present

## 2021-09-03 DIAGNOSIS — Z131 Encounter for screening for diabetes mellitus: Secondary | ICD-10-CM

## 2021-09-03 DIAGNOSIS — Z136 Encounter for screening for cardiovascular disorders: Secondary | ICD-10-CM | POA: Diagnosis not present

## 2021-09-03 DIAGNOSIS — E785 Hyperlipidemia, unspecified: Secondary | ICD-10-CM | POA: Diagnosis not present

## 2021-09-03 DIAGNOSIS — E063 Autoimmune thyroiditis: Secondary | ICD-10-CM | POA: Diagnosis not present

## 2021-09-03 DIAGNOSIS — Z0001 Encounter for general adult medical examination with abnormal findings: Secondary | ICD-10-CM

## 2021-09-03 DIAGNOSIS — I10 Essential (primary) hypertension: Secondary | ICD-10-CM

## 2021-09-03 DIAGNOSIS — Z6826 Body mass index (BMI) 26.0-26.9, adult: Secondary | ICD-10-CM | POA: Diagnosis not present

## 2021-09-03 DIAGNOSIS — R7309 Other abnormal glucose: Secondary | ICD-10-CM

## 2021-09-03 NOTE — Progress Notes (Signed)
CPE and 3 MONTH FOLLOW UP  Assessment:     Encounter for Annual Physical Exam with abnormal findings Due annually  Health Maintenance reviewed Healthy lifestyle reviewed and goals set Check with insurance about shingrix   Essential hypertension Well controlled DASH diet, exercise and start monitoring at home.  Call if greater than 130/80.  - CBC with Differential/Platelet - CMP/GFR  Other specified hypothyroidism Managed by [name], now on armour thyroid and much improved.  Continue medications the same, reminded to take on an empty stomach 30-38mins before food.  - recent TSH, defer   Hyperlipidemia -continue medications, check lipids, decrease fatty foods, increase activity.  - Lipid panelt  Depression, major, active ([place]) - with anxiety and grief reaction Lexapro liquid with slow taper, buspar Following with psych and counseling due to med intolerance Lifestyle discussed: diet/exerise, sleep hygiene, stress management, hydration  Other abnormal glucose Discussed general issues about diabetes pathophysiology and management., Educational material distributed., Suggested low cholesterol diet., Encouraged aerobic exercise., Discussed foot care., Reminded to get yearly retinal exam. Hemoglobin A1c q8m; monitor weight, serum glucose   Vitamin D deficiency At goal at recent check; continue to recommend supplementation for goal of 70-100 Check vitamin D   Osteoarthritis of both knees, unspecified osteoarthritis type Continue weight loss, better after surgery   Osteopenia Continue with newly prescribed fosamax Repeat DEXA 2 years - 08/2022 [name] following as well  continue Vit D and Ca, weight bearing exercises  Medication management CBC, CMP/GFR  BMI 26  Continue to recommend diet heavy in fruits and veggies and low in animal meats, cheeses, and dairy products, appropriate calorie intake Discuss exercise recommendations routinely Continue to monitor weight  at each visit   Orders Placed This Encounter  Procedures   CBC with Differential/Platelet   COMPLETE METABOLIC PANEL WITH GFR   Magnesium   Lipid panel   TSH   Hemoglobin A1c   VITAMIN D 25 Hydroxy (Vit-D Deficiency, Fractures)   Vitamin B12   Microalbumin / creatinine urine ratio   Urinalysis, Routine w reflex microscopic   EKG 12-Lead    Over 40 minutes of exam, counseling, chart review and critical decision making was performed Future Appointments  Date Time Provider [place]  10/30/2021  2:20 PM Philemon Kingdom, MD [facility] None  11/19/2021  2:30 PM Liane Comber, NP [facility] None  09/07/2022  2:00 PM Liane Comber, NP [facility] None     Plan:   During the course of the visit the patient was educated and counseled about appropriate screening and preventive services including:   Pneumococcal vaccine  Prevnar 13 Influenza vaccine Td vaccine Screening electrocardiogram Bone densitometry screening Colorectal cancer screening Diabetes screening Glaucoma screening Nutrition counseling  Advanced directives: requested   Subjective:  Michelle Miranda is a 71 y.o. female who presents for AWV. She has OA (osteoarthritis) of knee; Hyperlipidemia; Hypertension; Hypothyroidism due to Hashimoto's thyroiditis; Vitamin D deficiency; Other abnormal glucose; Osteoporosis; Moderate episode of recurrent major depressive disorder ([place]); Anxiety; and CKD (chronic kidney disease) stage 2, GFR 60-89 ml/min on their problem list.   She is widowed. First grandchild (girl) arrived in 2019, doing well, watches her during the day a few days a week. New grandson scheduled for C section tomorrow. She is retired Barista.  She has dx of recurrent depression/anxiety on cymbalta for many years; she abruptly lost husband to cancer in 2725 with complicated grief, severe panic attacks with excess xanax use. We attempted taper off of cymbalta to  try lexapro, but was  evaluated 05/21/2020 in ED for AMS, unremarkable workup with concern for SS, was advised to taper to stop. She saw Dr. Jannifer Franklin and had normal EEG 06/09/2020. She is now following with Debby Bud, NP, did genomic testing and apparently numerous intolerances. Also following with a counselor with perceived benefit. Per her strong request she is on lexapro liquid formulation and doing very slow taper and does feel doing well with this. Using buspar to bridge.   She has osteoporosis per DEXA 08/2020 with L hip T -2.6, follows with [name]. She declined fosamax at this time, working on lifestyle. Doing pilates twice weekly.  BMI is Body mass index is 26.82 kg/m., she has been working on diet and exercise, walking regularly with her granddaughter and going to 2 pilates classes.  Wt Readings from Last 3 Encounters:  09/03/21 132 lb 12.8 oz (60.2 kg)  03/26/21 128 lb 12.8 oz (58.4 kg)  11/19/20 128 lb (58.1 kg)   She has not been checking BP at home, today their BP is BP: 118/70 She does workout. She denies chest pain, shortness of breath, dizziness.   She is not on cholesterol medication and denies myalgias. Her cholesterol is at goal. The cholesterol last visit was:   Lab Results  Component Value Date   CHOL 174 11/19/2020   HDL 75 11/19/2020   LDLCALC 85 11/19/2020   TRIG 55 11/19/2020   CHOLHDL 2.3 11/19/2020    She has not been working on diet and exercise for glucose management, and denies increased appetite, nausea, paresthesia of the feet, polydipsia, polyuria and visual disturbances. Last A1C in the office was:  Lab Results  Component Value Date   HGBA1C 5.7 (H) 03/26/2021   She is on thyroid medication, now following with [name], was switched to armour thyroid and per patient feeling much improved. Her medication was not changed last visit.  Doing 45 mg armour thyroid daily.  Lab Results  Component Value Date   TSH 1.59 03/26/2021   Last GFR: Lab Results  Component Value  Date   GFRNONAA 91 11/19/2020   Patient is on Vitamin D supplement, taking 5000 IU with K2.   Lab Results  Component Value Date   VD25OH 55 03/26/2021     She is on B12 supplement; just in her mutivitamin, 37.5 mg daily  Lab Results  Component Value Date   VITAMINB12 1,322 (H) 08/13/2020      Medication Review: Current Outpatient Medications on File Prior to Visit  Medication Sig Dispense Refill   ARMOUR THYROID 90 MG tablet TAKE 1/2 TABLET BY MOUTH DAILY 45 tablet 3   ascorbic acid (VITAMIN C) 500 MG tablet Take 500 mg by mouth daily.     busPIRone (BUSPAR) 7.5 MG tablet Take 7.5 mg by mouth 2 (two) times daily.     Cyanocobalamin (B12 LIQUID HEALTH BOOSTER PO) Take by mouth daily.     escitalopram (LEXAPRO) 5 MG/5ML solution TAKE 1-10ML BY MOUTH DAILY INSTEAD OF TABLETS AS PER SLOW TAPER INSTRUCTIONS. REDUCE DOSE BY 1ML EVERY 2-4 WEEKS 240 mL 3   fluticasone (FLONASE) 50 MCG/ACT nasal spray Place 1-2 sprays into both nostrils daily as needed for allergies or rhinitis. 16 g 2   MAGNESIUM GLUCONATE PO Take 400 mg by mouth.     Multiple Vitamin (MULTIVITAMIN ADULT PO) Take by mouth.     Omega-3 Fatty Acids (FISH OIL OMEGA-3 PO) Take 1,200 mg by mouth.     VITAMIN D-VITAMIN  K PO Take 1 tablet by mouth daily. Vit D 5000 Iu +K2 100 mcg      Cholecalciferol (VITAMIN D3) 5000 units CAPS Take by mouth.     No current facility-administered medications on file prior to visit.    Allergies  Allergen Reactions   Oxycodone     "Found Oxycodone not effective for pain control" with last RTKA   Other     Opiods,causes severe anxiety    Current Problems (verified) Patient Active Problem List   Diagnosis Date Noted   CKD (chronic kidney disease) stage 2, GFR 60-89 ml/min 08/14/2020   Anxiety    Moderate episode of recurrent major depressive disorder ([place]) 03/27/2020   Other abnormal glucose 05/19/2015   Osteoporosis 05/19/2015   Hyperlipidemia    Hypertension    Hypothyroidism due  to Hashimoto's thyroiditis    Vitamin D deficiency    OA (osteoarthritis) of knee 01/03/2012    Screening Tests Immunization History  Administered Date(s) Administered   Fluad Quad(high Dose 65+) 04/12/2019   Influenza Inj Mdck Quad Pf 05/23/2017   Influenza Split 05/10/2014, 05/19/2015   Influenza, High Dose Seasonal PF 07/02/2018, 08/14/2018, 06/09/2021   Influenza,inj,Quad PF,6+ Mos 03/23/2016   Influenza-Unspecified 04/12/2019, 07/07/2020   PFIZER(Purple Top)SARS-COV-2 Vaccination 09/13/2019, 10/08/2019, 07/07/2020, 02/25/2021   Pneumococcal Conjugate-13 07/09/2019   Pneumococcal Polysaccharide-23 03/14/2018   Td 06/02/2006   Tdap 03/14/2018   Health Maintenance  Topic Date Due   Zoster Vaccines- Shingrix (1 of 2) Never done   COVID-19 Vaccine (5 - Booster for Pfizer series) 04/22/2021   MAMMOGRAM  08/22/2021   DEXA SCAN  08/22/2022   TETANUS/TDAP  03/14/2028   COLONOSCOPY (Pts 45-54yrs Insurance coverage will need to be confirmed)  01/02/2029   Pneumonia Vaccine 85+ Years old  Completed   INFLUENZA VACCINE  Completed   Hepatitis C Screening  Completed   HPV VACCINES  Aged Out   Pap: 03/2018, never abnormal, HPV neg, DONE MGM: at breast center, will schedule  DEXA: 08/2020, T-2.6 L fem, [name] is following, did recommend the fosamax that we prescribed for 2 years but patient declined  Colonoscopy: 01/2019 Dr. Henrene Pastor, 10 year follow up  Names of Other Physician/Practitioners you currently use: 1. [facility] here for primary care 2. Vision: Dr. Bing Plume, last visit 2022, 3. Dental: Dr. Marquette Saa, last visit 2022 q22m, looking for a new dentist  4. Derm: Dr. Adah Salvage office, new provider, last 2022 total body check   [name], [name] [name], MD as Consulting Physician (Orthopedic Surgery) [name], MD as Consulting Physician (Ophthalmology) Irene Shipper, MD  as Consulting Physician (Gastroenterology) Philemon Kingdom, MD as Consulting Physician (Internal Medicine) Garnet Sierras, NP as Nurse Practitioner (Adult Health Nurse Practitioner)  SURGICAL HISTORY She  has a past surgical history that includes c sections (jan 1980 and march 1984); ovarian cyst with lararoscopy march 1998 (1998); Total knee arthroplasty (01/03/2012); Wisdom tooth extraction (09-05-12); Total knee arthroplasty (Left, 09/11/2012); Cataract extraction, bilateral (Bilateral, 2016); and Colonoscopy. FAMILY HISTORY Her family history includes Arthritis in her mother; Breast cancer in her maternal grandmother and paternal grandmother; COPD in her father; Cancer in her mother; Hypertension in her mother; Stroke (age of onset: 63) in her maternal grandmother; Thyroid disease in her mother. SOCIAL HISTORY She  reports that she has never smoked. She has never used smokeless tobacco. She reports that she does not currently use alcohol. She reports  that she does not use drugs.   Review of Systems  Constitutional:  Negative for malaise/fatigue and weight loss.  HENT:  Negative for hearing loss and tinnitus.   Eyes:  Negative for blurred vision and double vision.  Respiratory:  Negative for cough, sputum production, shortness of breath and wheezing.   Cardiovascular:  Negative for chest pain, palpitations, orthopnea, claudication, leg swelling and PND.  Gastrointestinal:  Negative for abdominal pain, blood in stool, constipation, diarrhea, heartburn, melena, nausea and vomiting.  Genitourinary: Negative.   Musculoskeletal:  Negative for falls, joint pain and myalgias.  Skin:  Negative for rash.  Neurological:  Negative for dizziness, tingling, sensory change, weakness and headaches.  Endo/Heme/Allergies:  Negative for polydipsia.  Psychiatric/Behavioral:  Positive for depression. Negative for memory loss, substance abuse and suicidal ideas. The patient is not nervous/anxious and does not  have insomnia.   All other systems reviewed and are negative.   Objective:     Today's Vitals   09/03/21 1401  BP: 118/70  Pulse: 70  Temp: (!) 97.5 F (36.4 C)  SpO2: 99%  Weight: 132 lb 12.8 oz (60.2 kg)  Height: 4\' 11"  (1.499 m)   Body mass index is 26.82 kg/m.  General appearance: alert, no distress, WD/WN, female HEENT: normocephalic, sclerae anicteric, TMs pearly, nares patent, no discharge or erythema, pharynx normal Oral cavity: MMM, no lesions Neck: supple, no lymphadenopathy, no thyromegaly, no masses Heart: RRR, normal S1, S2, no murmurs Lungs: CTA bilaterally, no wheezes, rhonchi, or rales Abdomen: +bs, soft, non tender, non distended, no masses, no hepatomegaly, no splenomegaly Musculoskeletal: nontender, no swelling, no obvious deformity Extremities: no edema, no cyanosis, no clubbing Pulses: 2+ symmetric, upper and lower extremities, normal cap refill Neurological: alert, oriented x 3, CN2-12 intact, strength normal upper extremities and lower extremities, sensation normal throughout, DTRs 2+ throughout, no cerebellar signs, gait normal Psychiatric: normal affect, behavior normal, pleasant  Breasts: declines, getting annual mammogram  GU: defer, no concerns  EKG: NSR   Izora Ribas, NP   09/03/2021

## 2021-09-03 NOTE — Patient Instructions (Signed)
°  Michelle Miranda , Thank you for taking time to come for your Annual Wellness Visit. I appreciate your ongoing commitment to your health goals. Please review the following plan we discussed and let me know if I can assist you in the future.   These are the goals we discussed:  Goals      Blood Pressure < 130/80     Exercise 150 min/wk Moderate Activity     Weight (lb) < 150 lb (68 kg)        This is a list of the screening recommended for you and due dates:  Health Maintenance  Topic Date Due   Zoster (Shingles) Vaccine (1 of 2) Never done   COVID-19 Vaccine (5 - Booster for Pfizer series) 04/22/2021   Mammogram  08/22/2021   DEXA scan (bone density measurement)  08/22/2022   Tetanus Vaccine  03/14/2028   Colon Cancer Screening  01/02/2029   Pneumonia Vaccine  Completed   Flu Shot  Completed   Hepatitis C Screening: USPSTF Recommendation to screen - Ages 18-79 yo.  Completed   HPV Vaccine  Aged Out    HOW TO SCHEDULE A MAMMOGRAM  [facility]  7 a.m.-6:30 p.m., Monday 7 a.m.-5 p.m., Tuesday-Friday Schedule an appointment by calling [phone].     Know what a healthy weight is for you (roughly BMI <25) and aim to maintain this  Aim for 7+ servings of fruits and vegetables daily  65-80+ fluid ounces of water or unsweet tea for healthy kidneys  Limit to max 1 drink of alcohol per day; avoid smoking/tobacco  Limit animal fats in diet for cholesterol and heart health - choose grass fed whenever available  Avoid highly processed foods, and foods high in saturated/trans fats  Aim for low stress - take time to unwind and care for your mental health  Aim for 150 min of moderate intensity exercise weekly for heart health, and weights twice weekly for bone health  Aim for 7-9 hours of sleep daily     A great goal to work towards is aiming to get in a serving daily of some of the most nutritionally dense foods - G- BOMBS daily

## 2021-09-04 LAB — LIPID PANEL
Cholesterol: 192 mg/dL (ref <200)
HDL: 100 mg/dL (ref >=50)
LDL Cholesterol (Calc): 82 mg/dL (calc)
Non-HDL Cholesterol (Calc): 92 mg/dL (calc) (ref <130)
Total CHOL/HDL Ratio: 1.9 (calc) (ref <5.0)
Triglycerides: 36 mg/dL (ref <150)

## 2021-09-04 LAB — MICROALBUMIN / CREATININE URINE RATIO
Creatinine, Urine: 45 mg/dL (ref 20–275)
Microalb Creat Ratio: 4 mcg/mg creat (ref <30)
Microalb, Ur: 0.2 mg/dL

## 2021-09-04 LAB — CBC WITH DIFFERENTIAL/PLATELET
Absolute Monocytes: 788 cells/uL (ref 200–950)
Basophils Absolute: 62 cells/uL (ref 0–200)
Basophils Relative: 0.8 %
Eosinophils Absolute: 109 cells/uL (ref 15–500)
Eosinophils Relative: 1.4 %
HCT: 42.8 % (ref 35.0–45.0)
Hemoglobin: 14.2 g/dL (ref 11.7–15.5)
Lymphs Abs: 2239 cells/uL (ref 850–3900)
MCH: 30.6 pg (ref 27.0–33.0)
MCHC: 33.2 g/dL (ref 32.0–36.0)
MCV: 92.2 fL (ref 80.0–100.0)
MPV: 9.8 fL (ref 7.5–12.5)
Monocytes Relative: 10.1 %
Neutro Abs: 4602 cells/uL (ref 1500–7800)
Neutrophils Relative %: 59 %
Platelets: 281 10*3/uL (ref 140–400)
RBC: 4.64 10*6/uL (ref 3.80–5.10)
RDW: 12.1 % (ref 11.0–15.0)
Total Lymphocyte: 28.7 %
WBC: 7.8 10*3/uL (ref 3.8–10.8)

## 2021-09-04 LAB — COMPLETE METABOLIC PANEL WITH GFR
AG Ratio: 1.5 (calc) (ref 1.0–2.5)
ALT: 22 U/L (ref 6–29)
AST: 23 U/L (ref 10–35)
Albumin: 4.3 g/dL (ref 3.6–5.1)
Alkaline phosphatase (APISO): 80 U/L (ref 37–153)
BUN: 12 mg/dL (ref 7–25)
CO2: 31 mmol/L (ref 20–32)
Calcium: 9.2 mg/dL (ref 8.6–10.4)
Chloride: 104 mmol/L (ref 98–110)
Creat: 0.76 mg/dL (ref 0.50–1.05)
Globulin: 2.9 g/dL (calc) (ref 1.9–3.7)
Glucose, Bld: 85 mg/dL (ref 65–99)
Potassium: 4.4 mmol/L (ref 3.5–5.3)
Sodium: 141 mmol/L (ref 135–146)
Total Bilirubin: 0.5 mg/dL (ref 0.2–1.2)
Total Protein: 7.2 g/dL (ref 6.1–8.1)
eGFR: 85 mL/min/{1.73_m2} (ref >=60)

## 2021-09-04 LAB — URINALYSIS, ROUTINE W REFLEX MICROSCOPIC
Bilirubin Urine: NEGATIVE
Glucose, UA: NEGATIVE
Hgb urine dipstick: NEGATIVE
Ketones, ur: NEGATIVE
Leukocytes,Ua: NEGATIVE
Nitrite: NEGATIVE
Protein, ur: NEGATIVE
Specific Gravity, Urine: 1.008 (ref 1.001–1.035)
pH: 6 (ref 5.0–8.0)

## 2021-09-04 LAB — MAGNESIUM: Magnesium: 2.4 mg/dL (ref 1.5–2.5)

## 2021-09-04 LAB — HEMOGLOBIN A1C
Hgb A1c MFr Bld: 5.6 % of total Hgb (ref <5.7)
Mean Plasma Glucose: 114 mg/dL
eAG (mmol/L): 6.3 mmol/L

## 2021-09-04 LAB — TSH: TSH: 1.11 mIU/L (ref 0.40–4.50)

## 2021-09-04 LAB — VITAMIN B12: Vitamin B-12: 765 pg/mL (ref 200–1100)

## 2021-09-04 LAB — VITAMIN D 25 HYDROXY (VIT D DEFICIENCY, FRACTURES): Vit D, 25-Hydroxy: 73 ng/mL (ref 30–100)

## 2021-09-09 ENCOUNTER — Other Ambulatory Visit: Payer: Self-pay | Admitting: Internal Medicine

## 2021-09-09 DIAGNOSIS — Z1231 Encounter for screening mammogram for malignant neoplasm of breast: Secondary | ICD-10-CM

## 2021-09-15 ENCOUNTER — Ambulatory Visit
Admission: RE | Admit: 2021-09-15 | Discharge: 2021-09-15 | Disposition: A | Discharge status: Home / Self Care | Payer: Medicare Other | Source: Ambulatory Visit | Attending: Internal Medicine | Admitting: Internal Medicine

## 2021-09-15 ENCOUNTER — Other Ambulatory Visit: Payer: Self-pay

## 2021-09-15 DIAGNOSIS — Z1231 Encounter for screening mammogram for malignant neoplasm of breast: Secondary | ICD-10-CM | POA: Diagnosis not present

## 2021-09-17 DIAGNOSIS — D1801 Hemangioma of skin and subcutaneous tissue: Secondary | ICD-10-CM | POA: Diagnosis not present

## 2021-09-17 DIAGNOSIS — D225 Melanocytic nevi of trunk: Secondary | ICD-10-CM | POA: Diagnosis not present

## 2021-09-17 DIAGNOSIS — L814 Other melanin hyperpigmentation: Secondary | ICD-10-CM | POA: Diagnosis not present

## 2021-09-17 DIAGNOSIS — L821 Other seborrheic keratosis: Secondary | ICD-10-CM | POA: Diagnosis not present

## 2021-09-17 DIAGNOSIS — L304 Erythema intertrigo: Secondary | ICD-10-CM | POA: Diagnosis not present

## 2021-09-29 DIAGNOSIS — F411 Generalized anxiety disorder: Secondary | ICD-10-CM | POA: Diagnosis not present

## 2021-10-30 ENCOUNTER — Ambulatory Visit: Payer: Medicare Other | Admitting: Internal Medicine

## 2021-11-03 ENCOUNTER — Encounter: Payer: Self-pay | Admitting: Internal Medicine

## 2021-11-03 ENCOUNTER — Ambulatory Visit (INDEPENDENT_AMBULATORY_CARE_PROVIDER_SITE_OTHER): Payer: Medicare Other | Admitting: Internal Medicine

## 2021-11-03 VITALS — BP 138/68 | HR 73 | Ht 59.0 in | Wt 133.2 lb

## 2021-11-03 DIAGNOSIS — E063 Autoimmune thyroiditis: Secondary | ICD-10-CM | POA: Diagnosis not present

## 2021-11-03 DIAGNOSIS — E038 Other specified hypothyroidism: Secondary | ICD-10-CM | POA: Diagnosis not present

## 2021-11-03 DIAGNOSIS — M810 Age-related osteoporosis without current pathological fracture: Secondary | ICD-10-CM | POA: Diagnosis not present

## 2021-11-03 NOTE — Progress Notes (Signed)
Patient ID: Michelle Miranda, female   DOB: 1951/02/07, 71 y.o.   MRN: [id number]  ? ?This visit occurred during the SARS-CoV-2 public health emergency.  Safety protocols were in place, including screening questions prior to the visit, additional usage of staff PPE, and extensive cleaning of exam room while observing appropriate contact time as indicated for disinfecting solutions.  ? ?HPI  ?Michelle Miranda is a 71 y.o.-year-old female, initially referred by her PCP, Vicie Mutters, NP (Dr. Melford Aase), returning for follow-up for Hashimoto's hypothyroidism and she was also wanting to address her osteoporosis. Last visit 7 mo ago. ? ?Interim history: ?No falls or fractures since last visit. ?No dizziness/vertigo/orthostasis. 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 ? ?Hashimoto's hypothyroidism: ?Reviewed history: ?Pt. has been dx with hypothyroidism in 2012 and Hashimoto's thyroiditis in 04/2016 >> prev. On Levothyroxine, then on Synthroid d.a.w. 37.5 mcg 6/7 and 75 mcg 1/7 (changed in 01/2016). ? ?Latest thyroid dose change was to 50 mcg daily and she has maintained normal TFTs afterwards.   ? ?We changed from levothyroxine to Armour 01/2019.  She feels much better on Armour, with less fatigue. ? ?She takes Armour 45 mg daily: ?- in am (around 3 AM, then goes back to bed) ?- fasting ?- at least 30 min from b'fast ?- no Ca, Fe,  PPIs, + MVI more than 4 hours later ? ?She was previously on selenium 200 mcg daily, but now off. ? ?Reviewed her TFTs: ?Lab Results  ?Component Value Date  ? TSH 1.11 09/03/2021  ? TSH 1.59 03/26/2021  ? TSH 1.86 10/27/2020  ? TSH 1.59 08/13/2020  ? TSH 1.40 05/07/2020  ? TSH 2.06 02/11/2020  ? TSH 1.71 10/17/2019  ? TSH 3.29 08/23/2019  ? TSH 2.50 06/13/2019  ? TSH 2.77 02/26/2019  ? FREET4 0.82 10/27/2020  ? FREET4 1.1 08/13/2020  ? FREET4 0.77 05/07/2020  ? FREET4 0.70  08/23/2019  ? FREET4 0.61 06/13/2019  ? FREET4 0.76 02/26/2019  ? FREET4 0.90 02/10/2018  ? FREET4 0.74 02/10/2017  ? FREET4 0.70 05/18/2016  ? FREET4 0.79 04/09/2016  ?  ?She has a history of Hashimoto thyroiditis based on elevated TPO antibodies: ?Component ?    Latest Ref Rng & Units 04/09/2016  ?Thyroperoxidase Ab SerPl-aCnc ?    <9 IU/mL 9 (H)  ?Thyroglobulin Ab ?    <2 IU/mL <1  ? ?Pt denies: ?- feeling nodules in neck ?- hoarseness ?- dysphagia ?- SOB with lying down ?But she has occasional choking, which is chronic. ? ?She has + FH of thyroid disorders in: mother - hypothyroid. No FH of thyroid cancer. No h/o radiation tx to head or neck. ? ?+ herbal supplements (L theanine). On Magnesium. No recent steroids use.  ? ?Osteoporosis: ? ?Pt was dx with OP in 08/2020. ? ?I reviewed pt's DXA scans: ?Date Spine FN T score 33% distal Radius  ?[facility] (L1, L2) L3-L4: -1.3 (-7.1%*) RFN: -2.2 ?LFN: -2.6 n/a  ?07/21/2018 [facility]) n/a RFN: -2.0 ?LFN: -2.4 -1.4  ?07/18/2015 [facility]) L1-L4 (L3): -1.6 RFN: -1.8 ?LFN: -2.2 n/a  ? ?She denies fractures.  No falls.  She does have height loss. ? ?No dizziness/vertigo/orthostasis/poor vision.  ? ?Previous OP treatments:  ?- Evista  ~2005 >>  08/2020 ?- Fosamax - started 09/2020 ? ?She does not have dental work coming up. ? ?No h/o vitamin D deficiency. Reviewed  available vit D levels: ?Lab Results  ?Component Value Date  ? VD25OH 73 09/03/2021  ? VD25OH 70 03/26/2021  ? VD25OH 79 08/13/2020  ? VD25OH 68 02/11/2020  ? VD25OH 48 07/09/2019  ? VD25OH 71 03/14/2018  ? VD25OH 57 07/06/2016  ? VD25OH 60 02/23/2016  ? VD25OH 39 05/19/2015  ? VD25OH 30 01/01/2015  ? ?Pt is on: ?- vitamin D 5000 units + vitamin K  daily (added vitamin K since last visit) ? ?She takes 1500 mg calcium from food.  She drinks Oatmilk. ? ?No weight bearing exercises. Used to do Pilates >> stopped >> now once a week. Now a new grandson - lifting him. Also has a 69 y/o  granddaughter. ? ?She does not take high vitamin A doses. ? ?Menopause was at 71 y/o.  ? ?+ FH of osteoporosis: In mother. ? ?No h/o persistent hyper/hypocalcemia or hyperparathyroidism. No h/o kidney stones. ?Lab Results  ?Component Value Date  ? CALCIUM 9.2 09/03/2021  ? CALCIUM 9.1 03/26/2021  ? CALCIUM 9.3 11/19/2020  ? CALCIUM 9.2 08/13/2020  ? CALCIUM 8.6 (L) 05/21/2020  ? CALCIUM 9.5 03/06/2020  ? CALCIUM 9.3 02/11/2020  ? CALCIUM 9.2 10/17/2019  ? CALCIUM 9.0 07/09/2019  ? CALCIUM 9.5 10/04/2018  ? ?No h/o CKD. Last BUN/Cr: ?Lab Results  ?Component Value Date  ? BUN 12 09/03/2021  ? CREATININE 0.76 09/03/2021  ? ?She has a h/o serotonin sd. From Cymbalta - 05/2020. ? ?ROS: ?+ see HPI ?Neurological: + tremors/no numbness/no tingling/no dizziness ? ?I reviewed pt's medications, allergies, PMH, social hx, family hx, and changes were documented in the history of present illness. Otherwise, unchanged from my initial visit note. ? ?Past Medical History:  ?Diagnosis Date  ? Altered mental status 05/28/2020  ? Anxiety   ? occasional  ? Arthritis   ? Depression   ? Hyperlipidemia   ? Hypertension   ? Hypothyroid   ? hypo thyroid  ? Osteopenia   ? Urinary tract infection 12/15/2011  ? once  ? Vitamin D deficiency   ? ?Past Surgical History:  ?Procedure Laterality Date  ? c sections  jan 1980 and march 1984  ? CATARACT EXTRACTION, BILATERAL Bilateral 2016  ? Dr. Bing Plume  ? COLONOSCOPY    ? ovarian cyst with lararoscopy march 1998  1998  ? TOTAL KNEE ARTHROPLASTY  01/03/2012  ? Procedure: TOTAL KNEE ARTHROPLASTY;  Surgeon: Gearlean Alf, MD;  Location: [facility];  Service: Orthopedics;  Laterality: Right;  ? TOTAL KNEE ARTHROPLASTY Left 09/11/2012  ? Procedure: Left Total Knee Arthroplasty;  Surgeon: Gearlean Alf, MD;  Location: [facility];  Service: Orthopedics;  Laterality: Left;  ? WISDOM TOOTH EXTRACTION  09-05-12  ? extractions  ? ?Social History  ? ?Social History  ? Marital status: Married  ?  Spouse name: N/A  ? Number  of children: 2  ? 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 ?Social History Main Topics  ? Smoking status: Never Smoker  ? Smokeless tobacco: Never Used  ? Alcohol use Yes  ?   Comment: 1-2 glass wine night  ? Drug use: No  ? ?Current Outpatient Medications on File Prior to Visit  ?Medication Sig Dispense Refill  ? ARMOUR THYROID 90 MG tablet TAKE 1/2 TABLET BY MOUTH DAILY 45 tablet 3  ? ascorbic acid (VITAMIN C) 500 MG tablet Take 500 mg by mouth daily.    ? busPIRone (BUSPAR) 7.5 MG tablet Take 7.5 mg by mouth 2 (two) times  daily.    ? Cholecalciferol (VITAMIN D3) 5000 units CAPS Take by mouth.    ? Cyanocobalamin (B12 LIQUID HEALTH BOOSTER PO) Take by mouth daily.    ? escitalopram (LEXAPRO) 5 MG/5ML solution TAKE 1-10ML BY MOUTH DAILY INSTEAD OF TABLETS AS PER SLOW TAPER INSTRUCTIONS. REDUCE DOSE BY 1ML EVERY 2-4 WEEKS 240 mL 3  ? fluticasone (FLONASE) 50 MCG/ACT nasal spray Place 1-2 sprays into both nostrils daily as needed for allergies or rhinitis. 16 g 2  ? MAGNESIUM GLUCONATE PO Take 400 mg by mouth.    ? Multiple Vitamin (MULTIVITAMIN ADULT PO) Take by mouth.    ? Omega-3 Fatty Acids (FISH OIL OMEGA-3 PO) Take 1,200 mg by mouth.    ? VITAMIN D-VITAMIN K PO Take 1 tablet by mouth daily. Vit D 5000 Iu +K2 100 mcg     ? ?No current facility-administered medications on file prior to visit.  ? ?Allergies  ?Allergen Reactions  ? Oxycodone   ?  "Found Oxycodone not effective for pain control" with last RTKA  ? Other   ?  Opiods,causes severe anxiety  ? ?Family History  ?Problem Relation Age of Onset  ? Hypertension Mother   ? Thyroid disease Mother   ? Arthritis Mother   ? Cancer Mother   ?     gallbladder cancer  ? COPD Father   ? Breast cancer Maternal Grandmother   ?     86s  ? Stroke Maternal Grandmother 52  ? Breast cancer Paternal Grandmother   ? Colon cancer Neg Hx   ? Esophageal cancer Neg Hx   ? Stomach cancer Neg Hx   ? Rectal cancer Neg Hx   ? ?PE: ?There were no vitals taken for this visit. ?Wt  Readings from Last 3 Encounters:  ?09/03/21 132 lb 12.8 oz (60.2 kg)  ?03/26/21 128 lb 12.8 oz (58.4 kg)  ?11/19/20 128 lb (58.1 kg)  ? ?Constitutional: normal weight, in NAD ?Eyes: PERRLA, EOMI, no exophthalmos ?ENT

## 2021-11-03 NOTE — Patient Instructions (Signed)
Please continue Armour 45 mg daily. ? ?Take the thyroid hormone every day, with water, at least 30 minutes before breakfast, separated by at least 4 hours from: ?- acid reflux medications ?- calcium ?- iron ?- multivitamins ? ?Try to look into OsteoStrong. ? ?Please return in 1 year. ?

## 2021-11-12 DIAGNOSIS — F411 Generalized anxiety disorder: Secondary | ICD-10-CM | POA: Diagnosis not present

## 2021-11-12 DIAGNOSIS — F330 Major depressive disorder, recurrent, mild: Secondary | ICD-10-CM | POA: Diagnosis not present

## 2021-11-19 ENCOUNTER — Ambulatory Visit: Payer: Medicare Other | Admitting: Adult Health

## 2021-11-19 ENCOUNTER — Other Ambulatory Visit: Payer: Self-pay | Admitting: Internal Medicine

## 2021-11-19 DIAGNOSIS — E038 Other specified hypothyroidism: Secondary | ICD-10-CM

## 2021-11-20 ENCOUNTER — Other Ambulatory Visit: Payer: Self-pay | Admitting: Adult Health

## 2021-11-20 DIAGNOSIS — F331 Major depressive disorder, recurrent, moderate: Secondary | ICD-10-CM

## 2021-12-24 DIAGNOSIS — F411 Generalized anxiety disorder: Secondary | ICD-10-CM | POA: Diagnosis not present

## 2021-12-24 DIAGNOSIS — F330 Major depressive disorder, recurrent, mild: Secondary | ICD-10-CM | POA: Diagnosis not present

## 2022-02-11 DIAGNOSIS — F411 Generalized anxiety disorder: Secondary | ICD-10-CM | POA: Diagnosis not present

## 2022-02-11 DIAGNOSIS — F330 Major depressive disorder, recurrent, mild: Secondary | ICD-10-CM | POA: Diagnosis not present

## 2022-03-11 ENCOUNTER — Ambulatory Visit (INDEPENDENT_AMBULATORY_CARE_PROVIDER_SITE_OTHER): Payer: Medicare Other | Admitting: Nurse Practitioner

## 2022-03-11 ENCOUNTER — Encounter: Payer: Self-pay | Admitting: Nurse Practitioner

## 2022-03-11 VITALS — BP 112/70 | HR 64 | Temp 97.9°F | Ht 59.0 in | Wt 132.0 lb

## 2022-03-11 DIAGNOSIS — R040 Epistaxis: Secondary | ICD-10-CM | POA: Diagnosis not present

## 2022-03-11 DIAGNOSIS — E559 Vitamin D deficiency, unspecified: Secondary | ICD-10-CM | POA: Diagnosis not present

## 2022-03-11 DIAGNOSIS — M810 Age-related osteoporosis without current pathological fracture: Secondary | ICD-10-CM | POA: Diagnosis not present

## 2022-03-11 DIAGNOSIS — E038 Other specified hypothyroidism: Secondary | ICD-10-CM | POA: Diagnosis not present

## 2022-03-11 DIAGNOSIS — E063 Autoimmune thyroiditis: Secondary | ICD-10-CM | POA: Diagnosis not present

## 2022-03-11 DIAGNOSIS — M170 Bilateral primary osteoarthritis of knee: Secondary | ICD-10-CM

## 2022-03-11 DIAGNOSIS — Z0001 Encounter for general adult medical examination with abnormal findings: Secondary | ICD-10-CM

## 2022-03-11 DIAGNOSIS — Z79899 Other long term (current) drug therapy: Secondary | ICD-10-CM | POA: Diagnosis not present

## 2022-03-11 DIAGNOSIS — R7309 Other abnormal glucose: Secondary | ICD-10-CM

## 2022-03-11 DIAGNOSIS — I10 Essential (primary) hypertension: Secondary | ICD-10-CM | POA: Diagnosis not present

## 2022-03-11 DIAGNOSIS — R6889 Other general symptoms and signs: Secondary | ICD-10-CM | POA: Diagnosis not present

## 2022-03-11 DIAGNOSIS — E785 Hyperlipidemia, unspecified: Secondary | ICD-10-CM

## 2022-03-11 DIAGNOSIS — F331 Major depressive disorder, recurrent, moderate: Secondary | ICD-10-CM | POA: Diagnosis not present

## 2022-03-11 DIAGNOSIS — Z0000 Encounter for general adult medical examination without abnormal findings: Secondary | ICD-10-CM

## 2022-03-11 DIAGNOSIS — Z6826 Body mass index (BMI) 26.0-26.9, adult: Secondary | ICD-10-CM | POA: Diagnosis not present

## 2022-03-11 NOTE — Progress Notes (Signed)
AWV and 3 MONTH FOLLOW UP  Assessment:     Encounter for annual Medicare exam Due annually   Essential hypertension Borderline above goal; discussed initiating medication today She declines at this time, plans to implement some lifestyle changes DASH diet, exercise and start monitoring at home.  Call if greater than 130/80.  - CBC with Differential/Platelet - CMP/GFR  Other specified hypothyroidism Managed by [name], now on armour thyroid and much improved.  Continue medications the same, reminded to take on an empty stomach 30-74mns before food.  - recent TSH, defer   Hyperlipidemia -continue medications, check lipids, decrease fatty foods, increase activity.  - Lipid panelt  Depression, major, active ([place] - with anxiety and grief reaction Lexapro liquid with slow taper Following with psych and counseling due to med intolerance Lifestyle discussed: diet/exerise, sleep hygiene, stress management, hydration  Other abnormal glucose Discussed general issues about diabetes pathophysiology and management., Educational material distributed., Suggested low cholesterol diet., Encouraged aerobic exercise., Discussed foot care., Reminded to get yearly retinal exam. Hemoglobin A1c q613mmonitor weight, serum glucose   Vitamin D deficiency At goal at recent check; continue to recommend supplementation for goal of 70-100 Check vitamin D   Osteoarthritis of both knees, unspecified osteoarthritis type Continue weight loss, better after surgery   Osteopenia Continue with newly prescribed fosamax Repeat DEXA 2 years - 08/2022 Dr. GhCruzita Ledererollowing as well  continue Vit D and Ca, weight bearing exercises  Medication management CBC, CMP/GFR  BMI 25 Continue to recommend diet heavy in fruits and veggies and low in animal meats, cheeses, and dairy products, appropriate calorie intake Discuss exercise recommendations routinely Continue to monitor weight at each  visit  Epistaxis Place vasoline inside left nostril BID. Use Flonase PRN. Continue to monitor  Orders Placed This Encounter  Procedures   CBC with Differential/Platelet   COMPLETE METABOLIC PANEL WITH GFR   Lipid panel   TSH   VITAMIN D 25 Hydroxy (Vit-D Deficiency, Fractures)   Hemoglobin A1c   Over 40 minutes of exam, counseling, chart review and critical decision making was performed Future Appointments  Date Time Provider [place]/08[place]20[place]  2:20 PM GhPhilemon KingdomMD [facility] None  09/07/2022  2:00 PM CrDarrol JumpNP [facility] None  12/23/2022  4:00 PM CrDarrol JumpNP [facility] None     Plan:   During the course of the visit the patient was educated and counseled about appropriate screening and preventive services including:   Pneumococcal vaccine  Prevnar 13 Influenza vaccine Td vaccine Screening electrocardiogram Bone densitometry screening Colorectal cancer screening Diabetes screening Glaucoma screening Nutrition counseling  Advanced directives: requested   Subjective:  Michelle BOWENSs a [age].o. female who presents for AWV. She has OA (osteoarthritis) of knee; Hyperlipidemia; Hypertension; Hypothyroidism due to Hashimoto's thyroiditis; Vitamin D deficiency; Other abnormal glucose; Osteoporosis; Moderate episode of recurrent major depressive disorder ([place] Anxiety; and CKD (chronic kidney disease) stage 2, GFR 60-89 ml/min on their problem list.   First grandchild (girl) arrived in 2019, doing well, watches her during the day a few days a week. She is retired laBarista Complaints of recurrent nose bleeds.  Has logged x9 episodes since 07/2022, always in the left nostril, not painful, not preceeded by HA. Two of them very sporadic, no truama, no allergies.  She has not seen ENT.  Last 10-15 min.  Able to be controlled. Uses flonase PRN  The patient notably lost her husband to cancer abruptly early 2021, has  had recurrent  depression/anxiety, grief reaction, severe anxiety and panic attacks on cymbalta 60 mg which she had been on for many years for depression. Transitioned to lexapro but was evaluated 05/21/2020 in ED for AMS, transient confusion/disorientation, unremarkable lab/imaging. There was concern for possible med reaction due to newly on lexapro/SS, and was advised taper to stop. Note she was fully off of cymbalta for 4 weeks, on lexapro only at time of AMS event. She is also prescribed xanax which she is trying to reduce/avoid daily dosing. She is continuing Buspar  BMI is Body mass index is 26.66 kg/m., she has been working on diet and exercise, walking 2 days a week with granddaughter in stroller, 30 min. Admits poor appetite but making sure she eats, avoiding empty calories, no desserts.  Wt Readings from Last 3 Encounters:  03/11/22 132 lb (59.9 kg)  11/03/21 133 lb 3.2 oz (60.4 kg)  09/03/21 132 lb 12.8 oz (60.2 kg)   She has not been checking BP at home, today their BP is BP: 112/70 She does workout. She denies chest pain, shortness of breath, dizziness.   She is not on cholesterol medication and denies myalgias. Her cholesterol is at goal. The cholesterol last visit was:   Lab Results  Component Value Date   CHOL 199 03/11/2022   HDL 95 03/11/2022   LDLCALC 89 03/11/2022   TRIG 61 03/11/2022   CHOLHDL 2.1 03/11/2022    She has not been working on diet and exercise for glucose management, and denies increased appetite, nausea, paresthesia of the feet, polydipsia, polyuria and visual disturbances. Last A1C in the office was:  Lab Results  Component Value Date   HGBA1C 5.5 03/11/2022   She is on thyroid medication, now following with [name], was switched to armour thyroid and per patient feeling much improved. Her medication was not changed last visit.   Lab Results  Component Value Date   TSH 1.31 03/11/2022   Last GFR: Lab Results  Component Value Date   GFRNONAA 91 11/19/2020    Patient is on Vitamin D supplement.   Lab Results  Component Value Date   VD25OH 19 03/11/2022     She is on B12 supplement;  Lab Results  Component Value Date   GBTDVVOH60 737 09/03/2021     Medication Review: Current Outpatient Medications on File Prior to Visit  Medication Sig Dispense Refill   ARMOUR THYROID 90 MG tablet TAKE 1/2 TABLET BY MOUTH DAILY 45 tablet 3   ascorbic acid (VITAMIN C) 500 MG tablet Take 500 mg by mouth daily.     Cholecalciferol (VITAMIN D3) 5000 units CAPS Take by mouth.     Cyanocobalamin (B12 LIQUID HEALTH BOOSTER PO) Take by mouth daily.     escitalopram (LEXAPRO) 5 MG/5ML solution TAKE 1-10ML BY MOUTH DAILY AS PER SLOW TAPER INSTRUCTIONS. REDUCE DOSE BY 1ML EVERY 2-4 WEEKS AS DIRECTED 240 mL 3   fluticasone (FLONASE) 50 MCG/ACT nasal spray Place 1-2 sprays into both nostrils daily as needed for allergies or rhinitis. 16 g 2   MAGNESIUM GLUCONATE PO Take 400 mg by mouth.     Multiple Vitamin (MULTIVITAMIN ADULT PO) Take by mouth.     Omega-3 Fatty Acids (FISH OIL OMEGA-3 PO) Take 1,200 mg by mouth.     VITAMIN D-VITAMIN K PO Take 1 tablet by mouth daily. Vit D 5000 Iu +K2 100 mcg      busPIRone (BUSPAR) 7.5 MG tablet Take 7.5 mg by mouth 2 (two)  times daily. (Patient not taking: Reported on 03/11/2022)     No current facility-administered medications on file prior to visit.    Allergies  Allergen Reactions   Oxycodone     "Found Oxycodone not effective for pain control" with last RTKA   Other     Opiods,causes severe anxiety    Current Problems (verified) Patient Active Problem List   Diagnosis Date Noted   CKD (chronic kidney disease) stage 2, GFR 60-89 ml/min 08/14/2020   Anxiety    Moderate episode of recurrent major depressive disorder ([place]) 03/27/2020   Other abnormal glucose 05/19/2015   Osteoporosis 05/19/2015   Hyperlipidemia    Hypertension    Hypothyroidism due to Hashimoto's thyroiditis    Vitamin D deficiency    OA  (osteoarthritis) of knee 01/03/2012    Screening Tests Immunization History  Administered Date(s) Administered   Fluad Quad(high Dose 65+) 04/12/2019   Influenza Inj Mdck Quad Pf 05/23/2017   Influenza Split 05/10/2014, 05/19/2015   Influenza, High Dose Seasonal PF 07/02/2018, 08/14/2018, 06/09/2021   Influenza,inj,Quad PF,6+ Mos 03/23/2016   Influenza-Unspecified 04/12/2019, 07/07/2020   PFIZER(Purple Top)SARS-COV-2 Vaccination 09/13/2019, 10/08/2019, 07/07/2020, 02/25/2021   Pneumococcal Conjugate-13 07/09/2019   Pneumococcal Polysaccharide-23 03/14/2018   Td 06/02/2006   Tdap 03/14/2018   Tetanus: 2007, 2019 Pneumovax: 03/2018  Prevnar 13: 07/2019 Flu vaccine: Due 05/2022 Zostavax: declines Covid 19: 3/3, 2021, pfizer  Pap: 03/2018, never abnormal, HPV neg, DONE MGM: 09/2021 DEXA: 08/2020, T-2.6 L fem, [name] is following, did recommend the fosamax that we prescribed for 2 years  Colonoscopy: 01/2019 Dr. Henrene Pastor, 10 year follow up EGD: N/A CTA 09/2012  Names of Other Physician/Practitioners you currently use: 1. [facility] here for primary care 2. Vision: Dr. Bing Plume, last visit 2023 3. Dental: 2023 q 55mo4. Derm: Dr. [facility]ah [facility], new provider, last 2022 total body check - [facility]   Patient Care Team: MUnk Pinto [name] AGaynelle Arabian MD as Consulting Physician (Orthopedic Surgery) DCalvert Cantor MD as Consulting Physician (Ophthalmology) PIrene Shipper MD as Consulting Physician (Gastroenterology) GPhilemon Kingdom MD as Consulting Physician (Internal Medicine) MGarnet Sierras NP as Nurse Practitioner (Adult Health Nurse Practitioner)  SURGICAL HISTORY She  has a past surgical history that includes c sections (jan 1980 and march 1984); ovarian cyst with lararoscopy march 1998 (1998); Total knee arthroplasty (01/03/2012); Wisdom tooth extraction (09-05-12); Total knee  arthroplasty (Left, 09/11/2012); Cataract extraction, bilateral (Bilateral, 2016); and Colonoscopy. FAMILY HISTORY Her family history includes Arthritis in her mother; Breast cancer in her maternal grandmother and paternal grandmother; COPD in her father; Cancer in her mother; Hypertension in her mother; Stroke (age of onset: 949 in her maternal grandmother; Thyroid disease in her mother. SOCIAL HISTORY She  reports that she has never smoked. She has never used smokeless tobacco. She reports that she does not currently use alcohol. She reports that she does not use drugs.   MEDICARE WELLNESS OBJECTIVES: Physical activity:   Cardiac risk factors:   Depression/mood screen:      09/03/2021    2:19 PM  Depression screen PHQ 2/9  Decreased Interest 0  Down, Depressed, Hopeless 3  PHQ - 2 Score 3  Altered sleeping 0  Tired, decreased energy 0  Change in appetite 0  Feeling bad or failure about yourself  0  Trouble concentrating 1  Moving slowly or fidgety/restless 0  Suicidal thoughts 0  PHQ-9 Score 4  Difficult  doing work/chores Somewhat difficult    ADLs:     03/15/2022    7:24 AM  In your present state of health, do you have any difficulty performing the following activities:  Hearing? 0  Vision? 0  Difficulty concentrating or making decisions? 0  Walking or climbing stairs? 0  Dressing or bathing? 0  Doing errands, shopping? 0  Preparing Food and eating ? N  Using the Toilet? N  In the past six months, have you accidently leaked urine? N  Do you have problems with loss of bowel control? N  Managing your Medications? N  Managing your Finances? N  Housekeeping or managing your Housekeeping? N     Cognitive Testing  Alert? Yes  Normal Appearance?Yes  Oriented to person? Yes  Place? Yes   Time? Yes  Recall of three objects?  Yes  Can perform simple calculations? Yes  Displays appropriate judgment?Yes  Can read the correct time from a watch face?Yes  EOL planning: Does  Patient Have a Medical Advance Directive?: No    Review of Systems  Constitutional:  Negative for malaise/fatigue and weight loss.  HENT:  Negative for hearing loss and tinnitus.   Eyes:  Negative for blurred vision and double vision.  Respiratory:  Negative for cough, sputum production, shortness of breath and wheezing.   Cardiovascular:  Negative for chest pain, palpitations, orthopnea, claudication, leg swelling and PND.  Gastrointestinal:  Negative for abdominal pain, blood in stool, constipation, diarrhea, heartburn, melena, nausea and vomiting.  Genitourinary: Negative.   Musculoskeletal:  Negative for falls, joint pain and myalgias.  Skin:  Negative for rash.  Neurological:  Negative for dizziness, tingling, sensory change, weakness and headaches.  Endo/Heme/Allergies:  Negative for polydipsia.  Psychiatric/Behavioral:  Positive for depression. Negative for memory loss, substance abuse and suicidal ideas. The patient is not nervous/anxious and does not have insomnia.   All other systems reviewed and are negative.    Objective:     Today's Vitals   03/11/22 1614  BP: 112/70  Pulse: 64  Temp: 97.9 F (36.6 C)  SpO2: 98%  Weight: 132 lb (59.9 kg)  Height: '4\' 11"'$  (1.499 m)   Body mass index is 26.66 kg/m.  General appearance: alert, no distress, WD/WN, female HEENT: normocephalic, sclerae anicteric, TMs pearly, nares patent, no discharge or erythema, pharynx normal Oral cavity: MMM, no lesions Neck: supple, no lymphadenopathy, no thyromegaly, no masses Heart: RRR, normal S1, S2, no murmurs Lungs: CTA bilaterally, no wheezes, rhonchi, or rales Abdomen: +bs, soft, non tender, non distended, no masses, no hepatomegaly, no splenomegaly Musculoskeletal: nontender, no swelling, no obvious deformity Extremities: no edema, no cyanosis, no clubbing Pulses: 2+ symmetric, upper and lower extremities, normal cap refill Neurological: alert, oriented x 3, CN2-12 intact, strength  normal upper extremities and lower extremities, sensation normal throughout, DTRs 2+ throughout, no cerebellar signs, gait normal Psychiatric: normal affect, behavior normal, pleasant   Medicare Attestation I have personally reviewed: The patient's medical and social history Their use of alcohol, tobacco or illicit drugs Their current medications and supplements The patient's functional ability including ADLs,fall risks, home safety risks, cognitive, and hearing and visual impairment Diet and physical activities Evidence for depression or mood disorders  The patient's weight, height, BMI, and visual acuity have been recorded in the chart.  I have made referrals, counseling, and provided education to the patient based on review of the above and I have provided the patient with a written personalized care plan for preventive services.  Darrol Jump, NP   03/15/2022

## 2022-03-12 LAB — COMPLETE METABOLIC PANEL WITH GFR
AG Ratio: 1.5 (calc) (ref 1.0–2.5)
ALT: 20 U/L (ref 6–29)
AST: 20 U/L (ref 10–35)
Albumin: 4.3 g/dL (ref 3.6–5.1)
Alkaline phosphatase (APISO): 70 U/L (ref 37–153)
BUN: 15 mg/dL (ref 7–25)
CO2: 28 mmol/L (ref 20–32)
Calcium: 9.3 mg/dL (ref 8.6–10.4)
Chloride: 103 mmol/L (ref 98–110)
Creat: 0.83 mg/dL (ref 0.50–1.05)
Globulin: 2.8 g/dL (calc) (ref 1.9–3.7)
Glucose, Bld: 87 mg/dL (ref 65–99)
Potassium: 4.8 mmol/L (ref 3.5–5.3)
Sodium: 140 mmol/L (ref 135–146)
Total Bilirubin: 0.6 mg/dL (ref 0.2–1.2)
Total Protein: 7.1 g/dL (ref 6.1–8.1)
eGFR: 76 mL/min/{1.73_m2} (ref >=60)

## 2022-03-12 LAB — LIPID PANEL
Cholesterol: 199 mg/dL (ref <200)
HDL: 95 mg/dL (ref >=50)
LDL Cholesterol (Calc): 89 mg/dL (calc)
Non-HDL Cholesterol (Calc): 104 mg/dL (calc) (ref <130)
Total CHOL/HDL Ratio: 2.1 (calc) (ref <5.0)
Triglycerides: 61 mg/dL (ref <150)

## 2022-03-12 LAB — CBC WITH DIFFERENTIAL/PLATELET
Absolute Monocytes: 691 cells/uL (ref 200–950)
Basophils Absolute: 58 cells/uL (ref 0–200)
Basophils Relative: 0.9 %
Eosinophils Absolute: 90 cells/uL (ref 15–500)
Eosinophils Relative: 1.4 %
HCT: 42.1 % (ref 35.0–45.0)
Hemoglobin: 14.3 g/dL (ref 11.7–15.5)
Lymphs Abs: 2355 cells/uL (ref 850–3900)
MCH: 31.2 pg (ref 27.0–33.0)
MCHC: 34 g/dL (ref 32.0–36.0)
MCV: 91.9 fL (ref 80.0–100.0)
MPV: 9.7 fL (ref 7.5–12.5)
Monocytes Relative: 10.8 %
Neutro Abs: 3206 cells/uL (ref 1500–7800)
Neutrophils Relative %: 50.1 %
Platelets: 279 10*3/uL (ref 140–400)
RBC: 4.58 10*6/uL (ref 3.80–5.10)
RDW: 12.7 % (ref 11.0–15.0)
Total Lymphocyte: 36.8 %
WBC: 6.4 10*3/uL (ref 3.8–10.8)

## 2022-03-12 LAB — HEMOGLOBIN A1C
Hgb A1c MFr Bld: 5.5 % of total Hgb (ref <5.7)
Mean Plasma Glucose: 111 mg/dL
eAG (mmol/L): 6.2 mmol/L

## 2022-03-12 LAB — TSH: TSH: 1.31 mIU/L (ref 0.40–4.50)

## 2022-03-12 LAB — VITAMIN D 25 HYDROXY (VIT D DEFICIENCY, FRACTURES): Vit D, 25-Hydroxy: 75 ng/mL (ref 30–100)

## 2022-03-25 DIAGNOSIS — F330 Major depressive disorder, recurrent, mild: Secondary | ICD-10-CM | POA: Diagnosis not present

## 2022-03-25 DIAGNOSIS — F411 Generalized anxiety disorder: Secondary | ICD-10-CM | POA: Diagnosis not present

## 2022-04-01 ENCOUNTER — Encounter: Payer: Self-pay | Admitting: Internal Medicine

## 2022-04-29 ENCOUNTER — Encounter: Payer: Self-pay | Admitting: Podiatry

## 2022-04-29 ENCOUNTER — Ambulatory Visit (INDEPENDENT_AMBULATORY_CARE_PROVIDER_SITE_OTHER): Payer: Medicare Other | Admitting: Podiatry

## 2022-04-29 ENCOUNTER — Ambulatory Visit (INDEPENDENT_AMBULATORY_CARE_PROVIDER_SITE_OTHER): Payer: Medicare Other

## 2022-04-29 DIAGNOSIS — M84374A Stress fracture, right foot, initial encounter for fracture: Secondary | ICD-10-CM

## 2022-04-29 DIAGNOSIS — M79671 Pain in right foot: Secondary | ICD-10-CM

## 2022-04-29 NOTE — Progress Notes (Signed)
Subjective:   Patient ID: Michelle Miranda, female   DOB: 71 y.o.   MRN: [id number]   HPI Patient presents stating she has had a lot of pain in her right foot x6 weeks and its been swollen and hard to walk on.  She does not remember injury to the foot   ROS      Objective:  Physical Exam  Neurovascular status intact negative Homans' sign noted midfoot swelling right extending into the forefoot with pain mostly along the metatarsal shaft with laboratory condition     Assessment:  Possibility for stress fracture versus inflammatory tendinitis or possible vein issue right     Plan:  H&P x-rays reviewed advised on stress fracture and rigid bottom shoes and compression ice therapy.  Discussed the possibility for subsequent fracture of the adjacent bone and educated her on this and patient will be seen back as needed  Severe stress fracture midshaft third metatarsal right showing multiple signs of healing

## 2022-04-30 ENCOUNTER — Other Ambulatory Visit: Payer: Self-pay | Admitting: Podiatry

## 2022-04-30 DIAGNOSIS — M84374A Stress fracture, right foot, initial encounter for fracture: Secondary | ICD-10-CM

## 2022-05-06 DIAGNOSIS — F330 Major depressive disorder, recurrent, mild: Secondary | ICD-10-CM | POA: Diagnosis not present

## 2022-05-06 DIAGNOSIS — F411 Generalized anxiety disorder: Secondary | ICD-10-CM | POA: Diagnosis not present

## 2022-05-21 ENCOUNTER — Encounter: Payer: Self-pay | Admitting: Nurse Practitioner

## 2022-05-23 DIAGNOSIS — Z23 Encounter for immunization: Secondary | ICD-10-CM | POA: Diagnosis not present

## 2022-06-03 ENCOUNTER — Ambulatory Visit (INDEPENDENT_AMBULATORY_CARE_PROVIDER_SITE_OTHER): Payer: Medicare Other | Admitting: Internal Medicine

## 2022-06-03 ENCOUNTER — Encounter: Payer: Self-pay | Admitting: Internal Medicine

## 2022-06-03 VITALS — BP 128/78 | HR 76 | Ht 59.0 in | Wt 130.0 lb

## 2022-06-03 DIAGNOSIS — E038 Other specified hypothyroidism: Secondary | ICD-10-CM | POA: Diagnosis not present

## 2022-06-03 DIAGNOSIS — E063 Autoimmune thyroiditis: Secondary | ICD-10-CM | POA: Diagnosis not present

## 2022-06-03 DIAGNOSIS — M810 Age-related osteoporosis without current pathological fracture: Secondary | ICD-10-CM | POA: Diagnosis not present

## 2022-06-03 NOTE — Patient Instructions (Addendum)
Please continue Armour 45 mg daily.  Take the thyroid hormone every day, with water, at least 30 minutes before breakfast, separated by at least 4 hours from: - acid reflux medications - calcium - iron - multivitamins  Try to have a bone density in 09/2022.  Please return in 6 months.

## 2022-06-03 NOTE — Progress Notes (Signed)
Patient ID: Michelle Miranda, female   DOB: March 25, 1951, 71 y.o.   MRN: [id number]   HPI  Michelle Miranda is a 71 y.o.-year-old female, initially referred by her PCP, Vicie Mutters, NP (Dr. Melford Aase), returning for follow-up for Hashimoto's hypothyroidism and osteoporosis. Last visit 6 months ago.  Interim history: No falls or fractures since last visit. No dizziness/vertigo/orthostasis. She has anxiety, also depression, brain fog. She was changed from Cymbalta to Lexapro due to breakthrough symptoms.  However, she did not notice significant improvement.  Hashimoto's hypothyroidism: Reviewed history: Pt. has been dx with hypothyroidism in 2012 and Hashimoto's thyroiditis in 04/2016 >> prev. On Levothyroxine, then on Synthroid d.a.w. 37.5 mcg 6/7 and 75 mcg 1/7 (changed in 01/2016).  Latest thyroid dose change was to 50 mcg daily and she has maintained normal TFTs afterwards.    We changed from levothyroxine to Armour 01/2019.  She feels much better on Armour, with less fatigue.  She takes Armour 45 mg daily: - in am - fasting - at least 30 min from b'fast - no Ca, Fe,  PPIs, + MVI more than 4 hours later  She was previously on selenium 200 mcg daily, but now off.  Reviewed her TFTs: Lab Results  Component Value Date   TSH 1.31 03/11/2022   TSH 1.11 09/03/2021   TSH 1.59 03/26/2021   TSH 1.86 10/27/2020   TSH 1.59 08/13/2020   TSH 1.40 05/07/2020   TSH 2.06 02/11/2020   TSH 1.71 10/17/2019   TSH 3.29 08/23/2019   TSH 2.50 06/13/2019   FREET4 0.82 10/27/2020   FREET4 1.1 08/13/2020   FREET4 0.77 05/07/2020   FREET4 0.70 08/23/2019   FREET4 0.61 06/13/2019   FREET4 0.76 02/26/2019   FREET4 0.90 02/10/2018   FREET4 0.74 02/10/2017   FREET4 0.70 05/18/2016   FREET4 0.79 04/09/2016    She has a history of Hashimoto thyroiditis based on elevated TPO antibodies: Component     Latest Ref Rng & Units 04/09/2016  Thyroperoxidase Ab SerPl-aCnc     <9 IU/mL 9 (H)  Thyroglobulin Ab      <2 IU/mL <1   Pt denies: - feeling nodules in neck - hoarseness - dysphagia But she has occasional choking, which is chronic.  She has + FH of thyroid disorders in: mother - hypothyroid. No FH of thyroid cancer. No h/o radiation tx to head or neck. On Magnesium. No recent steroids use.   Osteoporosis:  Pt was dx with OP in 08/2020.  I reviewed pt's DXA scans: Date Spine FN T score 33% distal Radius  [facility]22/[facility] (L1, L2) L3-L4: -1.3 (-7.1%*) RFN: -2.2 LFN: -2.6 n/a  07/21/2018 ([facility]) n/a RFN: -2.0 LFN: -2.4 -1.4  07/18/2015 ([facility]) L1-L4 (L3): -1.6 RFN: -1.8 LFN: -2.2 n/a   She does have height loss.  Previous OP treatments:  - Evista  ~2005 >>  08/2020 - Fosamax - suggested 09/2020, but not started  She does [employer] (at [employer]) and using a vibration platform to help with osteoporosis.  She does not have dental work coming up.  No h/o vitamin D deficiency. Reviewed available vit D levels: Lab Results  Component Value Date   VD25OH 75 03/11/2022   VD25OH 73 09/03/2021   VD25OH 70 03/26/2021   VD25OH 79 08/13/2020   VD25OH 68 02/11/2020   VD25OH 48 07/09/2019   VD25OH 71 03/14/2018   VD25OH 57 07/06/2016   VD25OH 60 02/23/2016   VD25OH 39  05/19/2015   Pt is on: - vitamin D 5000 units + vitamin K2  daily   She takes 1500 mg calcium from food.  She drinks Oatmilk.  No weight bearing exercises. Used to do Pilates >> stopped >> now once a week. Now a new grandson - lifting him. Also has a 69 y/o granddaughter.  She does not take high vitamin A doses.  Menopause was at 71 y/o.   + FH of osteoporosis: In mother.  No h/o persistent hyper/hypocalcemia or hyperparathyroidism. No h/o kidney stones. Lab Results  Component Value Date   CALCIUM 9.3 03/11/2022   CALCIUM 9.2 09/03/2021   CALCIUM 9.1 03/26/2021   CALCIUM 9.3 11/19/2020   CALCIUM 9.2 08/13/2020   CALCIUM 8.6 (L) 05/21/2020    CALCIUM 9.5 03/06/2020   CALCIUM 9.3 02/11/2020   CALCIUM 9.2 10/17/2019   CALCIUM 9.0 07/09/2019   No h/o CKD. Last BUN/Cr: Lab Results  Component Value Date   BUN 15 03/11/2022   CREATININE 0.83 03/11/2022   She has a h/o serotonin sd. From Cymbalta - 05/2020.  ROS: + see HPI Neurological: + tremors/no numbness/no tingling/no dizziness  I reviewed pt's medications, allergies, PMH, social hx, family hx, and changes were documented in the history of present illness. Otherwise, unchanged from my initial visit note.  Past Medical History:  Diagnosis Date   Altered mental status 05/28/2020   Anxiety    occasional   Arthritis    Depression    Hyperlipidemia    Hypertension    Hypothyroid    hypo thyroid   Osteopenia    Urinary tract infection 12/15/2011   once   Vitamin D deficiency    Past Surgical History:  Procedure Laterality Date   c sections  jan 1980 and march 1984   CATARACT EXTRACTION, BILATERAL Bilateral 2016   Dr. Bing Plume   COLONOSCOPY     ovarian cyst with lararoscopy march 1998  1998   [place]  01/03/2012   Procedure: TOTAL KNEE ARTHROPLASTY;  Surgeon: Gearlean Alf, MD;  Location: [facility];  Service: Orthopedics;  Laterality: Right;   TOTAL KNEE ARTHROPLASTY Left 09/11/2012   Procedure: Left Total Knee Arthroplasty;  Surgeon: Gearlean Alf, MD;  Location: [facility];  Service: Orthopedics;  Laterality: Left;   WISDOM TOOTH EXTRACTION  09-05-12   extractions   Social History   Social History   Marital status: Married    Spouse name: N/A   Number of children: 2   Occupational History   Development worker, international aid   Social History Main Topics   Smoking status: Never Smoker   Smokeless tobacco: Never Used   Alcohol use Yes     Comment: 1-2 glass wine night   Drug use: No   Current Outpatient Medications on File Prior to Visit  Medication Sig Dispense Refill   ALPRAZolam (XANAX) 0.5 MG tablet Take by mouth.     ARMOUR THYROID 90 MG tablet TAKE 1/2  TABLET BY MOUTH DAILY 45 tablet 3   Cyanocobalamin (B12 LIQUID HEALTH BOOSTER PO) Take by mouth daily.     escitalopram (LEXAPRO) 5 MG/5ML solution TAKE 1-10ML BY MOUTH DAILY AS PER SLOW TAPER INSTRUCTIONS. REDUCE DOSE BY 1ML EVERY 2-4 WEEKS AS DIRECTED 240 mL 3   fluticasone (FLONASE) 50 MCG/ACT nasal spray Place 1-2 sprays into both nostrils daily as needed for allergies or rhinitis. 16 g 2   folic acid (FOLVITE) 1 MG tablet Take 1 mg by mouth daily.  MAGNESIUM GLUCONATE PO Take 400 mg by mouth.     Multiple Vitamin (MULTIVITAMIN ADULT PO) Take by mouth.     Omega-3 Fatty Acids (FISH OIL OMEGA-3 PO) Take 1,200 mg by mouth.     VITAMIN D-VITAMIN K PO Take 1 tablet by mouth daily. Vit D 5000 Iu +K2 100 mcg      No current facility-administered medications on file prior to visit.   Allergies  Allergen Reactions   Oxycodone     "Found Oxycodone not effective for pain control" with last RTKA   Other     Opiods,causes severe anxiety   Family History  Problem Relation Age of Onset   Hypertension Mother    Thyroid disease Mother    Arthritis Mother    Cancer Mother        gallbladder cancer   COPD Father    Breast cancer Maternal Grandmother        35s   Stroke Maternal Grandmother 92   Breast cancer Paternal Grandmother    Colon cancer Neg Hx    Esophageal cancer Neg Hx    Stomach cancer Neg Hx    Rectal cancer Neg Hx    PE: BP 128/78 (BP Location: Left Arm, Patient Position: Sitting, Cuff Size: Normal)   Pulse 76   Ht '4\' 11"'$  (1.499 m)   Wt 130 lb (59 kg)   SpO2 99%   BMI 26.26 kg/m  Wt Readings from Last 3 Encounters:  06/03/22 130 lb (59 kg)  03/11/22 132 lb (59.9 kg)  11/03/21 133 lb 3.2 oz (60.4 kg)   Constitutional: normal weight, in NAD Eyes: EOMI, no exophthalmos ENT:no thyromegaly, no cervical lymphadenopathy Cardiovascular: RRR, No MRG Respiratory: CTA B Musculoskeletal: no deformities Skin: moist, warm, no rashes Neurological: + Chronic tremor with  outstretched hands  ASSESSMENT: 1. Hypothyroidism 2/2 Hashimoto's thyroiditis  2.  Osteoporosis (OP)  PLAN:  1. Patient with longstanding Hashimoto's hypothyroidism, on Armour thyroid due to persistent fatigue on levothyroxine.  She feels much better on Armour, with more energy. - latest thyroid labs reviewed with pt. >> normal: Lab Results  Component Value Date   TSH 1.31 03/11/2022  - she continues on Armour 45 mg daily - pt feels good on this dose and would not want to switch back to levothyroxine.  She does complain of anxiety, which is longstanding, and exacerbated by her husband's passing.  She also describes thoughts racing in the morning before going out of bed.  She was worried that this may be a sign of excess cortisol.  We discussed that stress will increase her cortisol and the highest cortisol levels are in the morning.  However, she has no other signs of Cushing syndrome including no weight gain, muscle weakness, diabetes, hypertension.  We discussed that the T3 thyroid Armour can cause anxiety, but she felt that her anxiety started before taking her Armour in the morning. -She lost a significant amount of weight: 19 pounds in 2021 (partly intentional and partly due to the death of her husband), and 33 pounds before our visit in 2022.  Before last visit, she gained 4 pounds. She then lost 2 more lbs. - we discussed about taking the thyroid hormone every day, with water, >30 minutes before breakfast, separated by >4 hours from acid reflux medications, calcium, iron, multivitamins. Pt. is taking it correctly. - will check thyroid tests at next visit  2. OP -Likely postmenopausal/age-related, possibly also due to low calcium intake.  She also has family  history of osteoporosis -No history of fragility fractures.  She did have a toe fracture, but this does not qualify as an osteoporotic fracture. -We discussed about getting 1000-1200 mg of calcium daily preferentially from the diet  and also maintaining a normal vitamin D level.  Latest vitamin D level was 09/2021: 73.  She is on 5000 units vitamin D daily.  She also added vitamin K2 before last visit.  She is drinking oat milk. -At last visit I gave her a handout of recommendations for exercise from the National osteoporosis foundation.  I advised her to do weightbearing exercises at least 5 times a week.  At last visit she did not start that she was very active taking care of her grandchildren. -She was previously on Evista for a long time and we discussed that this was helping with osteoporosis.  However, she came off in 2022.  At that time, we discussed about different osteoporosis medication classes, mechanism of action, and possible side effects.  She was previously prescribed Fosamax, which she had at home but did not start.  This visit I advised her to start this, but she wanted to wait until the next bone density to make a decision as she had 3 friends that had side effects from these medication.   -She is due for another bone density scan after 08/2022. First indication that the treatment is working is her not having fractures.  T-score changes are secondary: Unchanged or slightly higher T-scores are desirable -At next visit we will check a vitamin D level -I will see her back in 6 months, but we will be in touch with her about the bone density results.  Philemon Kingdom, MD PhD [facility] Endocrin[facility]

## 2022-06-17 ENCOUNTER — Ambulatory Visit (INDEPENDENT_AMBULATORY_CARE_PROVIDER_SITE_OTHER): Payer: Medicare Other | Admitting: Podiatry

## 2022-06-17 ENCOUNTER — Encounter: Payer: Self-pay | Admitting: Podiatry

## 2022-06-17 ENCOUNTER — Ambulatory Visit (INDEPENDENT_AMBULATORY_CARE_PROVIDER_SITE_OTHER): Payer: Medicare Other

## 2022-06-17 DIAGNOSIS — M84374A Stress fracture, right foot, initial encounter for fracture: Secondary | ICD-10-CM

## 2022-06-17 DIAGNOSIS — M84374D Stress fracture, right foot, subsequent encounter for fracture with routine healing: Secondary | ICD-10-CM

## 2022-06-17 DIAGNOSIS — F330 Major depressive disorder, recurrent, mild: Secondary | ICD-10-CM | POA: Diagnosis not present

## 2022-06-17 DIAGNOSIS — F411 Generalized anxiety disorder: Secondary | ICD-10-CM | POA: Diagnosis not present

## 2022-06-17 NOTE — Progress Notes (Signed)
Subjective:   Patient ID: Michelle Miranda, female   DOB: 71 y.o.   MRN: [id number]   HPI Patient states that the pain has improved in the right foot but wants to make sure that everything else is doing okay   ROS      Objective:  Physical Exam  Neurovascular status intact with continued edema in the right forefoot with reduced discomfort with pressure and reduce swelling noted with condition     Assessment:  Fracture right second metatarsal appears to be healing at this time     Plan:  H&P x-ray reviewed and went ahead discussed gradual increase in activity ice therapy and gradual increase in shoe gear.  Patient will be seen back as needed hopefully this will be the end of this problem

## 2022-07-01 DIAGNOSIS — H43813 Vitreous degeneration, bilateral: Secondary | ICD-10-CM | POA: Diagnosis not present

## 2022-07-01 DIAGNOSIS — H524 Presbyopia: Secondary | ICD-10-CM | POA: Diagnosis not present

## 2022-07-01 DIAGNOSIS — Z961 Presence of intraocular lens: Secondary | ICD-10-CM | POA: Diagnosis not present

## 2022-07-01 DIAGNOSIS — H52223 Regular astigmatism, bilateral: Secondary | ICD-10-CM | POA: Diagnosis not present

## 2022-07-01 DIAGNOSIS — H31091 Other chorioretinal scars, right eye: Secondary | ICD-10-CM | POA: Diagnosis not present

## 2022-07-01 DIAGNOSIS — H35373 Puckering of macula, bilateral: Secondary | ICD-10-CM | POA: Diagnosis not present

## 2022-08-05 DIAGNOSIS — F330 Major depressive disorder, recurrent, mild: Secondary | ICD-10-CM | POA: Diagnosis not present

## 2022-08-05 DIAGNOSIS — F411 Generalized anxiety disorder: Secondary | ICD-10-CM | POA: Diagnosis not present

## 2022-09-02 ENCOUNTER — Ambulatory Visit: Payer: Medicare Other | Admitting: Internal Medicine

## 2022-09-02 DIAGNOSIS — F411 Generalized anxiety disorder: Secondary | ICD-10-CM | POA: Diagnosis not present

## 2022-09-02 DIAGNOSIS — F330 Major depressive disorder, recurrent, mild: Secondary | ICD-10-CM | POA: Diagnosis not present

## 2022-09-07 ENCOUNTER — Encounter: Payer: Medicare Other | Admitting: Nurse Practitioner

## 2022-09-09 ENCOUNTER — Other Ambulatory Visit: Payer: Self-pay | Admitting: Internal Medicine

## 2022-09-09 DIAGNOSIS — Z1231 Encounter for screening mammogram for malignant neoplasm of breast: Secondary | ICD-10-CM

## 2022-09-15 ENCOUNTER — Other Ambulatory Visit: Payer: Self-pay | Admitting: Internal Medicine

## 2022-09-15 ENCOUNTER — Encounter: Payer: Self-pay | Admitting: Nurse Practitioner

## 2022-09-15 MED ORDER — AZITHROMYCIN 250 MG PO TABS: ORAL_TABLET | ORAL | 1 refills | Status: DC

## 2022-09-15 MED ORDER — BENZONATATE 200 MG PO CAPS: ORAL_CAPSULE | ORAL | 1 refills | Status: DC

## 2022-09-15 MED ORDER — DEXAMETHASONE 4 MG PO TABS: ORAL_TABLET | ORAL | 0 refills | Status: DC

## 2022-09-15 MED ORDER — PROMETHAZINE-DM 6.25-15 MG/5ML PO SYRP: ORAL_SOLUTION | ORAL | 1 refills | Status: DC

## 2022-09-17 ENCOUNTER — Other Ambulatory Visit: Payer: Self-pay | Admitting: Internal Medicine

## 2022-09-17 DIAGNOSIS — E038 Other specified hypothyroidism: Secondary | ICD-10-CM

## 2022-09-21 ENCOUNTER — Ambulatory Visit
Admission: RE | Admit: 2022-09-21 | Discharge: 2022-09-21 | Disposition: A | Discharge status: Home / Self Care | Payer: Medicare Other | Source: Ambulatory Visit | Attending: Internal Medicine | Admitting: Internal Medicine

## 2022-09-21 DIAGNOSIS — Z1231 Encounter for screening mammogram for malignant neoplasm of breast: Secondary | ICD-10-CM | POA: Diagnosis not present

## 2022-09-23 ENCOUNTER — Encounter: Payer: Self-pay | Admitting: Nurse Practitioner

## 2022-09-23 ENCOUNTER — Ambulatory Visit (INDEPENDENT_AMBULATORY_CARE_PROVIDER_SITE_OTHER): Payer: Medicare Other | Admitting: Nurse Practitioner

## 2022-09-23 VITALS — BP 112/72 | HR 66 | Temp 98.1°F | Ht 59.25 in | Wt 127.2 lb

## 2022-09-23 DIAGNOSIS — E063 Autoimmune thyroiditis: Secondary | ICD-10-CM

## 2022-09-23 DIAGNOSIS — Z0001 Encounter for general adult medical examination with abnormal findings: Secondary | ICD-10-CM

## 2022-09-23 DIAGNOSIS — E785 Hyperlipidemia, unspecified: Secondary | ICD-10-CM

## 2022-09-23 DIAGNOSIS — N182 Chronic kidney disease, stage 2 (mild): Secondary | ICD-10-CM | POA: Diagnosis not present

## 2022-09-23 DIAGNOSIS — I10 Essential (primary) hypertension: Secondary | ICD-10-CM

## 2022-09-23 DIAGNOSIS — F419 Anxiety disorder, unspecified: Secondary | ICD-10-CM

## 2022-09-23 DIAGNOSIS — F331 Major depressive disorder, recurrent, moderate: Secondary | ICD-10-CM

## 2022-09-23 DIAGNOSIS — J019 Acute sinusitis, unspecified: Secondary | ICD-10-CM

## 2022-09-23 DIAGNOSIS — Z6826 Body mass index (BMI) 26.0-26.9, adult: Secondary | ICD-10-CM

## 2022-09-23 DIAGNOSIS — Z79899 Other long term (current) drug therapy: Secondary | ICD-10-CM

## 2022-09-23 DIAGNOSIS — M170 Bilateral primary osteoarthritis of knee: Secondary | ICD-10-CM

## 2022-09-23 DIAGNOSIS — Z136 Encounter for screening for cardiovascular disorders: Secondary | ICD-10-CM

## 2022-09-23 DIAGNOSIS — E038 Other specified hypothyroidism: Secondary | ICD-10-CM

## 2022-09-23 DIAGNOSIS — R7309 Other abnormal glucose: Secondary | ICD-10-CM | POA: Diagnosis not present

## 2022-09-23 DIAGNOSIS — M810 Age-related osteoporosis without current pathological fracture: Secondary | ICD-10-CM

## 2022-09-23 DIAGNOSIS — E559 Vitamin D deficiency, unspecified: Secondary | ICD-10-CM | POA: Diagnosis not present

## 2022-09-23 MED ORDER — AZITHROMYCIN 250 MG PO TABS: ORAL_TABLET | ORAL | 1 refills | Status: DC

## 2022-09-23 NOTE — Progress Notes (Signed)
CPE  Assessment:   Encounter for Annual Physical Exam with abnormal findings Due annually  Health Maintenance reviewed Healthy lifestyle reviewed and goals set Check with insurance about shingrix   Essential hypertension Diet controlled Discussed DASH (Dietary Approaches to Stop Hypertension) DASH diet is lower in sodium than a typical American diet. Cut back on foods that are high in saturated fat, cholesterol, and trans fats. Eat more whole-grain foods, fish, poultry, and nuts Remain active and exercise as tolerated daily.  Monitor BP at home-Call if greater than 130/80.  Check CMP/CBC   Other specified hypothyroidism Controlled Managed by [name], now on armour thyroid and much improved.  Reminded to take on an empty stomach 30-59mns before food.  Stop any Biotin Supplement 48-72 hours before next TSH level to reduce the risk of falsely low TSH levels. Continue to monitor.    Hyperlipidemia Continue Omega 3 fish oil, Vitamin K Discussed lifestyle modifications. Recommended diet heavy in fruits and veggies, omega 3's. Decrease consumption of animal meats, cheeses, and dairy products. Remain active and exercise as tolerated. Continue to monitor. Check lipids/TSH  Depression, major, active ([place] - with anxiety and grief reaction Currently discussed TMS therapy - may start 10/2022 Continue Escitalopram, Alprazolam PRN - limit use Following with psych and counseling due to med intolerance Lifestyle discussed: diet/exerise, sleep hygiene, stress management, hydration  Other abnormal glucose Education: Reviewed 'ABCs' of diabetes management  Discussed goals to be met and/or maintained include A1C (<7) Blood pressure (<130/80) Cholesterol (LDL <70) Continue Eye Exam yearly  Continue Dental Exam Q6 mo Discussed dietary recommendations Discussed Physical Activity recommendations Check A1C  Vitamin D deficiency At goal at recent check; continue to recommend  supplementation for goal of 70-100 Check vitamin D   Osteoarthritis of both knees, unspecified osteoarthritis type Continue weight loss, better after surgery  Osteopenia Repeat DEXA  - ordered Dr. GCruzita Ledererfollowing as well  Pursue a combination of weight-bearing exercises and strength training. Advised on fall prevention measures including proper lighting in all rooms, removal of area rugs and floor clutter, use of walking devices as deemed appropriate, avoidance of uneven walking surfaces. Smoking cessation and moderate alcohol consumption if applicable Consume [id number] 1000 IU of vitamin D daily with a goal vitamin D serum value of 30 ng/mL or higher. Aim for 1000 to 1200 mg of elemental calcium daily through supplements and/or dietary sources.   Medication management All medications discussed and reviewed in full. All questions and concerns regarding medications addressed.    BMI 26  Discussed appropriate BMI Diet modification. Physical activity. Encouraged/praised to build confidence.  CKD Discussed how what you eat and drink can aide in kidney protection. Stay well hydrated. Avoid high salt foods. Avoid NSAIDS. Keep BP and BG well controlled.   Take medications as prescribed. Remain active and exercise as tolerated daily. Maintain weight.  Continue to monitor. Check CMP/GFR/Microablumin  Acute Sinusitis Start Azithromycin as directed. Mucinex sample provided. Stay well hydrated to keep mucus thin and productive. Continue to monitor  Orders Placed This Encounter  Procedures   DG Bone Density    Standing Status:   Future    Standing Expiration Date:   09/24/2023    Order Specific Question:   Reason for Exam (SYMPTOM  OR DIAGNOSIS REQUIRED)    Answer:   Osteopenia    Order Specific Question:   Preferred imaging location?    Answer:   [facility]   CBC with Differential/Platelet   COMPLETE METABOLIC PANEL WITH GFR  Magnesium   Lipid panel   TSH    Hemoglobin A1c   Insulin, random   VITAMIN D 25 Hydroxy (Vit-D Deficiency, Fractures)   Urinalysis, Routine w reflex microscopic   Microalbumin / creatinine urine ratio   EKG 12-Lead   Meds ordered this encounter  Medications   azithromycin (ZITHROMAX) 250 MG tablet    Sig: Take 2 tablets with Food on  Day 1, then 1 tablet Daily with Food for Sinusitis / Bronchitis    Dispense:  6 each    Refill:  1    Order Specific Question:   Supervising Provider    Answer:   Unk Pinto [phone]   Future Appointments  Date Time Provider [place]  12/02/2022  2:20 PM Philemon Kingdom, MD [facility] None  12/23/2022  4:00 PM Darrol Jump, NP [facility] None  09/27/2023 10:00 AM Darrol Jump, NP [facility] None     Plan:   During the course of the visit the patient was educated and counseled about appropriate screening and preventive services including:   Pneumococcal vaccine  Prevnar 13 Influenza vaccine Td vaccine Screening electrocardiogram Bone densitometry screening Colorectal cancer screening Diabetes screening Glaucoma screening Nutrition counseling  Advanced directives: requested   Subjective:  Michelle Miranda is a 72 y.o. female who presents for AWV. She has OA (osteoarthritis) of knee; Hyperlipidemia; Hypertension; Hypothyroidism due to Hashimoto's thyroiditis; Vitamin D deficiency; Other abnormal glucose; Osteoporosis; Moderate episode of recurrent major depressive disorder ([place]); Anxiety; and CKD (chronic kidney disease) stage 2, GFR 60-89 ml/min on their problem list.   She is widowed. First grandchild (girl) arrived in 2019, doing well, watches her during the day a few days a week. New grandson scheduled for C section tomorrow. She is retired Barista.  She has dx of recurrent depression/anxiety on cymbalta for many years; she abruptly lost husband to cancer in [id number] with complicated grief, severe panic attacks with excess xanax use. We  attempted taper off of cymbalta to try lexapro, but was evaluated 05/21/2020 in ED for AMS, unremarkable workup with concern for SS, was advised to taper to stop. She saw Dr. Jannifer Franklin and had normal EEG 06/09/2020. She is now following with Debby Bud, NP, did genomic testing and apparently numerous intolerances. Also following with a counselor with perceived benefit. Per her strong request she is on lexapro liquid formulation and doing very slow taper and does feel doing well with this. She is currently on 2.4 mL.  She plans to start [place] therapy in 10/2022.   She has osteoporosis per DEXA 08/2020 with L hip T -2.6, follows with [name]. She declined fosamax at this time, working on lifestyle.   BMI is Body mass index is 25.47 kg/m., she has been working on diet and exercise, walking regularly with her granddaughter. Wt Readings from Last 3 Encounters:  09/23/22 127 lb 3.2 oz (57.7 kg)  06/03/22 130 lb (59 kg)  03/11/22 132 lb (59.9 kg)   She has not been checking BP at home, today their BP is BP: 112/72 She does workout. She denies chest pain, shortness of breath, dizziness.   She is not on cholesterol medication and denies myalgias. Her cholesterol is at goal. The cholesterol last visit was:   Lab Results  Component Value Date   CHOL 199 03/11/2022   HDL 95 03/11/2022   LDLCALC 89 03/11/2022   TRIG 61 03/11/2022   CHOLHDL 2.1 03/11/2022    She has not been working on diet and exercise  for glucose management, and denies increased appetite, nausea, paresthesia of the feet, polydipsia, polyuria and visual disturbances. Last A1C in the office was:  Lab Results  Component Value Date   HGBA1C 5.5 03/11/2022   She is on thyroid medication, now following with [name], was switched to armour thyroid and per patient feeling much improved. Her medication was not changed last visit.  Doing 45 mg armour thyroid daily.  Lab Results  Component Value Date   TSH 1.31 03/11/2022   Last  GFR: Lab Results  Component Value Date   GFRNONAA 91 11/19/2020   Patient is on Vitamin D supplement, taking 5000 IU with K2.   Lab Results  Component Value Date   VD25OH 84 03/11/2022     She is on B12 supplement; just in her mutivitamin, 37.5 mg daily  Lab Results  Component Value Date   [id number] 09/03/2021    Medication Review: Current Outpatient Medications on File Prior to Visit  Medication Sig Dispense Refill   ALPRAZolam (XANAX) 0.5 MG tablet Take by mouth as needed.     ARMOUR THYROID 90 MG tablet TAKE 1/2 TABLET BY MOUTH DAILY 45 tablet 3   B Complex-C (B-COMPLEX WITH VITAMIN C) tablet Take 1 tablet by mouth daily.     benzonatate (TESSALON) 200 MG capsule Take 1 perle 3 x / day to prevent cough 30 capsule 1   dexamethasone (DECADRON) 4 MG tablet Take 1 tab 3 x /day for 2 days,      then 2 x /day for 2  Days,     then 1 tab daily 13 tablet 0   escitalopram (LEXAPRO) 5 MG/5ML solution TAKE 1-10ML BY MOUTH DAILY AS PER SLOW TAPER INSTRUCTIONS. REDUCE DOSE BY 1ML EVERY 2-4 WEEKS AS DIRECTED (Patient taking differently: Takes 2.4 mL daily) 240 mL 3   MAGNESIUM GLUCONATE PO Take 400 mg by mouth.     Multiple Vitamin (MULTIVITAMIN ADULT PO) Take by mouth.     Omega-3 Fatty Acids (FISH OIL OMEGA-3 PO) Take 1,200 mg by mouth.     promethazine-dextromethorphan (PROMETHAZINE-DM) 6.25-15 MG/5ML syrup Take 1 tsp every 4 hours if needed for cough 240 mL 1   VITAMIN D-VITAMIN K PO Take 1 tablet by mouth daily. Vit D 5000 Iu +K2 100 mcg      azithromycin (ZITHROMAX) 250 MG tablet Take 2 tablets with Food on  Day 1, then 1 tablet Daily with Food for Sinusitis / Bronchitis 6 each 1   No current facility-administered medications on file prior to visit.   Allergies  Allergen Reactions   Oxycodone     "Found Oxycodone not effective for pain control" with last RTKA   Other     Opiods,causes severe anxiety    Current Problems (verified) Patient Active Problem List   Diagnosis  Date Noted   CKD (chronic kidney disease) stage 2, GFR 60-89 ml/min 08/14/2020   Anxiety    Moderate episode of recurrent major depressive disorder ([place]) 03/27/2020   Other abnormal glucose 05/19/2015   Osteoporosis 05/19/2015   Hyperlipidemia    Hypertension    Hypothyroidism due to Hashimoto's thyroiditis    Vitamin D deficiency    OA (osteoarthritis) of knee 01/03/2012    Screening Tests Immunization History  Administered Date(s) Administered   Fluad Quad(high Dose 65+) 04/12/2019   Influenza Inj Mdck Quad Pf 05/23/2017   Influenza Split 05/10/2014, 05/19/2015   Influenza, High Dose Seasonal PF 07/02/2018, 08/14/2018, 06/09/2021   Influenza,inj,Quad PF,6+ Mos 03/23/2016  Influenza-Unspecified 04/12/2019, 07/07/2020   PFIZER(Purple Top)SARS-COV-2 Vaccination 09/13/2019, 10/08/2019, 07/07/2020, 02/25/2021   Pneumococcal Conjugate-13 07/09/2019   Pneumococcal Polysaccharide-23 03/14/2018   Td 06/02/2006   Tdap 03/14/2018   Health Maintenance  Topic Date Due   Zoster Vaccines- Shingrix (1 of 2) Never done   INFLUENZA VACCINE  03/02/2022   COVID-19 Vaccine (5 - 2023-24 season) 04/02/2022   DEXA SCAN  08/22/2022   MAMMOGRAM  09/15/2022   Medicare Annual Wellness (AWV)  03/12/2023   DTaP/Tdap/Td (3 - Td or Tdap) 03/14/2028   COLONOSCOPY (Pts 45-73yr Insurance coverage will need to be confirmed)  01/02/2029   Pneumonia Vaccine 70 Years old  Completed   Hepatitis C Screening  Completed   HPV VACCINES  Aged Out   Pap: 03/2018, never abnormal, HPV neg, DONE MGM: 09/21/22 Mammogram  DEXA: 08/2020, T-2.6 L fem, Dr. GCruzita Ledereris following, did recommend the fosamax that we prescribed for 2 years but patient declined - DEXA ordered - due  Colonoscopy: 01/2019 Dr. PHenrene Pastor 10 year follow up  Names of Other Physician/Practitioners you currently use: 1. [facility] here for primary care 2. Vision: Dr. DBing Plume last visit 06/2022, 3. Dental: Dr. EMarquette Saa last visit 2023 [id number] for a new dentist  4. Derm: Dr. JoAdah Salvageffice, new provider, last 2023 total body check - has scheduled visit 10/2022   Patient Care Team: McUnk PintoMD as PCP - General (Internal Medicine) AlGaynelle ArabianMD as Consulting Physician (Orthopedic Surgery) DiCalvert CantorMD as Consulting Physician (Ophthalmology) PeIrene ShipperMD as Consulting Physician (Gastroenterology) GhPhilemon KingdomMD as Consulting Physician (Internal Medicine) McGarnet SierrasNP as Nurse Practitioner (Adult Health Nurse Practitioner)  SURGICAL HISTORY She  has a past surgical history that includes c sections (jan 1980 and march 1984); ovarian cyst with lararoscopy march 1998 (1998); Total knee arthroplasty (01/03/2012); Wisdom tooth extraction (09-05-12); Total knee arthroplasty (Left, 09/11/2012); Cataract extraction, bilateral (Bilateral, 2016); and Colonoscopy. FAMILY HISTORY Her family history includes Arthritis in her mother; Breast cancer in her maternal grandmother and paternal grandmother; COPD in her father; Cancer in her mother; Hypertension in her mother; Stroke (age of onset: 9226in her maternal grandmother; Thyroid disease in her mother. SOCIAL HISTORY She  reports that she has never smoked. She has never used smokeless tobacco. She reports that she does not currently use alcohol. She reports that she does not use drugs.   Review of Systems  Constitutional:  Negative for malaise/fatigue and weight loss.  HENT:  Negative for hearing loss and tinnitus.   Eyes:  Negative for blurred vision and double vision.  Respiratory:  Negative for cough, sputum production, shortness of breath and wheezing.   Cardiovascular:  Negative for chest pain, palpitations, orthopnea, claudication, leg swelling and PND.  Gastrointestinal:  Negative for abdominal pain, blood in stool, constipation, diarrhea, heartburn, melena, nausea and vomiting.  Genitourinary: Negative.    Musculoskeletal:  Negative for falls, joint pain and myalgias.  Skin:  Negative for rash.  Neurological:  Negative for dizziness, tingling, sensory change, weakness and headaches.  Endo/Heme/Allergies:  Negative for polydipsia.  Psychiatric/Behavioral:  Positive for depression. Negative for memory loss, substance abuse and suicidal ideas. The patient is not nervous/anxious and does not have insomnia.   All other systems reviewed and are negative.    Objective:     Today's Vitals   09/23/22 1018  BP: 112/72  Pulse: 66  Temp: 98.1 F (36.7 C)  SpO2: 99%  Weight: 127 lb  3.2 oz (57.7 kg)  Height: 4' 11.25" (1.505 m)   Body mass index is 25.47 kg/m.  General appearance: alert, no distress, WD/WN, female HEENT: normocephalic, sclerae anicteric, TMs pearly, nares patent, no discharge or erythema, pharynx normal Oral cavity: MMM, no lesions Neck: supple, no lymphadenopathy, no thyromegaly, no masses Heart: RRR, normal S1, S2, no murmurs Lungs: CTA bilaterally, no wheezes, rhonchi, or rales Abdomen: +bs, soft, non tender, non distended, no masses, no hepatomegaly, no splenomegaly Musculoskeletal: nontender, no swelling, no obvious deformity Extremities: no edema, no cyanosis, no clubbing Pulses: 2+ symmetric, upper and lower extremities, normal cap refill Neurological: alert, oriented x 3, CN2-12 intact, strength normal upper extremities and lower extremities, sensation normal throughout, DTRs 2+ throughout, no cerebellar signs, gait normal Psychiatric: normal affect, behavior normal, pleasant  Breasts: declines, getting annual mammogram  GU: defer, no concerns  EKG: NSR   Galen Malkowski, NP   09/23/2022

## 2022-09-23 NOTE — Patient Instructions (Signed)

## 2022-09-24 LAB — COMPLETE METABOLIC PANEL WITH GFR
AG Ratio: 1.3 (calc) (ref 1.0–2.5)
ALT: 20 U/L (ref 6–29)
AST: 15 U/L (ref 10–35)
Albumin: 3.9 g/dL (ref 3.6–5.1)
Alkaline phosphatase (APISO): 68 U/L (ref 37–153)
BUN: 16 mg/dL (ref 7–25)
CO2: 29 mmol/L (ref 20–32)
Calcium: 9 mg/dL (ref 8.6–10.4)
Chloride: 102 mmol/L (ref 98–110)
Creat: 0.78 mg/dL (ref 0.50–1.05)
Globulin: 2.9 g/dL (calc) (ref 1.9–3.7)
Glucose, Bld: 94 mg/dL (ref 65–99)
Potassium: 4.1 mmol/L (ref 3.5–5.3)
Sodium: 141 mmol/L (ref 135–146)
Total Bilirubin: 0.5 mg/dL (ref 0.2–1.2)
Total Protein: 6.8 g/dL (ref 6.1–8.1)
eGFR: 82 mL/min/{1.73_m2} (ref >=60)

## 2022-09-24 LAB — LIPID PANEL
Cholesterol: 168 mg/dL (ref <200)
HDL: 77 mg/dL (ref >=50)
LDL Cholesterol (Calc): 76 mg/dL (calc)
Non-HDL Cholesterol (Calc): 91 mg/dL (calc) (ref <130)
Total CHOL/HDL Ratio: 2.2 (calc) (ref <5.0)
Triglycerides: 69 mg/dL (ref <150)

## 2022-09-24 LAB — CBC WITH DIFFERENTIAL/PLATELET
Absolute Monocytes: 974 cells/uL — ABNORMAL HIGH (ref 200–950)
Basophils Absolute: 32 cells/uL (ref 0–200)
Basophils Relative: 0.3 %
Eosinophils Absolute: 150 cells/uL (ref 15–500)
Eosinophils Relative: 1.4 %
HCT: 41.4 % (ref 35.0–45.0)
Hemoglobin: 13.9 g/dL (ref 11.7–15.5)
Lymphs Abs: 3231 cells/uL (ref 850–3900)
MCH: 30.3 pg (ref 27.0–33.0)
MCHC: 33.6 g/dL (ref 32.0–36.0)
MCV: 90.4 fL (ref 80.0–100.0)
MPV: 9.4 fL (ref 7.5–12.5)
Monocytes Relative: 9.1 %
Neutro Abs: 6313 cells/uL (ref 1500–7800)
Neutrophils Relative %: 59 %
Platelets: 448 10*3/uL — ABNORMAL HIGH (ref 140–400)
RBC: 4.58 10*6/uL (ref 3.80–5.10)
RDW: 12.2 % (ref 11.0–15.0)
Total Lymphocyte: 30.2 %
WBC: 10.7 10*3/uL (ref 3.8–10.8)

## 2022-09-24 LAB — HEMOGLOBIN A1C
Hgb A1c MFr Bld: 5.9 % of total Hgb — ABNORMAL HIGH (ref <5.7)
Mean Plasma Glucose: 123 mg/dL
eAG (mmol/L): 6.8 mmol/L

## 2022-09-24 LAB — URINALYSIS, ROUTINE W REFLEX MICROSCOPIC
Bilirubin Urine: NEGATIVE
Glucose, UA: NEGATIVE
Hgb urine dipstick: NEGATIVE
Ketones, ur: NEGATIVE
Leukocytes,Ua: NEGATIVE
Nitrite: NEGATIVE
Protein, ur: NEGATIVE
Specific Gravity, Urine: 1.012 (ref 1.001–1.035)
pH: 7 (ref 5.0–8.0)

## 2022-09-24 LAB — VITAMIN D 25 HYDROXY (VIT D DEFICIENCY, FRACTURES): Vit D, 25-Hydroxy: 93 ng/mL (ref 30–100)

## 2022-09-24 LAB — MICROALBUMIN / CREATININE URINE RATIO
Creatinine, Urine: 56 mg/dL (ref 20–275)
Microalb Creat Ratio: 4 mcg/mg creat (ref <30)
Microalb, Ur: 0.2 mg/dL

## 2022-09-24 LAB — MAGNESIUM: Magnesium: 2.3 mg/dL (ref 1.5–2.5)

## 2022-09-24 LAB — INSULIN, RANDOM: Insulin: 8.8 u[IU]/mL

## 2022-09-24 LAB — TSH: TSH: 1.22 mIU/L (ref 0.40–4.50)

## 2022-10-06 DIAGNOSIS — F332 Major depressive disorder, recurrent severe without psychotic features: Secondary | ICD-10-CM | POA: Diagnosis not present

## 2022-10-07 DIAGNOSIS — F332 Major depressive disorder, recurrent severe without psychotic features: Secondary | ICD-10-CM | POA: Diagnosis not present

## 2022-10-08 DIAGNOSIS — F332 Major depressive disorder, recurrent severe without psychotic features: Secondary | ICD-10-CM | POA: Diagnosis not present

## 2022-10-11 DIAGNOSIS — F332 Major depressive disorder, recurrent severe without psychotic features: Secondary | ICD-10-CM | POA: Diagnosis not present

## 2022-10-12 DIAGNOSIS — F332 Major depressive disorder, recurrent severe without psychotic features: Secondary | ICD-10-CM | POA: Diagnosis not present

## 2022-10-13 DIAGNOSIS — F332 Major depressive disorder, recurrent severe without psychotic features: Secondary | ICD-10-CM | POA: Diagnosis not present

## 2022-10-14 DIAGNOSIS — D1801 Hemangioma of skin and subcutaneous tissue: Secondary | ICD-10-CM | POA: Diagnosis not present

## 2022-10-14 DIAGNOSIS — D2272 Melanocytic nevi of left lower limb, including hip: Secondary | ICD-10-CM | POA: Diagnosis not present

## 2022-10-14 DIAGNOSIS — F332 Major depressive disorder, recurrent severe without psychotic features: Secondary | ICD-10-CM | POA: Diagnosis not present

## 2022-10-14 DIAGNOSIS — L821 Other seborrheic keratosis: Secondary | ICD-10-CM | POA: Diagnosis not present

## 2022-10-14 DIAGNOSIS — F411 Generalized anxiety disorder: Secondary | ICD-10-CM | POA: Diagnosis not present

## 2022-10-14 DIAGNOSIS — F330 Major depressive disorder, recurrent, mild: Secondary | ICD-10-CM | POA: Diagnosis not present

## 2022-10-15 DIAGNOSIS — F332 Major depressive disorder, recurrent severe without psychotic features: Secondary | ICD-10-CM | POA: Diagnosis not present

## 2022-10-18 DIAGNOSIS — F332 Major depressive disorder, recurrent severe without psychotic features: Secondary | ICD-10-CM | POA: Diagnosis not present

## 2022-10-19 DIAGNOSIS — F332 Major depressive disorder, recurrent severe without psychotic features: Secondary | ICD-10-CM | POA: Diagnosis not present

## 2022-10-20 DIAGNOSIS — F332 Major depressive disorder, recurrent severe without psychotic features: Secondary | ICD-10-CM | POA: Diagnosis not present

## 2022-10-21 DIAGNOSIS — F332 Major depressive disorder, recurrent severe without psychotic features: Secondary | ICD-10-CM | POA: Diagnosis not present

## 2022-10-22 DIAGNOSIS — F332 Major depressive disorder, recurrent severe without psychotic features: Secondary | ICD-10-CM | POA: Diagnosis not present

## 2022-10-25 DIAGNOSIS — F332 Major depressive disorder, recurrent severe without psychotic features: Secondary | ICD-10-CM | POA: Diagnosis not present

## 2022-10-26 DIAGNOSIS — F332 Major depressive disorder, recurrent severe without psychotic features: Secondary | ICD-10-CM | POA: Diagnosis not present

## 2022-10-27 DIAGNOSIS — F332 Major depressive disorder, recurrent severe without psychotic features: Secondary | ICD-10-CM | POA: Diagnosis not present

## 2022-10-28 DIAGNOSIS — F332 Major depressive disorder, recurrent severe without psychotic features: Secondary | ICD-10-CM | POA: Diagnosis not present

## 2022-10-29 DIAGNOSIS — F332 Major depressive disorder, recurrent severe without psychotic features: Secondary | ICD-10-CM | POA: Diagnosis not present

## 2022-11-01 DIAGNOSIS — F332 Major depressive disorder, recurrent severe without psychotic features: Secondary | ICD-10-CM | POA: Diagnosis not present

## 2022-11-02 DIAGNOSIS — F332 Major depressive disorder, recurrent severe without psychotic features: Secondary | ICD-10-CM | POA: Diagnosis not present

## 2022-11-03 DIAGNOSIS — F332 Major depressive disorder, recurrent severe without psychotic features: Secondary | ICD-10-CM | POA: Diagnosis not present

## 2022-11-04 DIAGNOSIS — F332 Major depressive disorder, recurrent severe without psychotic features: Secondary | ICD-10-CM | POA: Diagnosis not present

## 2022-11-05 DIAGNOSIS — F332 Major depressive disorder, recurrent severe without psychotic features: Secondary | ICD-10-CM | POA: Diagnosis not present

## 2022-11-08 DIAGNOSIS — F332 Major depressive disorder, recurrent severe without psychotic features: Secondary | ICD-10-CM | POA: Diagnosis not present

## 2022-11-09 DIAGNOSIS — F332 Major depressive disorder, recurrent severe without psychotic features: Secondary | ICD-10-CM | POA: Diagnosis not present

## 2022-11-10 DIAGNOSIS — F332 Major depressive disorder, recurrent severe without psychotic features: Secondary | ICD-10-CM | POA: Diagnosis not present

## 2022-11-11 DIAGNOSIS — F332 Major depressive disorder, recurrent severe without psychotic features: Secondary | ICD-10-CM | POA: Diagnosis not present

## 2022-11-12 DIAGNOSIS — F332 Major depressive disorder, recurrent severe without psychotic features: Secondary | ICD-10-CM | POA: Diagnosis not present

## 2022-11-15 DIAGNOSIS — F332 Major depressive disorder, recurrent severe without psychotic features: Secondary | ICD-10-CM | POA: Diagnosis not present

## 2022-11-16 DIAGNOSIS — F332 Major depressive disorder, recurrent severe without psychotic features: Secondary | ICD-10-CM | POA: Diagnosis not present

## 2022-11-18 DIAGNOSIS — F332 Major depressive disorder, recurrent severe without psychotic features: Secondary | ICD-10-CM | POA: Diagnosis not present

## 2022-11-22 DIAGNOSIS — F332 Major depressive disorder, recurrent severe without psychotic features: Secondary | ICD-10-CM | POA: Diagnosis not present

## 2022-11-23 ENCOUNTER — Ambulatory Visit: Payer: Medicare Other | Admitting: Nurse Practitioner

## 2022-11-24 DIAGNOSIS — F332 Major depressive disorder, recurrent severe without psychotic features: Secondary | ICD-10-CM | POA: Diagnosis not present

## 2022-11-25 DIAGNOSIS — F330 Major depressive disorder, recurrent, mild: Secondary | ICD-10-CM | POA: Diagnosis not present

## 2022-11-25 DIAGNOSIS — F411 Generalized anxiety disorder: Secondary | ICD-10-CM | POA: Diagnosis not present

## 2022-11-26 DIAGNOSIS — F332 Major depressive disorder, recurrent severe without psychotic features: Secondary | ICD-10-CM | POA: Diagnosis not present

## 2022-12-01 DIAGNOSIS — F332 Major depressive disorder, recurrent severe without psychotic features: Secondary | ICD-10-CM | POA: Diagnosis not present

## 2022-12-02 ENCOUNTER — Ambulatory Visit: Payer: Medicare Other | Admitting: Internal Medicine

## 2022-12-03 DIAGNOSIS — F332 Major depressive disorder, recurrent severe without psychotic features: Secondary | ICD-10-CM | POA: Diagnosis not present

## 2022-12-23 ENCOUNTER — Ambulatory Visit: Payer: Medicare Other | Admitting: Nurse Practitioner

## 2023-01-05 DIAGNOSIS — F332 Major depressive disorder, recurrent severe without psychotic features: Secondary | ICD-10-CM | POA: Diagnosis not present

## 2023-01-06 DIAGNOSIS — F411 Generalized anxiety disorder: Secondary | ICD-10-CM | POA: Diagnosis not present

## 2023-01-06 DIAGNOSIS — F330 Major depressive disorder, recurrent, mild: Secondary | ICD-10-CM | POA: Diagnosis not present

## 2023-01-13 ENCOUNTER — Encounter: Payer: Self-pay | Admitting: Podiatrist

## 2023-01-13 ENCOUNTER — Ambulatory Visit (INDEPENDENT_AMBULATORY_CARE_PROVIDER_SITE_OTHER): Payer: Medicare Other | Admitting: Podiatrist

## 2023-01-13 DIAGNOSIS — L84 Corns and callosities: Secondary | ICD-10-CM

## 2023-01-13 NOTE — Progress Notes (Signed)
  Chief Complaint  Patient presents with   Callouses    Bilateral corns/callus on bottom of feet      HPI: Patient is 72 y.o. female who presents today for painful calluses on bilateral feet.  She has well-circumscribed calluses submetatarsal 1 bilateral and sub-meta tarsal 3 of the right foot.  She relates they are tender and symptomatic in shoes.  She did go for a pedicure but they were unable to trim down the lesions for her there.   Allergies  Allergen Reactions   Oxycodone     "Found Oxycodone not effective for pain control" with last RTKA   Other     Opiods,causes severe anxiety    Review of systems is negative except as noted in the HPI.  Denies nausea/ vomiting/ fevers/ chills or night sweats.   Denies difficulty breathing, denies calf pain or tenderness  Physical Exam  Patient is awake, alert, and oriented x 3.  In no acute distress.    Vascular status is intact with palpable pedal pulses DP and PT bilateral and capillary refill time less than 3 seconds bilateral.  No edema or erythema noted.   Neurological exam reveals epicritic and protective sensation grossly intact bilateral.   Dermatological exam reveals skin is supple and dry to bilateral feet.  Well-circumscribed lesions present submetatarsal 1 bilateral and submetatarsal 3 right.  They are hyperkeratotic and nature.  No central core is noted.  Musculoskeletal exam: Musculature intact with dorsiflexion, plantarflexion, inversion, eversion. Ankle and First MPJ joint range of motion normal.     Assessment:   ICD-10-CM   1. Callus of foot  L84        Plan: Discussed exam findings and treatment recommendations with the patient.  At today's visit I recommended debridement of the lesions.  This was accomplished today with a #15 blade without complication.  She will be seen back in the future as needed and will call with any problems or concerns arise.

## 2023-01-18 ENCOUNTER — Ambulatory Visit (INDEPENDENT_AMBULATORY_CARE_PROVIDER_SITE_OTHER): Payer: Medicare Other | Admitting: Nurse Practitioner

## 2023-01-18 ENCOUNTER — Encounter: Payer: Self-pay | Admitting: Nurse Practitioner

## 2023-01-18 VITALS — BP 122/66 | HR 66 | Temp 98.0°F | Ht 59.0 in | Wt 127.8 lb

## 2023-01-18 DIAGNOSIS — E785 Hyperlipidemia, unspecified: Secondary | ICD-10-CM | POA: Diagnosis not present

## 2023-01-18 DIAGNOSIS — E063 Autoimmune thyroiditis: Secondary | ICD-10-CM

## 2023-01-18 DIAGNOSIS — F419 Anxiety disorder, unspecified: Secondary | ICD-10-CM | POA: Diagnosis not present

## 2023-01-18 DIAGNOSIS — Z79899 Other long term (current) drug therapy: Secondary | ICD-10-CM | POA: Diagnosis not present

## 2023-01-18 DIAGNOSIS — M810 Age-related osteoporosis without current pathological fracture: Secondary | ICD-10-CM

## 2023-01-18 DIAGNOSIS — I10 Essential (primary) hypertension: Secondary | ICD-10-CM

## 2023-01-18 DIAGNOSIS — E038 Other specified hypothyroidism: Secondary | ICD-10-CM | POA: Diagnosis not present

## 2023-01-18 DIAGNOSIS — Z0000 Encounter for general adult medical examination without abnormal findings: Secondary | ICD-10-CM

## 2023-01-18 DIAGNOSIS — Z0001 Encounter for general adult medical examination with abnormal findings: Secondary | ICD-10-CM

## 2023-01-18 DIAGNOSIS — R6889 Other general symptoms and signs: Secondary | ICD-10-CM | POA: Diagnosis not present

## 2023-01-18 DIAGNOSIS — F331 Major depressive disorder, recurrent, moderate: Secondary | ICD-10-CM

## 2023-01-18 DIAGNOSIS — E559 Vitamin D deficiency, unspecified: Secondary | ICD-10-CM

## 2023-01-18 DIAGNOSIS — Z6826 Body mass index (BMI) 26.0-26.9, adult: Secondary | ICD-10-CM

## 2023-01-18 DIAGNOSIS — R7309 Other abnormal glucose: Secondary | ICD-10-CM

## 2023-01-18 NOTE — Progress Notes (Signed)
AWV and 3 MONTH FOLLOW UP  Assessment:   Annual Wellness Due annually  Health maintenance reviewed Healthy lifestyle goals set  Primary hypertension Controlled by lifestyle  Discussed DASH (Dietary Approaches to Stop Hypertension) DASH diet is lower in sodium than a typical American diet. Cut back on foods that are high in saturated fat, cholesterol, and trans fats. Eat more whole-grain foods, fish, poultry, and nuts Remain active and exercise as tolerated daily.  Monitor BP at home-Call if greater than 130/80.  Check CMP/CBC  Other specified hypothyroidism Managed by Dr. Elvera Lennox, now on armour thyroid and much improved.  Controlled. Reminded to take medication on an empty stomach 30-62mins before food.  Stop any Biotin Supplement 48-72 hours before next TSH level to reduce the risk of falsely low TSH levels. Continue to monitor.    Hyperlipidemia Discussed lifestyle modifications. Recommended diet heavy in fruits and veggies, omega 3's. Decrease consumption of animal meats, cheeses, and dairy products. Remain active and exercise as tolerated. Continue to monitor. Check lipids/TSH  Depression, major, active (HCC) - with anxiety and grief reaction Lexapro liquid with slow taper Following with psych Dr. Betti Cruz and counseling due to med intolerance Lifestyle discussed: diet/exerise, sleep hygiene, stress management, hydration  Other abnormal glucose Education: Reviewed 'ABCs' of diabetes management  Discussed goals to be met and/or maintained include A1C (<7) Blood pressure (<130/80) Cholesterol (LDL <70) Continue Eye Exam yearly  Continue Dental Exam Q6 mo Discussed dietary recommendations Discussed Physical Activity recommendations Check A1C  Vitamin D deficiency Continue supplement for goal of 60-100 Monitor Vitamin D levels  Osteoarthritis of both knees, unspecified osteoarthritis type Continue weight loss, better after surgery  Osteoporosis Continue with  newly prescribed fosamax Repeat DEXA 2 years - 08/2022 Dr. Elvera Lennox following as well  continue Vit D and Ca, weight bearing exercises  Medication management All medications discussed and reviewed in full. All questions and concerns regarding medications addressed.    BMI 25 Discussed appropriate BMI Goal of losing 1 lb per month. Diet modification. Physical activity. Encouraged/praised to build confidence.   Future Appointments  Date Time Provider Department Center  03/31/2023  1:00 PM Carlus Pavlov, MD [facility] None  06/14/2023  2:30 PM [facility] DX DEXA 1 [facility]  09/27/2023 10:00 AM Adela Glimpse, NP [facility] None  01/18/2024  3:30 PM Nasim Habeeb, Archie Patten, NP [facility] None     Plan:   During the course of the visit the patient was educated and counseled about appropriate screening and preventive services including:   Pneumococcal vaccine  Prevnar 13 Influenza vaccine Td vaccine Screening electrocardiogram Bone densitometry screening Colorectal cancer screening Diabetes screening Glaucoma screening Nutrition counseling  Advanced directives: requested   Subjective:  Michelle Miranda is a 72 y.o. female who presents for AWV. She has OA (osteoarthritis) of knee; Hyperlipidemia; Hypertension; Hypothyroidism due to Hashimoto's thyroiditis; Vitamin D deficiency; Other abnormal glucose; Osteoporosis; Moderate episode of recurrent major depressive disorder (HCC); Anxiety; and CKD (chronic kidney disease) stage 2, GFR 60-89 ml/min on their problem list.   First grandchild (girl) arrived in 2019, doing well, watches her during the day a few days a week. She is retired International aid/development worker.  Complaints of recurrent nose bleeds.  Has logged x9 episodes since 07/2022, always in the left nostril, not painful, not preceeded by HA. Two of them very sporadic, no truama, no allergies.  She has not seen ENT.  Last 10-15 min.  Able to be controlled. Uses flonase  PRN  The patient notably lost  her husband to cancer abruptly early 2021, has had recurrent depression/anxiety, grief reaction, severe anxiety and panic attacks on cymbalta 60 mg which she had been on for many years for depression. Transitioned to lexapro but was evaluated 05/21/2020 in ED for AMS, transient confusion/disorientation, unremarkable lab/imaging. There was concern for possible med reaction due to newly on lexapro/SS, and was advised taper to stop. Note she was fully off of cymbalta for 4 weeks, on lexapro only at time of AMS event. She is also prescribed xanax which she is trying to reduce/avoid daily dosing. She is continuing Buspar  BMI is Body mass index is 25.81 kg/m., she has been working on diet and exercise, walking 2 days a week with granddaughter in stroller, 30 min. Admits poor appetite but making sure she eats, avoiding empty calories, no desserts.  Wt Readings from Last 3 Encounters:  01/18/23 127 lb 12.8 oz (58 kg)  09/23/22 127 lb 3.2 oz (57.7 kg)  06/03/22 130 lb (59 kg)   She has not been checking BP at home, today their BP is BP: 122/66 She does workout. She denies chest pain, shortness of breath, dizziness.   She is not on cholesterol medication and denies myalgias. Her cholesterol is at goal. The cholesterol last visit was:   Lab Results  Component Value Date   CHOL 168 09/23/2022   HDL 77 09/23/2022   LDLCALC 76 09/23/2022   TRIG 69 09/23/2022   CHOLHDL 2.2 09/23/2022    She has not been working on diet and exercise for glucose management, and denies increased appetite, nausea, paresthesia of the feet, polydipsia, polyuria and visual disturbances. Last A1C in the office was:  Lab Results  Component Value Date   HGBA1C 5.9 (H) 09/23/2022   She is on thyroid medication, now following with Dr. Elvera Lennox, was switched to armour thyroid and per patient feeling much improved. Her medication was not changed last visit.   Lab Results  Component Value Date   TSH  1.22 09/23/2022   Last GFR: Lab Results  Component Value Date   GFRNONAA 91 11/19/2020   Patient is on Vitamin D supplement.   Lab Results  Component Value Date   VD25OH 41 09/23/2022     She is on B12 supplement;  Lab Results  Component Value Date   VITAMINB12 765 09/03/2021   Orders Placed This Encounter  Procedures   CBC with Differential/Platelet   COMPLETE METABOLIC PANEL WITH GFR   Hemoglobin A1c   TSH    Notify office for further evaluation and treatment, questions or concerns if any reported s/s fail to improve.   The patient was advised to call back or seek an in-person evaluation if any symptoms worsen or if the condition fails to improve as anticipated.   Further disposition pending results of labs. Discussed med's effects and SE's.    I discussed the assessment and treatment plan with the patient. The patient was provided an opportunity to ask questions and all were answered. The patient agreed with the plan and demonstrated an understanding of the instructions.  Discussed med's effects and SE's. Screening labs and tests as requested with regular follow-up as recommended.  I provided 40 minutes of face-to-face time during this encounter including counseling, chart review, and critical decision making was preformed.  Today's Plan of Care is based on a patient-centered health care approach known as shared decision making - the decisions, tests and treatments allow for patient preferences and values to be balanced with clinical evidence.  Medication Review: Current Outpatient Medications on File Prior to Visit  Medication Sig Dispense Refill   ALPRAZolam (XANAX) 0.5 MG tablet Take by mouth as needed.     ARMOUR THYROID 90 MG tablet TAKE 1/2 TABLET BY MOUTH DAILY 45 tablet 3   B Complex-C (B-COMPLEX WITH VITAMIN C) tablet Take 1 tablet by mouth daily.     escitalopram (LEXAPRO) 5 MG/5ML solution TAKE 1-10ML BY MOUTH DAILY AS PER SLOW TAPER INSTRUCTIONS. REDUCE  DOSE BY EVERY 2-4 WEEKS AS DIRECTED (Patient taking differently: Takes 2.4 mL daily) 240 mL 3   MAGNESIUM GLUCONATE PO Take 400 mg by mouth.     Multiple Vitamin (MULTIVITAMIN ADULT PO) Take by mouth.     Omega-3 Fatty Acids (FISH OIL OMEGA-3 PO) Take 1,200 mg by mouth.     VITAMIN D-VITAMIN K PO Take 1 tablet by mouth daily. Vit D 5000 Iu +K2 100 mcg      azithromycin (ZITHROMAX) 250 MG tablet Take 2 tablets with Food on  Day 1, then 1 tablet Daily with Food for Sinusitis / Bronchitis 6 each 1   benzonatate (TESSALON) 200 MG capsule Take 1 perle 3 x / day to prevent cough 30 capsule 1   dexamethasone (DECADRON) 4 MG tablet Take 1 tab 3 x /day for 2 days,      then 2 x /day for 2  Days,     then 1 tab daily 13 tablet 0   promethazine-dextromethorphan (PROMETHAZINE-DM) 6.25-15 MG/5ML syrup Take 1 tsp every 4 hours if needed for cough 240 mL 1   No current facility-administered medications on file prior to visit.    Allergies  Allergen Reactions   Oxycodone     "Found Oxycodone not effective for pain control" with last RTKA   Dexamethasone Other (See Comments)    "Hallucinations, Psychosis"    Other     Opiods,causes severe anxiety    Current Problems (verified) Patient Active Problem List   Diagnosis Date Noted   CKD (chronic kidney disease) stage 2, GFR 60-89 ml/min 08/14/2020   Anxiety    Moderate episode of recurrent major depressive disorder (HCC) 03/27/2020   Other abnormal glucose 05/19/2015   Osteoporosis 05/19/2015   Hyperlipidemia    Hypertension    Hypothyroidism due to Hashimoto's thyroiditis    Vitamin D deficiency    OA (osteoarthritis) of knee 01/03/2012    Screening Tests Immunization History  Administered Date(s) Administered   Fluad Quad(high Dose 65+) 04/12/2019   Influenza Inj Mdck Quad Pf 05/23/2017   Influenza Split 05/10/2014, 05/19/2015   Influenza, High Dose Seasonal PF 07/02/2018, 08/14/2018, 06/09/2021   Influenza,inj,Quad PF,6+ Mos  03/23/2016   Influenza-Unspecified 04/12/2019, 07/07/2020   PFIZER(Purple Top)SARS-COV-2 Vaccination 09/13/2019, 10/08/2019, 07/07/2020, 02/25/2021   Pneumococcal Conjugate-13 07/09/2019   Pneumococcal Polysaccharide-23 03/14/2018   Td 06/02/2006   Tdap 03/14/2018   Tetanus: 2007, 2019 Pneumovax: 03/2018  Prevnar 13: 07/2019 Flu vaccine: Due 05/2022 Zostavax: declines Covid 19: 3/3, 2021, pfizer  Pap: 03/2018, never abnormal, HPV neg, DONE MGM: 09/2022 DEXA: 08/2020, T-2.6 L fem, Dr. Elvera Lennox is following, did recommend the fosamax that we prescribed for 2 years  Colonoscopy: 01/2019 Dr. Marina Goodell, 10 year follow up EGD: N/A CTA 09/2012  Names of Other Physician/Practitioners you currently use: 1. [facility] here for primary care 2. Vision: Dr. Hazle Quant, last visit 05/2022 3. Dental: 12/2022 q 3mo 4. Derm: Dr. Barnett Applebaum office, new provider, last 2022 total body check - Levonne Spiller  [facility]   Patient Care Team: Lucky Cowboy, [name] Ollen Gross, MD as Consulting Physician (Orthopedic Surgery) Nelson Chimes, MD as Consulting Physician (Ophthalmology) Hilarie Fredrickson, MD as Consulting Physician (Gastroenterology) Carlus Pavlov, MD as Consulting Physician (Internal Medicine) Elder Negus, NP as Nurse Practitioner (Adult Health Nurse Practitioner)  SURGICAL HISTORY She  has a past surgical history that includes c sections (jan 1980 and march 1984); ovarian cyst with lararoscopy march 1998 (1998); Total knee arthroplasty (01/03/2012); Wisdom tooth extraction (09-05-12); Total knee arthroplasty (Left, 09/11/2012); Cataract extraction, bilateral (Bilateral, 2016); and Colonoscopy. FAMILY HISTORY Her family history includes Arthritis in her mother; Breast cancer in her maternal grandmother and paternal grandmother; COPD in her father; Cancer in her mother; Hypertension in her mother; Stroke (age of onset: 61) in  her maternal grandmother; Thyroid disease in her mother. SOCIAL HISTORY She  reports that she has never smoked. She has never used smokeless tobacco. She reports that she does not currently use alcohol. She reports that she does not use drugs.   MEDICARE WELLNESS OBJECTIVES: Physical activity: Current Exercise Habits: Home exercise routine Cardiac risk factors:   Depression/mood screen:      01/18/2023    3:19 PM  Depression screen PHQ 2/9  Decreased Interest 0  Down, Depressed, Hopeless 1  PHQ - 2 Score 1    ADLs:     01/18/2023    3:19 PM 03/15/2022    7:24 AM  In your present state of health, do you have any difficulty performing the following activities:  Hearing? 0 0  Vision? 0 0  Difficulty concentrating or making decisions? 0 0  Walking or climbing stairs? 0 0  Dressing or bathing? 0 0  Doing errands, shopping? 0 0  Preparing Food and eating ?  N  Using the Toilet?  N  In the past six months, have you accidently leaked urine?  N  Do you have problems with loss of bowel control?  N  Managing your Medications?  N  Managing your Finances?  N  Housekeeping or managing your Housekeeping?  N     Cognitive Testing  Alert? Yes  Normal Appearance?Yes  Oriented to person? Yes  Place? Yes   Time? Yes  Recall of three objects?  Yes  Can perform simple calculations? Yes  Displays appropriate judgment?Yes  Can read the correct time from a watch face?Yes  EOL planning: Does Patient Have a Medical Advance Directive?: Yes Type of Advance Directive: Living will    Review of Systems  Constitutional:  Negative for malaise/fatigue and weight loss.  HENT:  Negative for hearing loss and tinnitus.   Eyes:  Negative for blurred vision and double vision.  Respiratory:  Negative for cough, sputum production, shortness of breath and wheezing.   Cardiovascular:  Negative for chest pain, palpitations, orthopnea, claudication, leg swelling and PND.  Gastrointestinal:  Negative for  abdominal pain, blood in stool, constipation, diarrhea, heartburn, melena, nausea and vomiting.  Genitourinary: Negative.   Musculoskeletal:  Negative for falls, joint pain and myalgias.  Skin:  Negative for rash.  Neurological:  Negative for dizziness, tingling, sensory change, weakness and headaches.  Endo/Heme/Allergies:  Negative for polydipsia.  Psychiatric/Behavioral:  Positive for depression. Negative for memory loss, substance abuse and suicidal ideas. The patient is not nervous/anxious and does not have insomnia.   All other systems reviewed and are negative.    Objective:     Today's Vitals   01/18/23 1503  BP:  122/66  Pulse: 66  Temp: 98 F (36.7 C)  SpO2: 99%  Weight: 127 lb 12.8 oz (58 kg)  Height: 4\' 11"  (1.499 m)   Body mass index is 25.81 kg/m.  General appearance: alert, no distress, WD/WN, female HEENT: normocephalic, sclerae anicteric, TMs pearly, nares patent, no discharge or erythema, pharynx normal Oral cavity: MMM, no lesions Neck: supple, no lymphadenopathy, no thyromegaly, no masses Heart: RRR, normal S1, S2, no murmurs Lungs: CTA bilaterally, no wheezes, rhonchi, or rales Abdomen: +bs, soft, non tender, non distended, no masses, no hepatomegaly, no splenomegaly Musculoskeletal: nontender, no swelling, no obvious deformity Extremities: no edema, no cyanosis, no clubbing Pulses: 2+ symmetric, upper and lower extremities, normal cap refill Neurological: alert, oriented x 3, CN2-12 intact, strength normal upper extremities and lower extremities, sensation normal throughout, DTRs 2+ throughout, no cerebellar signs, gait normal Psychiatric: normal affect, behavior normal, pleasant   Medicare Attestation I have personally reviewed: The patient's medical and social history Their use of alcohol, tobacco or illicit drugs Their current medications and supplements The patient's functional ability including ADLs,fall risks, home safety risks, cognitive, and  hearing and visual impairment Diet and physical activities Evidence for depression or mood disorders  The patient's weight, height, BMI, and visual acuity have been recorded in the chart.  I have made referrals, counseling, and provided education to the patient based on review of the above and I have provided the patient with a written personalized care plan for preventive services.     Adela Glimpse, NP   01/18/2023

## 2023-01-18 NOTE — Patient Instructions (Signed)

## 2023-01-19 ENCOUNTER — Encounter: Payer: Self-pay | Admitting: Nurse Practitioner

## 2023-01-19 LAB — TSH: TSH: 0.86 mIU/L (ref 0.40–4.50)

## 2023-01-19 LAB — CBC WITH DIFFERENTIAL/PLATELET
Absolute Monocytes: 765 cells/uL (ref 200–950)
Basophils Absolute: 68 cells/uL (ref 0–200)
Basophils Relative: 0.8 %
Eosinophils Absolute: 94 cells/uL (ref 15–500)
Eosinophils Relative: 1.1 %
HCT: 41.6 % (ref 35.0–45.0)
Hemoglobin: 14 g/dL (ref 11.7–15.5)
Lymphs Abs: 2142 cells/uL (ref 850–3900)
MCH: 30.2 pg (ref 27.0–33.0)
MCHC: 33.7 g/dL (ref 32.0–36.0)
MCV: 89.7 fL (ref 80.0–100.0)
MPV: 9.6 fL (ref 7.5–12.5)
Monocytes Relative: 9 %
Neutro Abs: 5432 cells/uL (ref 1500–7800)
Neutrophils Relative %: 63.9 %
Platelets: 289 10*3/uL (ref 140–400)
RBC: 4.64 10*6/uL (ref 3.80–5.10)
RDW: 12.5 % (ref 11.0–15.0)
Total Lymphocyte: 25.2 %
WBC: 8.5 10*3/uL (ref 3.8–10.8)

## 2023-01-19 LAB — COMPLETE METABOLIC PANEL WITH GFR
AG Ratio: 1.7 (calc) (ref 1.0–2.5)
ALT: 22 U/L (ref 6–29)
AST: 24 U/L (ref 10–35)
Albumin: 4.5 g/dL (ref 3.6–5.1)
Alkaline phosphatase (APISO): 77 U/L (ref 37–153)
BUN: 14 mg/dL (ref 7–25)
CO2: 30 mmol/L (ref 20–32)
Calcium: 9.6 mg/dL (ref 8.6–10.4)
Chloride: 103 mmol/L (ref 98–110)
Creat: 0.77 mg/dL (ref 0.60–1.00)
Globulin: 2.7 g/dL (calc) (ref 1.9–3.7)
Glucose, Bld: 82 mg/dL (ref 65–99)
Potassium: 4.4 mmol/L (ref 3.5–5.3)
Sodium: 140 mmol/L (ref 135–146)
Total Bilirubin: 0.7 mg/dL (ref 0.2–1.2)
Total Protein: 7.2 g/dL (ref 6.1–8.1)
eGFR: 83 mL/min/{1.73_m2} (ref >=60)

## 2023-01-19 LAB — HEMOGLOBIN A1C
Hgb A1c MFr Bld: 5.8 % of total Hgb — ABNORMAL HIGH (ref <5.7)
Mean Plasma Glucose: 120 mg/dL
eAG (mmol/L): 6.6 mmol/L

## 2023-02-17 DIAGNOSIS — F330 Major depressive disorder, recurrent, mild: Secondary | ICD-10-CM | POA: Diagnosis not present

## 2023-02-17 DIAGNOSIS — F411 Generalized anxiety disorder: Secondary | ICD-10-CM | POA: Diagnosis not present

## 2023-03-31 ENCOUNTER — Encounter: Payer: Self-pay | Admitting: Internal Medicine

## 2023-03-31 ENCOUNTER — Ambulatory Visit (INDEPENDENT_AMBULATORY_CARE_PROVIDER_SITE_OTHER): Payer: Medicare Other | Admitting: Internal Medicine

## 2023-03-31 VITALS — BP 130/70 | HR 83 | Ht 59.0 in | Wt 126.4 lb

## 2023-03-31 DIAGNOSIS — E038 Other specified hypothyroidism: Secondary | ICD-10-CM | POA: Diagnosis not present

## 2023-03-31 DIAGNOSIS — M810 Age-related osteoporosis without current pathological fracture: Secondary | ICD-10-CM | POA: Diagnosis not present

## 2023-03-31 DIAGNOSIS — F330 Major depressive disorder, recurrent, mild: Secondary | ICD-10-CM | POA: Diagnosis not present

## 2023-03-31 DIAGNOSIS — F411 Generalized anxiety disorder: Secondary | ICD-10-CM | POA: Diagnosis not present

## 2023-03-31 DIAGNOSIS — E063 Autoimmune thyroiditis: Secondary | ICD-10-CM | POA: Diagnosis not present

## 2023-03-31 MED ORDER — ARMOUR THYROID 90 MG PO TABS: 45.0000 mg | ORAL_TABLET | Freq: Every day | ORAL | 3 refills | Status: DC

## 2023-03-31 NOTE — Patient Instructions (Addendum)
Please continue Armour 45 mg daily.  Take the thyroid hormone every day, with water, at least 30 minutes before breakfast, separated by at least 4 hours from: - acid reflux medications - calcium - iron - multivitamins  Please have the results of the bone density scan sent to me.  Try to start weightbearing exercises - dumbbells, elastic bands.  Please return in 1 year.

## 2023-03-31 NOTE — Progress Notes (Signed)
Patient ID: Michelle Miranda, female   DOB: 1950/11/05, 72 y.o.   MRN: [id number]   HPI  Michelle Miranda is a 72 y.o.-year-old female, initially referred by her PCP, Quentin Mulling, NP (Dr. Oneta Rack), returning for follow-up for Hashimoto's hypothyroidism and osteoporosis. Last visit 6 months ago.  Interim history: No falls or fractures since last visit. No dizziness/vertigo/orthostasis. She has anxiety, also depression, brain fog. She was changed from Cymbalta to Lexapro due to breakthrough symptoms.  However, she did not notice significant improvement. She has hair loss and increased nail breakage.  Hashimoto's hypothyroidism: Reviewed history: Pt. has been dx with hypothyroidism in 2012 and Hashimoto's thyroiditis in 04/2016 >> prev. On Levothyroxine, then on Synthroid d.a.w. 37.5 mcg 6/7 and 75 mcg 1/7 (changed in 01/2016).  Latest thyroid dose change was to 50 mcg daily and she has maintained normal TFTs afterwards.    We changed from levothyroxine to Armour 01/2019.  She feels much better on Armour, with less fatigue.  She takes Armour 45 mg daily: - in am - fasting - at least 30 min from b'fast - no Ca, Fe,  PPIs, + MVI more than 4 hours later  She was previously on selenium 200 mcg daily, but now off.  Reviewed her TFTs: Lab Results  Component Value Date   TSH 0.86 01/18/2023   TSH 1.22 09/23/2022   TSH 1.31 03/11/2022   TSH 1.11 09/03/2021   TSH 1.59 03/26/2021   TSH 1.86 10/27/2020   TSH 1.59 08/13/2020   TSH 1.40 05/07/2020   TSH 2.06 02/11/2020   TSH 1.71 10/17/2019   FREET4 0.82 10/27/2020   FREET4 1.1 08/13/2020   FREET4 0.77 05/07/2020   FREET4 0.70 08/23/2019   FREET4 0.61 06/13/2019   FREET4 0.76 02/26/2019   FREET4 0.90 02/10/2018   FREET4 0.74 02/10/2017   FREET4 0.70 05/18/2016   FREET4 0.79 04/09/2016    She has a history of Hashimoto thyroiditis based on elevated TPO antibodies: Component     Latest Ref Rng & Units 04/09/2016  Thyroperoxidase Ab  SerPl-aCnc     <9 IU/mL 9 (H)  Thyroglobulin Ab     <2 IU/mL <1   Pt denies: - feeling nodules in neck - hoarseness - dysphagia - choking  She has + FH of thyroid disorders in: mother - hypothyroid. No FH of thyroid cancer. No h/o radiation tx to head or neck. No recent steroids use.   Osteoporosis:  Pt was dx with OP in 08/2020.  I reviewed pt's DXA scans: Date Spine FN T score 33% distal Radius  [facility]22/[facility] (L1, L2) L3-L4: -1.3 (-7.1%*) RFN: -2.2 LFN: -2.6 n/a  07/21/2018 ([facility]) n/a RFN: -2.0 LFN: -2.4 -1.4  07/18/2015 ([facility]) L1-L4 (L3): -1.6 RFN: -1.8 LFN: -2.2 n/a   She does have height loss.  Previous OP treatments:  - Evista  ~2005 >>  08/2020 - Fosamax - suggested 09/2020, but not started  She does [employer] (at [employer]) and using a vibration platform to help with osteoporosis.  No h/o vitamin D deficiency. Reviewed available vit D levels: Lab Results  Component Value Date   VD25OH 93 09/23/2022   VD25OH 75 03/11/2022   VD25OH 73 09/03/2021   VD25OH 70 03/26/2021   VD25OH 79 08/13/2020   VD25OH 68 02/11/2020   VD25OH 48 07/09/2019   VD25OH 71 03/14/2018   VD25OH 57 07/06/2016   VD25OH 60 02/23/2016   Pt is on: - vitamin D  5000 units + vitamin K2  daily   She takes 1500 mg calcium from food.  She drinks Oatmilk.  No weight bearing exercises. Used to do Pilates >> stopped >> then once a week.  She is lifting her grandchildren.  She does not take high vitamin A doses.  Menopause was at 72 y/o.   + FH of osteoporosis: In mother.  No h/o persistent hyper/hypocalcemia or hyperparathyroidism. No h/o kidney stones. Lab Results  Component Value Date   CALCIUM 9.6 01/18/2023   CALCIUM 9.0 09/23/2022   CALCIUM 9.3 03/11/2022   CALCIUM 9.2 09/03/2021   CALCIUM 9.1 03/26/2021   CALCIUM 9.3 11/19/2020   CALCIUM 9.2 08/13/2020   CALCIUM 8.6 (L) 05/21/2020   CALCIUM 9.5 03/06/2020    CALCIUM 9.3 02/11/2020   No h/o CKD. Last BUN/Cr: Lab Results  Component Value Date   BUN 14 01/18/2023   CREATININE 0.77 01/18/2023   She has a h/o serotonin sd. From Cymbalta - 05/2020.  ROS: + see HPI + tremors when she is nervous  I reviewed pt's medications, allergies, PMH, social hx, family hx, and changes were documented in the history of present illness. Otherwise, unchanged from my initial visit note.  Past Medical History:  Diagnosis Date   Altered mental status 05/28/2020   Anxiety    occasional   Arthritis    Depression    Hyperlipidemia    Hypertension    Hypothyroid    hypo thyroid   Osteopenia    Urinary tract infection 12/15/2011   once   Vitamin D deficiency    Past Surgical History:  Procedure Laterality Date   c sections  jan 1980 and march 1984   CATARACT EXTRACTION, BILATERAL Bilateral 2016   Dr. Hazle Quant   COLONOSCOPY     ovarian cyst with lararoscopy march 1998  1998   TOTAL KNEE ARTHROPLASTY  01/03/2012   Procedure: TOTAL KNEE ARTHROPLASTY;  Surgeon: Loanne Drilling, MD;  Location: [facility];  Service: Orthopedics;  Laterality: Right;   TOTAL KNEE ARTHROPLASTY Left 09/11/2012   Procedure: Left Total Knee Arthroplasty;  Surgeon: Loanne Drilling, MD;  Location: [facility];  Service: Orthopedics;  Laterality: Left;   WISDOM TOOTH EXTRACTION  09-05-12   extractions   Social History   Social History   Marital status: Married    Spouse name: N/A   Number of children: 2   Occupational History   Administrator   Social History Main Topics   Smoking status: Never Smoker   Smokeless tobacco: Never Used   Alcohol use Yes     Comment: 1-2 glass wine night   Drug use: No   Current Outpatient Medications on File Prior to Visit  Medication Sig Dispense Refill   ALPRAZolam (XANAX) 0.5 MG tablet Take by mouth as needed.     ARMOUR THYROID 90 MG tablet TAKE 1/2 TABLET BY MOUTH DAILY 45 tablet 3   B Complex-C (B-COMPLEX WITH VITAMIN C) tablet Take 1 tablet  by mouth daily.     escitalopram (LEXAPRO) 5 MG/5ML solution TAKE 1-10ML BY MOUTH DAILY AS PER SLOW TAPER INSTRUCTIONS. REDUCE DOSE BY EVERY 2-4 WEEKS AS DIRECTED (Patient taking differently: Takes 2.4 mL daily) 240 mL 3   MAGNESIUM GLUCONATE PO Take 400 mg by mouth.     Multiple Vitamin (MULTIVITAMIN ADULT PO) Take by mouth.     Omega-3 Fatty Acids (FISH OIL OMEGA-3 PO) Take 1,200 mg by mouth.     VITAMIN D-VITAMIN K  PO Take 1 tablet by mouth daily. Vit D 5000 Iu +K2 100 mcg      No current facility-administered medications on file prior to visit.   Allergies  Allergen Reactions   Oxycodone     "Found Oxycodone not effective for pain control" with last RTKA   Dexamethasone Other (See Comments)    "Hallucinations, Psychosis"    Other     Opiods,causes severe anxiety   Family History  Problem Relation Age of Onset   Hypertension Mother    Thyroid disease Mother    Arthritis Mother    Cancer Mother        gallbladder cancer   COPD Father    Breast cancer Maternal Grandmother        39s   Stroke Maternal Grandmother 75   Breast cancer Paternal Grandmother    Colon cancer Neg Hx    Esophageal cancer Neg Hx    Stomach cancer Neg Hx    Rectal cancer Neg Hx    PE: BP 130/70   Pulse 83   Ht 4\' 11"  (1.499 m)   Wt 126 lb 6.4 oz (57.3 kg)   SpO2 97%   BMI 25.53 kg/m  Wt Readings from Last 3 Encounters:  03/31/23 126 lb 6.4 oz (57.3 kg)  01/18/23 127 lb 12.8 oz (58 kg)  09/23/22 127 lb 3.2 oz (57.7 kg)   Constitutional: normal weight, in NAD Eyes: EOMI, no exophthalmos ENT:no thyromegaly, no cervical lymphadenopathy Cardiovascular: RRR, No MRG Respiratory: CTA B Musculoskeletal: no deformities Skin: moist, warm, no rashes Neurological: + Chronic tremor with outstretched hands  ASSESSMENT: 1. Hypothyroidism 2/2 Hashimoto's thyroiditis  2.  Osteoporosis (OP)  PLAN:  1. Patient with longstanding Hashimoto's hypothyroidism, on Armour thyroid due to persistent  fatigue on levothyroxine.  She felt better on Armour, with more energy. - latest thyroid labs reviewed with pt. >> normal: Lab Results  Component Value Date   TSH 0.86 01/18/2023  - she continues on Armour Thyroid 45 mg daily.  She would not want to switch back to levothyroxine. - pt feels good on this dose.  At last visit she had anxiety, which was longstanding but exacerbated by her husband's passing.  She was worried that this was a sign of excess cortisol.  She had no other signs of Cushing syndrome: No weight gain, muscle weakness, diabetes, hypertension, so any investigation was not pursued at that time.  We did discuss that the T3 part of Armour could cause anxiety but she mentions that the anxiety started before taking normal in the morning -At today's visit, she inquires about the utility of checking reverse T3.  I advised her that this has no utility in her situation.  This is used mostly for patients in the ICU, with to diagnose euthyroid sick syndrome.  She does mention that she still has anxiety and has again hair loss, and I advised her that these do not appear to be related to the thyroid. - we discussed about taking the thyroid hormone every day, with water, >30 minutes before breakfast, separated by >4 hours from acid reflux medications, calcium, iron, multivitamins. Pt. is taking it correctly. -I will see her back in a year  2. OP -Likely postmenopausal/age-related, possibly also due to low calcium intake.  She also has a family history of osteoporosis -No history of fragility fractures.  she has a history of a toe fracture, but this does not qualify as an osteoporotic fracture -We discussed at previous visit about  getting 1000 to 1200 mg of calcium daily preferentially from the diet and also maintaining a normal vitamin D level.  She continues on 5000 units vitamin D daily and also takes K2 vitamin.  She is drinking oat milk.  Latest vitamin D level was normal 6 months ago: Lab  Results  Component Value Date   VD25OH 10 09/23/2022  -We discussed about weightbearing exercise and I recommended to do this 5 times a week.  She did not start.  We again discussed about the importance of doing weightbearing exercises and I suggested to use dumbbells or elastic bands. -She was previously on Evista for a long time and we discussed that this was helping with osteoporosis, but she came off in 2022.  At that time, we discussed different osteoporosis medication classes, mechanism of action, and possible side effects.  She was previously prescribed Fosamax, which she had at home but did not start.  She wanted to wait until the next bone density to make a decision whether to start the medication or not. -She is due for another bone density scan -has this scheduled for 06/2023.  Stable or increasing T-scores are desirable. -I will be in touch with her about the results -I will have her come back in 1 year  Carlus Pavlov, MD PhD [facility]

## 2023-04-21 ENCOUNTER — Encounter: Payer: Self-pay | Admitting: Internal Medicine

## 2023-04-21 ENCOUNTER — Ambulatory Visit (INDEPENDENT_AMBULATORY_CARE_PROVIDER_SITE_OTHER): Payer: Medicare Other | Admitting: Internal Medicine

## 2023-04-21 VITALS — BP 118/70 | HR 71 | Temp 97.9°F | Resp 16 | Ht 59.0 in | Wt 130.0 lb

## 2023-04-21 DIAGNOSIS — R0989 Other specified symptoms and signs involving the circulatory and respiratory systems: Secondary | ICD-10-CM

## 2023-04-21 DIAGNOSIS — Z23 Encounter for immunization: Secondary | ICD-10-CM | POA: Diagnosis not present

## 2023-04-21 DIAGNOSIS — E559 Vitamin D deficiency, unspecified: Secondary | ICD-10-CM | POA: Diagnosis not present

## 2023-04-21 DIAGNOSIS — E782 Mixed hyperlipidemia: Secondary | ICD-10-CM

## 2023-04-21 DIAGNOSIS — R7309 Other abnormal glucose: Secondary | ICD-10-CM | POA: Diagnosis not present

## 2023-04-21 DIAGNOSIS — E063 Autoimmune thyroiditis: Secondary | ICD-10-CM | POA: Diagnosis not present

## 2023-04-21 DIAGNOSIS — E038 Other specified hypothyroidism: Secondary | ICD-10-CM | POA: Diagnosis not present

## 2023-04-21 DIAGNOSIS — Z79899 Other long term (current) drug therapy: Secondary | ICD-10-CM

## 2023-04-21 NOTE — Progress Notes (Signed)
Future Appointments  Date Time Provider Department  04/21/2023  3:30 PM Lucky Cowboy, MD [facility]  09/27/2023                              [facility]pe 10:00 AM Adela Glimpse, NP [facility]  04/03/2024 10:40 AM Carlus Pavlov, MD [facility]    History of Present Illness:       This very nice 72 y.o.  widowed WF  presents for 3 month follow up with HTN, HLD, Pre-Diabetes and Vitamin D Deficiency.         Patient is treated for HTN  since 2009 & BP has been controlled at home. Today's BP is at goal - 118/70. Patient has had no complaints of any cardiac type chest pain, palpitations, dyspnea / orthopnea / PND, dizziness, claudication, or dependent edema.        Hyperlipidemia is controlled with diet & meds. Patient denies myalgias or other med SE's. Last Lipids were at goal :  Lab Results  Component Value Date   CHOL 168 09/23/2022   HDL 77 09/23/2022   LDLCALC 76 09/23/2022   TRIG 69 09/23/2022   CHOLHDL 2.2 09/23/2022     Also, the patient has history of  PreDiabetes predating (A1c - 5.9%  /2013 and A1c - 6.0% /2015) and has had no symptoms of reactive hypoglycemia, diabetic polys, paresthesias or visual blurring.  Last A1c was near goal :  Lab Results  Component Value Date   HGBA1C 5.8 (H) 01/18/2023                                                     Patient has hx/o Hypothyroidism predating from 2012 and then Hashimoto's Thyroiditis in 2017 and is ARMOUR THYROID  followed by Dr Elvera Lennox.                                                       Further, the patient also has history of Vitamin D Deficiency and supplements vitamin D . Last vitamin D was at goal :  Lab Results  Component Value Date   VD25OH 93 09/23/2022     Current Outpatient Medications on File Prior to Visit  Medication Sig   ARMOUR THYROID 90 MG  Take 0.5 tablets daily.   B Complex-C  Take 1 tablet daily.   MAGNESIUM 400 mg  Take daily   Multiple Vitamin  Take daily   Omega-3 FISH OIL  OMEGA-3  Take 1,200 mg    VIT D 5000 u /VIT K2 100 mcg Take 1 tablet  daily     Allergies  Allergen Reactions   Oxycodone     "Found Oxycodone not effective for pain control" with last RTKA   Dexamethasone Other (See Comments)    "Hallucinations, Psychosis"   Other     Opiods,causes severe anxiety     PMHx:   Past Medical History:  Diagnosis Date   Altered mental status 05/28/2020   Anxiety    occasional   Arthritis    Depression    Hyperlipidemia    Hypertension    Hypothyroid  hypo thyroid   Osteopenia    Urinary tract infection 12/15/2011   once   Vitamin D deficiency      Immunization History  Administered Date(s) Administered   Fluad Quad(high Dose 65+) 04/12/2019   Influenza Inj Mdck Q 05/23/2017   Influenza Split 05/10/2014, 05/19/2015   Influenza, High Dose  07/02/2018, 08/14/2018, 06/09/2021, 04/21/2023   Influenza,inj,Quad  03/23/2016   Influenza 04/12/2019, 07/07/2020   PFIZER SARS-COV-2 Vacc 09/13/2019, 10/08/2019, 07/07/2020, 02/25/2021   Pneumococcal -13 07/09/2019   Pneumococcal-23 03/14/2018   Td 06/02/2006   Tdap 03/14/2018     Past Surgical History:  Procedure Laterality Date   c sections  jan 1980 and march 1984   CATARACT EXTRACTION, BILATERAL Bilateral 2016   Dr. Hazle Quant   COLONOSCOPY     ovarian cyst with lararoscopy march 1998  1998   TOTAL KNEE ARTHROPLASTY  01/03/2012   Procedure: TOTAL KNEE ARTHROPLASTY;  Surgeon: Loanne Drilling, MD;  Location: [facility];  Service: Orthopedics;  Laterality: Right;   TOTAL KNEE ARTHROPLASTY Left 09/11/2012   Procedure: Left Total Knee Arthroplasty;  Surgeon: Loanne Drilling, MD;  Location: [facility];  Service: Orthopedics;  Laterality: Left;   WISDOM TOOTH EXTRACTION  09-05-12   extractions     FHx:    Reviewed / unchanged   SHx:    Reviewed / unchanged    Systems Review:  Constitutional: Denies fever, chills, wt changes, headaches, insomnia, fatigue, night sweats, change in appetite. Eyes:  Denies redness, blurred vision, diplopia, discharge, itchy, watery eyes.  ENT: Denies discharge, congestion, post nasal drip, epistaxis, sore throat, earache, hearing loss, dental pain, tinnitus, vertigo, sinus pain, snoring.  CV: Denies chest pain, palpitations, irregular heartbeat, syncope, dyspnea, diaphoresis, orthopnea, PND, claudication or edema. Respiratory: denies cough, dyspnea, DOE, pleurisy, hoarseness, laryngitis, wheezing.  Gastrointestinal: Denies dysphagia, odynophagia, heartburn, reflux, water brash, abdominal pain or cramps, nausea, vomiting, bloating, diarrhea, constipation, hematemesis, melena, hematochezia  or hemorrhoids. Genitourinary: Denies dysuria, frequency, urgency, nocturia, hesitancy, discharge, hematuria or flank pain. Musculoskeletal: Denies arthralgias, myalgias, stiffness, jt. swelling, pain, limping or strain/sprain.  Skin: Denies pruritus, rash, hives, warts, acne, eczema or change in skin lesion(s). Neuro: No weakness, tremor, incoordination, spasms, paresthesia or pain. Psychiatric: Denies confusion, memory loss or sensory loss. Endo: Denies change in weight, skin or hair change.  Heme/Lymph: No excessive bleeding, bruising or enlarged lymph nodes.    Physical Exam  BP 118/70   Pulse 71   Temp 97.9 F (36.6 C)   Resp 16   Ht 4\' 11"  (1.499 m)   Wt 130 lb (59 kg)   SpO2 97%   BMI 26.26 kg/m   Appears  well nourished, well groomed  and in no distress.  Eyes: PERRLA, EOMs, conjunctiva no swelling or erythema. Sinuses: No frontal/maxillary tenderness ENT/Mouth: EAC's clear, TM's nl w/o erythema, bulging. Nares clear w/o erythema, swelling, exudates. Oropharynx clear without erythema or exudates. Oral hygiene is good. Tongue normal, non obstructing. Hearing intact.  Neck: Supple. Thyroid not palpable. Car 2+/2+ without bruits, nodes or JVD. Chest: Respirations nl with BS clear & equal w/o rales, rhonchi, wheezing or stridor.  Cor: Heart sounds normal  w/ regular rate and rhythm without sig. murmurs, gallops, clicks or rubs. Peripheral pulses normal and equal  without edema.  Abdomen: Soft & bowel sounds normal. Non-tender w/o guarding, rebound, hernias, masses or organomegaly.  Lymphatics: Unremarkable.  Musculoskeletal: Full ROM all peripheral extremities, joint stability, 5/5 strength and normal gait.  Skin: Warm, dry without exposed  rashes, lesions or ecchymosis apparent.  Neuro: Cranial nerves intact, reflexes equal bilaterally. Sensory-motor testing grossly intact. Tendon reflexes grossly intact.  Pysch: Alert & oriented x 3.  Insight and judgement nl & appropriate. No ideations.   Assessment and Plan:   1. Labile hypertension  - Continue medication, monitor blood pressure at home.  - Continue DASH diet.  Reminder to go to the ER if any CP,  SOB, nausea, dizziness, severe HA, changes vision/speech.    - CBC with Differential/Platelet - COMPLETE METABOLIC PANEL WITH GFR - Magnesium - TSH  - Continue diet/meds, exercise,& lifestyle modifications.  - Continue monitor periodic cholesterol/liver & renal functions     2. Hyperlipidemia, mixed  - Lipid panel - TSH   3. Abnormal glucose  - Continue diet, exercise  - Lifestyle modifications.  - Monitor appropriate labs    - Hemoglobin A1c - Insulin, random   4. Vitamin D deficiency  - Continue supplementation.   - VITAMIN D 25 Hydroxy   5. Hypothyroidism due to Hashimoto's thyroiditis  - TSH   6. Medication management  - CBC with Differential/Platelet - COMPLETE METABOLIC PANEL WITH GFR - Magnesium - Lipid panel - TSH - Hemoglobin A1c - Insulin, random - VITAMIN D 25 Hydroxy    7. Need for immunization against influenza  - Flu vaccine HIGH DOSE PF (Fluzone High dose)          Discussed  regular exercise, BP monitoring, weight control to achieve/maintain BMI less than 25 and discussed med and SE's. Recommended labs to assess /monitor clinical  status .  I discussed the assessment and treatment plan with the patient. The patient was provided an opportunity to ask questions and all were answered. The patient agreed with the plan and demonstrated an understanding of the instructions.  I provided over 30 minutes of exam, counseling, chart review and  complex critical decision making.        The patient was advised to call back or seek an in-person evaluation if the symptoms worsen or if the condition fails to improve as anticipated.   Marinus Maw, MD

## 2023-04-21 NOTE — Patient Instructions (Signed)
PLAN: Due to recent changes in healthcare laws, you may see the results of your imaging and laboratory studies on MyChart before your provider has had a chance to review them.  We understand that in some cases there may be results that are confusing or concerning to you. Not all laboratory results come back in the same time frame and the provider may be waiting for multiple results in order to interpret others.  Please give [place] 48 hours in order for your provider to thoroughly review all the results before contacting the office for clarification of your results.  ++++++++++++++++++++++++++  Vit D  & Vit C 1,000 mg   are recommended to help protect  against the Covid-19 and other Corona viruses.    Also it's recommended  to take  Zinc 50 mg  to help  protect against the Covid-19   and best place to get  is also on [email or web address]  and don't pay more than 6-8 cents /pill !   +++++++++++++++++++++++++++++++++++++++ Recommend Adult Low Dose Aspirin or  coated  Aspirin 81 mg daily  To reduce risk of Colon Cancer 40 %,  Skin Cancer 26 % ,  Melanoma 46%  and  Pancreatic cancer 60% +++++++++++++++++++++++++++++++++++++++++ Vitamin D goal  is between 70-100.  Please make sure that you are taking your Vitamin D as directed.  It is very important as a natural anti-inflammatory  helping hair, skin, and nails, as well as reducing stroke and heart attack risk.  It helps your bones and helps with mood. It also decreases numerous cancer risks so please take it as directed.  Low Vit D is associated with a 200-300% higher risk for CANCER  and 200-300% higher risk for HEART   ATTACK  &  STROKE.   .....................................Marland Kitchen It is also associated with higher death rate at younger ages,  autoimmune diseases like Rheumatoid arthritis, Lupus, Multiple Sclerosis.    Also many other serious conditions, like depression, Alzheimer's Dementia, infertility, muscle aches, fatigue, fibromyalgia - just to  name a few. +++++++++++++++++++++++++++++++++++++++++ Recommend the book "The END of DIETING" by Dr Monico Hoar  & the book "The END of DIABETES " by Dr Monico Hoar At [email or web address] - get book & Audio CD's    Being diabetic has a  300% increased risk for heart attack, stroke, cancer, and alzheimer- type vascular dementia. It is very important that you work harder with diet by avoiding all foods that are white. Avoid white rice (brown & wild rice is OK), white potatoes (sweetpotatoes in moderation is OK), White bread or wheat bread or anything made out of white flour like bagels, donuts, rolls, buns, biscuits, cakes, pastries, cookies, pizza crust, and pasta (made from white flour & egg whites) - vegetarian pasta or spinach or wheat pasta is OK. Multigrain breads like Arnold's or Pepperidge Farm, or multigrain sandwich thins or flatbreads.  Diet, exercise and weight loss can reverse and cure diabetes in the early stages.  Diet, exercise and weight loss is very important in the control and prevention of complications of diabetes which affects every system in your body, ie. Brain - dementia/stroke, eyes - glaucoma/blindness, heart - heart attack/heart failure, kidneys - dialysis, stomach - gastric paralysis, intestines - malabsorption, nerves - severe painful neuritis, circulation - gangrene & loss of a leg(s), and finally cancer and Alzheimers.    I recommend avoid fried & greasy foods,  sweets/candy, white rice (brown or wild rice or Quinoa is OK), white potatoes (sweet potatoes are OK) -  anything made from white flour - bagels, doughnuts, rolls, buns, biscuits,white and wheat breads, pizza crust and traditional pasta made of white flour & egg white(vegetarian pasta or spinach or wheat pasta is OK).  Multi-grain bread is OK - like multi-grain flat bread or sandwich thins. Avoid alcohol in excess. Exercise is also important.    Eat all the vegetables you want - avoid meat, especially red meat and dairy -  especially cheese.  Cheese is the most concentrated form of trans-fats which is the worst thing to clog up our arteries. Veggie cheese is OK which can be found in the fresh produce section at [employer] or [employer] or [employer]  +++++++++++++++++++++++++++++++++++++++ DASH Eating Plan  DASH stands for "Dietary Approaches to Stop Hypertension."   The DASH eating plan is a healthy eating plan that has been shown to reduce high blood pressure (hypertension). Additional health benefits may include reducing the risk of type 2 diabetes mellitus, heart disease, and stroke. The DASH eating plan may also help with weight loss. WHAT DO I NEED TO KNOW ABOUT THE DASH EATING PLAN? For the DASH eating plan, you will follow these general guidelines: Choose foods with a percent daily value for sodium of less than 5% (as listed on the food label). Use salt-free seasonings or herbs instead of table salt or sea salt. Check with your health care provider or pharmacist before using salt substitutes. Eat lower-sodium products, often labeled as "lower sodium" or "no salt added." Eat fresh foods. Eat more vegetables, fruits, and low-fat dairy products. Choose whole grains. Look for the word "whole" as the first word in the ingredient list. Choose fish  Limit sweets, desserts, sugars, and sugary drinks. Choose heart-healthy fats. Eat veggie cheese  Eat more home-cooked food and less restaurant, buffet, and fast food. Limit fried foods. Cook foods using methods other than frying. Limit canned vegetables. If you do use them, rinse them well to decrease the sodium. When eating at a restaurant, ask that your food be prepared with less salt, or no salt if possible.                      WHAT FOODS CAN I EAT? Read Dr Francis Dowse Fuhrman's books on The End of Dieting & The End of Diabetes  Grains Whole grain or whole wheat bread. Brown rice. Whole grain or whole wheat pasta. Quinoa, bulgur, and whole grain cereals.  Low-sodium cereals. Corn or whole wheat flour tortillas. Whole grain cornbread. Whole grain crackers. Low-sodium crackers.  Vegetables Fresh or frozen vegetables (raw, steamed, roasted, or grilled). Low-sodium or reduced-sodium tomato and vegetable juices. Low-sodium or reduced-sodium tomato sauce and paste. Low-sodium or reduced-sodium canned vegetables.   Fruits All fresh, canned (in natural juice), or frozen fruits.  Protein Products  All fish and seafood.  Dried beans, peas, or lentils. Unsalted nuts and seeds. Unsalted canned beans.  Dairy Low-fat dairy products, such as skim or 1% milk, 2% or reduced-fat cheeses, low-fat ricotta or cottage cheese, or plain low-fat yogurt. Low-sodium or reduced-sodium cheeses.  Fats and Oils Tub margarines without trans fats. Light or reduced-fat mayonnaise and salad dressings (reduced sodium). Avocado. Safflower, olive, or canola oils. Natural peanut or almond butter.  Other Unsalted popcorn and pretzels. The items listed above may not be a complete list of recommended foods or beverages. Contact your dietitian for more options.  +++++++++++++++  WHAT FOODS ARE NOT RECOMMENDED? Grains/ White flour or wheat flour White bread. White pasta. White rice.  Refined cornbread. Bagels and croissants. Crackers that contain trans fat.  Vegetables  Creamed or fried vegetables. Vegetables in a . Regular canned vegetables. Regular canned tomato sauce and paste. Regular tomato and vegetable juices.  Fruits Dried fruits. Canned fruit in light or heavy syrup. Fruit juice.  Meat and Other Protein Products Meat in general - RED meat & White meat.  Fatty cuts of meat. Ribs, chicken wings, all processed meats as bacon, sausage, bologna, salami, fatback, hot dogs, bratwurst and packaged luncheon meats.  Dairy Whole or 2% milk, cream, half-and-half, and cream cheese. Whole-fat or sweetened yogurt. Full-fat cheeses or blue cheese. Non-dairy creamers and whipped  toppings. Processed cheese, cheese spreads, or cheese curds.  Condiments Onion and garlic salt, seasoned salt, table salt, and sea salt. Canned and packaged gravies. Worcestershire sauce. Tartar sauce. Barbecue sauce. Teriyaki sauce. Soy sauce, including reduced sodium. Steak sauce. Fish sauce. Oyster sauce. Cocktail sauce. Horseradish. Ketchup and mustard. Meat flavorings and tenderizers. Bouillon cubes. Hot sauce. Tabasco sauce. Marinades. Taco seasonings. Relishes.  Fats and Oils Butter, stick margarine, lard, shortening and bacon fat. Coconut, palm kernel, or palm oils. Regular salad dressings.  Pickles and olives. Salted popcorn and pretzels.  The items listed above may not be a complete list of foods and beverages to avoid.

## 2023-04-22 ENCOUNTER — Encounter: Payer: Self-pay | Admitting: Internal Medicine

## 2023-04-22 LAB — COMPLETE METABOLIC PANEL WITH GFR
AG Ratio: 1.6 (calc) (ref 1.0–2.5)
ALT: 28 U/L (ref 6–29)
AST: 25 U/L (ref 10–35)
Albumin: 4.4 g/dL (ref 3.6–5.1)
Alkaline phosphatase (APISO): 83 U/L (ref 37–153)
BUN: 15 mg/dL (ref 7–25)
CO2: 29 mmol/L (ref 20–32)
Calcium: 9.7 mg/dL (ref 8.6–10.4)
Chloride: 103 mmol/L (ref 98–110)
Creat: 0.71 mg/dL (ref 0.60–1.00)
Globulin: 2.8 g/dL (calc) (ref 1.9–3.7)
Glucose, Bld: 84 mg/dL (ref 65–99)
Potassium: 4.3 mmol/L (ref 3.5–5.3)
Sodium: 140 mmol/L (ref 135–146)
Total Bilirubin: 0.6 mg/dL (ref 0.2–1.2)
Total Protein: 7.2 g/dL (ref 6.1–8.1)
eGFR: 91 mL/min/{1.73_m2} (ref >=60)

## 2023-04-22 LAB — TSH: TSH: 1.07 mIU/L (ref 0.40–4.50)

## 2023-04-22 LAB — HEMOGLOBIN A1C
Hgb A1c MFr Bld: 5.7 % of total Hgb — ABNORMAL HIGH (ref <5.7)
Mean Plasma Glucose: 117 mg/dL
eAG (mmol/L): 6.5 mmol/L

## 2023-04-22 LAB — CBC WITH DIFFERENTIAL/PLATELET
Absolute Monocytes: 689 cells/uL (ref 200–950)
Basophils Absolute: 57 cells/uL (ref 0–200)
Basophils Relative: 0.8 %
Eosinophils Absolute: 128 cells/uL (ref 15–500)
Eosinophils Relative: 1.8 %
HCT: 41.8 % (ref 35.0–45.0)
Hemoglobin: 13.5 g/dL (ref 11.7–15.5)
Lymphs Abs: 2521 cells/uL (ref 850–3900)
MCH: 29.7 pg (ref 27.0–33.0)
MCHC: 32.3 g/dL (ref 32.0–36.0)
MCV: 92.1 fL (ref 80.0–100.0)
MPV: 9.4 fL (ref 7.5–12.5)
Monocytes Relative: 9.7 %
Neutro Abs: 3706 cells/uL (ref 1500–7800)
Neutrophils Relative %: 52.2 %
Platelets: 308 10*3/uL (ref 140–400)
RBC: 4.54 10*6/uL (ref 3.80–5.10)
RDW: 13.1 % (ref 11.0–15.0)
Total Lymphocyte: 35.5 %
WBC: 7.1 10*3/uL (ref 3.8–10.8)

## 2023-04-22 LAB — LIPID PANEL
Cholesterol: 222 mg/dL — ABNORMAL HIGH (ref <200)
HDL: 105 mg/dL (ref >=50)
LDL Cholesterol (Calc): 103 mg/dL (calc) — ABNORMAL HIGH
Non-HDL Cholesterol (Calc): 117 mg/dL (calc) (ref <130)
Total CHOL/HDL Ratio: 2.1 (calc) (ref <5.0)
Triglycerides: 56 mg/dL (ref <150)

## 2023-04-22 LAB — MAGNESIUM: Magnesium: 2.3 mg/dL (ref 1.5–2.5)

## 2023-04-22 LAB — VITAMIN D 25 HYDROXY (VIT D DEFICIENCY, FRACTURES): Vit D, 25-Hydroxy: 72 ng/mL (ref 30–100)

## 2023-04-22 LAB — INSULIN, RANDOM: Insulin: 7 u[IU]/mL

## 2023-05-12 DIAGNOSIS — F411 Generalized anxiety disorder: Secondary | ICD-10-CM | POA: Diagnosis not present

## 2023-05-12 DIAGNOSIS — F330 Major depressive disorder, recurrent, mild: Secondary | ICD-10-CM | POA: Diagnosis not present

## 2023-06-14 ENCOUNTER — Ambulatory Visit
Admission: RE | Admit: 2023-06-14 | Discharge: 2023-06-14 | Disposition: A | Discharge status: Home / Self Care | Payer: Medicare Other | Source: Ambulatory Visit | Attending: Nurse Practitioner | Admitting: Nurse Practitioner

## 2023-06-14 DIAGNOSIS — E2839 Other primary ovarian failure: Secondary | ICD-10-CM | POA: Diagnosis not present

## 2023-06-14 DIAGNOSIS — N958 Other specified menopausal and perimenopausal disorders: Secondary | ICD-10-CM | POA: Diagnosis not present

## 2023-06-14 DIAGNOSIS — M810 Age-related osteoporosis without current pathological fracture: Secondary | ICD-10-CM

## 2023-06-14 DIAGNOSIS — Z79899 Other long term (current) drug therapy: Secondary | ICD-10-CM

## 2023-06-14 DIAGNOSIS — M8588 Other specified disorders of bone density and structure, other site: Secondary | ICD-10-CM | POA: Diagnosis not present

## 2023-07-07 DIAGNOSIS — F411 Generalized anxiety disorder: Secondary | ICD-10-CM | POA: Diagnosis not present

## 2023-07-07 DIAGNOSIS — F330 Major depressive disorder, recurrent, mild: Secondary | ICD-10-CM | POA: Diagnosis not present

## 2023-07-28 ENCOUNTER — Ambulatory Visit: Payer: Medicare Other | Admitting: Internal Medicine

## 2023-08-17 ENCOUNTER — Encounter: Payer: Self-pay | Admitting: Internal Medicine

## 2023-08-17 NOTE — Patient Instructions (Addendum)
 Due to recent changes in healthcare laws, you may see the results of your imaging and laboratory studies on MyChart before your provider has had a chance to review them.  We understand that in some cases there may be results that are confusing or concerning to you. Not all laboratory results come back in the same time frame and the provider may be waiting for multiple results in order to interpret others.  Please give [place] 48 hours in order for your provider to thoroughly review all the results before contacting the office for clarification of your results.  ++++++++++++++++++++++++++  Vit D  & Vit C 1,000 mg   are recommended to help protect  against the Covid-19 and other Corona viruses.    Also it's recommended  to take  Zinc 50 mg  to help  protect against the Covid-19   and best place to get  is also on [email or web address]  and don't pay more than 6-8 cents /pill !   +++++++++++++++++++++++++++++++++++++++ Recommend Adult Low Dose Aspirin or  coated  Aspirin 81 mg daily  To reduce risk of Colon Cancer 40 %,  Skin Cancer 26 % ,  Melanoma 46%  and  Pancreatic cancer 60% +++++++++++++++++++++++++++++++++++++++++ Vitamin D goal  is between 70-100.  Please make sure that you are taking your Vitamin D as directed.  It is very important as a natural anti-inflammatory  helping hair, skin, and nails, as well as reducing stroke and heart attack risk.  It helps your bones and helps with mood. It also decreases numerous cancer risks so please take it as directed.  Low Vit D is associated with a 200-300% higher risk for CANCER  and 200-300% higher risk for HEART   ATTACK  &  STROKE.   .....................................Marland Kitchen It is also associated with higher death rate at younger ages,  autoimmune diseases like Rheumatoid arthritis, Lupus, Multiple Sclerosis.    Also many other serious conditions, like depression, Alzheimer's Dementia, infertility, muscle aches, fatigue, fibromyalgia - just to name  a few. +++++++++++++++++++++++++++++++++++++++++ Recommend the book "The END of DIETING" by Dr Monico Hoar  & the book "The END of DIABETES " by Dr Monico Hoar At [email or web address] - get book & Audio CD's    Being diabetic has a  300% increased risk for heart attack, stroke, cancer, and alzheimer- type vascular dementia. It is very important that you work harder with diet by avoiding all foods that are white. Avoid white rice (brown & wild rice is OK), white potatoes (sweetpotatoes in moderation is OK), White bread or wheat bread or anything made out of white flour like bagels, donuts, rolls, buns, biscuits, cakes, pastries, cookies, pizza crust, and pasta (made from white flour & egg whites) - vegetarian pasta or spinach or wheat pasta is OK. Multigrain breads like Arnold's or Pepperidge Farm, or multigrain sandwich thins or flatbreads.  Diet, exercise and weight loss can reverse and cure diabetes in the early stages.  Diet, exercise and weight loss is very important in the control and prevention of complications of diabetes which affects every system in your body, ie. Brain - dementia/stroke, eyes - glaucoma/blindness, heart - heart attack/heart failure, kidneys - dialysis, stomach - gastric paralysis, intestines - malabsorption, nerves - severe painful neuritis, circulation - gangrene & loss of a leg(s), and finally cancer and Alzheimers.    I recommend avoid fried & greasy foods,  sweets/candy, white rice (brown or wild rice or Quinoa is OK), white potatoes (sweet potatoes are OK) - anything  made from white flour - bagels, doughnuts, rolls, buns, biscuits,white and wheat breads, pizza crust and traditional pasta made of white flour & egg white(vegetarian pasta or spinach or wheat pasta is OK).  Multi-grain bread is OK - like multi-grain flat bread or sandwich thins. Avoid alcohol in excess. Exercise is also important.    Eat all the vegetables you want - avoid meat, especially red meat and dairy - especially  cheese.  Cheese is the most concentrated form of trans-fats which is the worst thing to clog up our arteries. Veggie cheese is OK which can be found in the fresh produce section at [employer] or [employer] or [employer]  +++++++++++++++++++++++++++++++++++++++ DASH Eating Plan  DASH stands for "Dietary Approaches to Stop Hypertension."   The DASH eating plan is a healthy eating plan that has been shown to reduce high blood pressure (hypertension). Additional health benefits may include reducing the risk of type 2 diabetes mellitus, heart disease, and stroke. The DASH eating plan may also help with weight loss. WHAT DO I NEED TO KNOW ABOUT THE DASH EATING PLAN? For the DASH eating plan, you will follow these general guidelines: Choose foods with a percent daily value for sodium of less than 5% (as listed on the food label). Use salt-free seasonings or herbs instead of table salt or sea salt. Check with your health care provider or pharmacist before using salt substitutes. Eat lower-sodium products, often labeled as "lower sodium" or "no salt added." Eat fresh foods. Eat more vegetables, fruits, and low-fat dairy products. Choose whole grains. Look for the word "whole" as the first word in the ingredient list. Choose fish  Limit sweets, desserts, sugars, and sugary drinks. Choose heart-healthy fats. Eat veggie cheese  Eat more home-cooked food and less restaurant, buffet, and fast food. Limit fried foods. Cook foods using methods other than frying. Limit canned vegetables. If you do use them, rinse them well to decrease the sodium. When eating at a restaurant, ask that your food be prepared with less salt, or no salt if possible.                      WHAT FOODS CAN I EAT? Read Dr Francis Dowse Fuhrman's books on The End of Dieting & The End of Diabetes  Grains Whole grain or whole wheat bread. Brown rice. Whole grain or whole wheat pasta. Quinoa, bulgur, and whole grain cereals. Low-sodium  cereals. Corn or whole wheat flour tortillas. Whole grain cornbread. Whole grain crackers. Low-sodium crackers.  Vegetables Fresh or frozen vegetables (raw, steamed, roasted, or grilled). Low-sodium or reduced-sodium tomato and vegetable juices. Low-sodium or reduced-sodium tomato sauce and paste. Low-sodium or reduced-sodium canned vegetables.   Fruits All fresh, canned (in natural juice), or frozen fruits.  Protein Products  All fish and seafood.  Dried beans, peas, or lentils. Unsalted nuts and seeds. Unsalted canned beans.  Dairy Low-fat dairy products, such as skim or 1% milk, 2% or reduced-fat cheeses, low-fat ricotta or cottage cheese, or plain low-fat yogurt. Low-sodium or reduced-sodium cheeses.  Fats and Oils Tub margarines without trans fats. Light or reduced-fat mayonnaise and salad dressings (reduced sodium). Avocado. Safflower, olive, or canola oils. Natural peanut or almond butter.  Other Unsalted popcorn and pretzels. The items listed above may not be a complete list of recommended foods or beverages. Contact your dietitian for more options.  +++++++++++++++  WHAT FOODS ARE NOT RECOMMENDED? Grains/ White flour or wheat flour White bread. White pasta. White rice. Refined  cornbread. Bagels and croissants. Crackers that contain trans fat.  Vegetables  Creamed or fried vegetables. Vegetables in a . Regular canned vegetables. Regular canned tomato sauce and paste. Regular tomato and vegetable juices.  Fruits Dried fruits. Canned fruit in light or heavy syrup. Fruit juice.  Meat and Other Protein Products Meat in general - RED meat & White meat.  Fatty cuts of meat. Ribs, chicken wings, all processed meats as bacon, sausage, bologna, salami, fatback, hot dogs, bratwurst and packaged luncheon meats.  Dairy Whole or 2% milk, cream, half-and-half, and cream cheese. Whole-fat or sweetened yogurt. Full-fat cheeses or blue cheese. Non-dairy creamers and whipped toppings.  Processed cheese, cheese spreads, or cheese curds.  Condiments Onion and garlic salt, seasoned salt, table salt, and sea salt. Canned and packaged gravies. Worcestershire sauce. Tartar sauce. Barbecue sauce. Teriyaki sauce. Soy sauce, including reduced sodium. Steak sauce. Fish sauce. Oyster sauce. Cocktail sauce. Horseradish. Ketchup and mustard. Meat flavorings and tenderizers. Bouillon cubes. Hot sauce. Tabasco sauce. Marinades. Taco seasonings. Relishes.  Fats and Oils Butter, stick margarine, lard, shortening and bacon fat. Coconut, palm kernel, or palm oils. Regular salad dressings.  Pickles and olives. Salted popcorn and pretzels.  The items listed above may not be a complete list of foods and beverages to avoid.

## 2023-08-17 NOTE — Progress Notes (Signed)
ADULT   &   ADOLESCENT      INTERNAL MEDICINE  Lucky Cowboy, M.D.          Rance Muir, ANP        Adela Glimpse, FNP  [facility] [address]  [place], [place]. [address] Telephone [phone] Telefax [phone]  Future Appointments  Date Time Provider Department  08/18/2023 10:00 AM Lucky Cowboy, MD [facility]  11/01/2023                        cpe  3:00 PM Adela Glimpse, NP [facility]  02/02/2024                        wellness  2:30 PM Adela Glimpse, NP Kathalene Frames  04/03/2024 10:40 AM Carlus Pavlov, MD [facility]     History of Present Illness:       This very nice 73 y.o.  widowed WF  presents for 3 month follow up with HTN, HLD, Pre-Diabetes and Vitamin D Deficiency.         Patient is treated for HTN  since 2009 & BP has been controlled at home. Today's BP is at goal - 138/78. Patient has had no complaints of any cardiac type chest pain, palpitations, dyspnea / orthopnea / PND, dizziness, claudication, or dependent edema.        Hyperlipidemia is controlled with diet & meds. Patient denies myalgias or other med SE's. Last Lipids were at goal :  Lab Results  Component Value Date   CHOL 222 (H) 04/21/2023   HDL 105 04/21/2023   LDLCALC 103 (H) 04/21/2023   TRIG 56 04/21/2023   CHOLHDL 2.1 04/21/2023     Also, the patient has history of  PreDiabetes predating (A1c - 5.9%  /2013 and A1c - 6.0% /2015) and has had no symptoms of reactive hypoglycemia, diabetic polys, paresthesias or visual blurring.  Last A1c was near goal :  Lab Results  Component Value Date   HGBA1C 5.7 (H) 04/21/2023                                                     Patient has hx/o Hypothyroidism predating from 2012 and then Hashimoto's Thyroiditis in 2017 and is ARMOUR THYROID  followed by Dr Elvera Lennox.                                                       Further, the patient also has history of Vitamin D Deficiency and  supplements vitamin D . Last vitamin D was at goal :  Lab Results  Component Value Date   VD25OH 72 04/21/2023       Current Outpatient Medications on File Prior to Visit  Medication Sig   ARMOUR THYROID 90 MG  Take 0.5 tablets daily.   B Complex-C  Take 1 tablet daily.   MAGNESIUM 400 mg  Take daily   Multiple Vitamin  Take daily   Omega-3 FISH OIL OMEGA-3  Take 1,200 mg    VIT D 5000 u /VIT K2 100 mcg Take 1 tablet  daily     Allergies  Allergen Reactions   Oxycodone     "Found Oxycodone not effective for pain control" with last RTKA   Dexamethasone Other (See Comments)    "Hallucinations, Psychosis"   Other     Opiods,causes severe anxiety     PMHx:   Past Medical History:  Diagnosis Date   Altered mental status 05/28/2020   Anxiety    occasional   Arthritis    Depression    Hyperlipidemia    Hypertension    Hypothyroid    hypo thyroid   Osteopenia    Urinary tract infection 12/15/2011   once   Vitamin D deficiency      Immunization History  Administered Date(s) Administered   Fluad Quad(high Dose) 04/12/2019   Influenza Inj Mdck Q 05/23/2017   Influenza Split 05/10/2014, 05/19/2015   Influenza, High Dose  07/02/2018, 08/14/2018, 06/09/2021, 04/21/2023   Influenza,inj,Quad  03/23/2016   Influenza 04/12/2019, 07/07/2020   PFIZER SARS-COV-2 Vacc 09/13/2019, 10/08/2019, 07/07/2020, 02/25/2021   Pneumococcal -13 07/09/2019   Pneumococcal-23 03/14/2018   Td 06/02/2006   Tdap 03/14/2018     Past Surgical History:  Procedure Laterality Date   c sections  jan 1980 and march 1984   CATARACT EXTRACTION, BILATERAL Bilateral 2016   Dr. Hazle Quant   COLONOSCOPY     ovarian cyst with lararoscopy march 1998  1998   TOTAL KNEE ARTHROPLASTY  01/03/2012   Procedure: TOTAL KNEE ARTHROPLASTY;  Surgeon: Loanne Drilling, MD;  Location: [facility];  Service: Orthopedics;  Laterality: Right;   TOTAL KNEE ARTHROPLASTY Left 09/11/2012   Procedure: Left Total Knee Arthroplasty;   Surgeon: Loanne Drilling, MD;  Location: [facility];  Service: Orthopedics;  Laterality: Left;   WISDOM TOOTH EXTRACTION  09-05-12   extractions     FHx:    Reviewed / unchanged   SHx:    Reviewed / unchanged    Systems Review:  Constitutional: Denies fever, chills, wt changes, headaches, insomnia, fatigue, night sweats, change in appetite. Eyes: Denies redness, blurred vision, diplopia, discharge, itchy, watery eyes.  ENT: Denies discharge, congestion, post nasal drip, epistaxis, sore throat, earache, hearing loss, dental pain, tinnitus, vertigo, sinus pain, snoring.  CV: Denies chest pain, palpitations, irregular heartbeat, syncope, dyspnea, diaphoresis, orthopnea, PND, claudication or edema. Respiratory: denies cough, dyspnea, DOE, pleurisy, hoarseness, laryngitis, wheezing.  Gastrointestinal: Denies dysphagia, odynophagia, heartburn, reflux, water brash, abdominal pain or cramps, nausea, vomiting, bloating, diarrhea, constipation, hematemesis, melena, hematochezia  or hemorrhoids. Genitourinary: Denies dysuria, frequency, urgency, nocturia, hesitancy, discharge, hematuria or flank pain. Musculoskeletal: Denies arthralgias, myalgias, stiffness, jt. swelling, pain, limping or strain/sprain.  Skin: Denies pruritus, rash, hives, warts, acne, eczema or change in skin lesion(s). Neuro: No weakness, tremor, incoordination, spasms, paresthesia or pain. Psychiatric: Denies confusion, memory loss or sensory loss. Endo: Denies change in weight, skin or hair change.  Heme/Lymph: No excessive bleeding, bruising or enlarged lymph nodes.    Physical Exam  There were no vitals taken for this visit.  Appears  well nourished, well groomed  and in no distress.  Eyes: PERRLA, EOMs, conjunctiva no swelling or erythema. Sinuses: No frontal/maxillary tenderness ENT/Mouth: EAC's clear, TM's nl w/o erythema, bulging. Nares clear w/o erythema, swelling, exudates. Oropharynx clear without erythema or  exudates. Oral hygiene is good. Tongue normal, non obstructing. Hearing intact.  Neck: Supple. Thyroid not palpable. Car 2+/2+ without bruits, nodes or JVD. Chest: Respirations nl with BS clear & equal w/o rales, rhonchi, wheezing or stridor.  Cor:  Heart sounds normal w/ regular rate and rhythm without sig. murmurs, gallops, clicks or rubs. Peripheral pulses normal and equal  without edema.  Abdomen: Soft & bowel sounds normal. Non-tender w/o guarding, rebound, hernias, masses or organomegaly.  Lymphatics: Unremarkable.  Musculoskeletal: Full ROM all peripheral extremities, joint stability, 5/5 strength and normal gait.  Skin: Warm, dry without exposed rashes, lesions or ecchymosis apparent.  Neuro: Cranial nerves intact, reflexes equal bilaterally. Sensory-motor testing grossly intact. Tendon reflexes grossly intact.  Pysch: Alert & oriented x 3.  Insight and judgement nl & appropriate. No ideations.   Assessment and Plan:   1. Labile hypertension  - Continue medication, monitor blood pressure at home.  - Continue DASH diet.  Reminder to go to the ER if any CP,  SOB, nausea, dizziness, severe HA, changes vision/speech.    - CBC with Differential/Platelet - COMPLETE METABOLIC PANEL WITH GFR - Magnesium - TSH  - Continue diet/meds, exercise,& lifestyle modifications.  - Continue monitor periodic cholesterol/liver & renal functions     2. Hyperlipidemia, mixed  - Lipid panel - TSH   3. Abnormal glucose  - Continue diet, exercise  - Lifestyle modifications.  - Monitor appropriate labs    - Hemoglobin A1c - Insulin, random   4. Vitamin D deficiency  - Continue supplementation.   - VITAMIN D 25 Hydroxy   5. Hypothyroidism due to Hashimoto's thyroiditis  - TSH   6. Medication management  - CBC with Differential/Platelet - COMPLETE METABOLIC PANEL WITH GFR - Magnesium - Lipid panel - TSH - Hemoglobin A1c - Insulin, random - VITAMIN D 25 Hydroxy    7. Need  for immunization against influenza  - Flu vaccine HIGH DOSE PF (Fluzone High dose)          Discussed  regular exercise, BP monitoring, weight control to achieve/maintain BMI less than 25 and discussed med and SE's. Recommended labs to assess /monitor clinical status .  I discussed the assessment and treatment plan with the patient. The patient was provided an opportunity to ask questions and all were answered. The patient agreed with the plan and demonstrated an understanding of the instructions.  I provided over 30 minutes of exam, counseling, chart review and  complex critical decision making.        The patient was advised to call back or seek an in-person evaluation if the symptoms worsen or if the condition fails to improve as anticipated.   Marinus Maw, MD

## 2023-08-18 ENCOUNTER — Ambulatory Visit (INDEPENDENT_AMBULATORY_CARE_PROVIDER_SITE_OTHER): Payer: Medicare Other | Admitting: Internal Medicine

## 2023-08-18 VITALS — BP 138/78 | HR 80 | Temp 98.0°F | Resp 16 | Ht 59.0 in | Wt 133.8 lb

## 2023-08-18 DIAGNOSIS — E782 Mixed hyperlipidemia: Secondary | ICD-10-CM | POA: Diagnosis not present

## 2023-08-18 DIAGNOSIS — E559 Vitamin D deficiency, unspecified: Secondary | ICD-10-CM

## 2023-08-18 DIAGNOSIS — R0989 Other specified symptoms and signs involving the circulatory and respiratory systems: Secondary | ICD-10-CM | POA: Diagnosis not present

## 2023-08-18 DIAGNOSIS — Z79899 Other long term (current) drug therapy: Secondary | ICD-10-CM

## 2023-08-18 DIAGNOSIS — R7309 Other abnormal glucose: Secondary | ICD-10-CM

## 2023-08-18 DIAGNOSIS — E063 Autoimmune thyroiditis: Secondary | ICD-10-CM

## 2023-08-19 LAB — CBC WITH DIFFERENTIAL/PLATELET
Absolute Lymphocytes: 1430 {cells}/uL (ref 850–3900)
Absolute Monocytes: 638 {cells}/uL (ref 200–950)
Basophils Absolute: 50 {cells}/uL (ref 0–200)
Basophils Relative: 0.9 %
Eosinophils Absolute: 83 {cells}/uL (ref 15–500)
Eosinophils Relative: 1.5 %
HCT: 42.6 % (ref 35.0–45.0)
Hemoglobin: 14.2 g/dL (ref 11.7–15.5)
MCH: 29.5 pg (ref 27.0–33.0)
MCHC: 33.3 g/dL (ref 32.0–36.0)
MCV: 88.6 fL (ref 80.0–100.0)
MPV: 9.7 fL (ref 7.5–12.5)
Monocytes Relative: 11.6 %
Neutro Abs: 3300 {cells}/uL (ref 1500–7800)
Neutrophils Relative %: 60 %
Platelets: 272 10*3/uL (ref 140–400)
RBC: 4.81 10*6/uL (ref 3.80–5.10)
RDW: 12.8 % (ref 11.0–15.0)
Total Lymphocyte: 26 %
WBC: 5.5 10*3/uL (ref 3.8–10.8)

## 2023-08-19 LAB — COMPLETE METABOLIC PANEL WITH GFR
AG Ratio: 1.4 (calc) (ref 1.0–2.5)
ALT: 29 U/L (ref 6–29)
AST: 27 U/L (ref 10–35)
Albumin: 4.3 g/dL (ref 3.6–5.1)
Alkaline phosphatase (APISO): 82 U/L (ref 37–153)
BUN: 7 mg/dL (ref 7–25)
CO2: 28 mmol/L (ref 20–32)
Calcium: 9.2 mg/dL (ref 8.6–10.4)
Chloride: 105 mmol/L (ref 98–110)
Creat: 0.7 mg/dL (ref 0.60–1.00)
Globulin: 3 g/dL (ref 1.9–3.7)
Glucose, Bld: 113 mg/dL — ABNORMAL HIGH (ref 65–99)
Potassium: 4 mmol/L (ref 3.5–5.3)
Sodium: 141 mmol/L (ref 135–146)
Total Bilirubin: 0.4 mg/dL (ref 0.2–1.2)
Total Protein: 7.3 g/dL (ref 6.1–8.1)
eGFR: 93 mL/min/{1.73_m2} (ref >=60)

## 2023-08-19 LAB — LIPID PANEL
Cholesterol: 178 mg/dL (ref <200)
HDL: 88 mg/dL (ref >=50)
LDL Cholesterol (Calc): 77 mg/dL
Non-HDL Cholesterol (Calc): 90 mg/dL (ref <130)
Total CHOL/HDL Ratio: 2 (calc) (ref <5.0)
Triglycerides: 58 mg/dL (ref <150)

## 2023-08-19 LAB — HEMOGLOBIN A1C
Hgb A1c MFr Bld: 5.7 %{Hb} — ABNORMAL HIGH (ref <5.7)
Mean Plasma Glucose: 117 mg/dL
eAG (mmol/L): 6.5 mmol/L

## 2023-08-19 LAB — INSULIN, RANDOM: Insulin: 9.3 u[IU]/mL

## 2023-08-19 LAB — VITAMIN D 25 HYDROXY (VIT D DEFICIENCY, FRACTURES): Vit D, 25-Hydroxy: 76 ng/mL (ref 30–100)

## 2023-08-19 LAB — TSH: TSH: 1.09 m[IU]/L (ref 0.40–4.50)

## 2023-08-19 LAB — MAGNESIUM: Magnesium: 2.3 mg/dL (ref 1.5–2.5)

## 2023-08-21 ENCOUNTER — Encounter: Payer: Self-pay | Admitting: Nurse Practitioner

## 2023-09-27 ENCOUNTER — Encounter: Payer: Medicare Other | Admitting: Nurse Practitioner

## 2023-10-03 ENCOUNTER — Other Ambulatory Visit: Payer: Self-pay | Admitting: Family Medicine

## 2023-10-03 DIAGNOSIS — Z1231 Encounter for screening mammogram for malignant neoplasm of breast: Secondary | ICD-10-CM

## 2023-10-06 ENCOUNTER — Other Ambulatory Visit: Payer: Self-pay | Admitting: Family Medicine

## 2023-10-06 DIAGNOSIS — Z1231 Encounter for screening mammogram for malignant neoplasm of breast: Secondary | ICD-10-CM

## 2023-10-20 ENCOUNTER — Ambulatory Visit
Admission: RE | Admit: 2023-10-20 | Discharge: 2023-10-20 | Disposition: A | Discharge status: Home / Self Care | Source: Ambulatory Visit | Attending: Family Medicine | Admitting: Family Medicine

## 2023-10-20 DIAGNOSIS — Z1231 Encounter for screening mammogram for malignant neoplasm of breast: Secondary | ICD-10-CM

## 2023-11-01 ENCOUNTER — Encounter: Payer: Medicare Other | Admitting: Nurse Practitioner

## 2023-11-03 ENCOUNTER — Encounter: Payer: Medicare Other | Admitting: Nurse Practitioner

## 2023-12-02 ENCOUNTER — Telehealth: Payer: Self-pay | Admitting: Internal Medicine

## 2023-12-02 DIAGNOSIS — E063 Autoimmune thyroiditis: Secondary | ICD-10-CM

## 2023-12-02 MED ORDER — ARMOUR THYROID 90 MG PO TABS: 45.0000 mg | ORAL_TABLET | Freq: Every day | ORAL | 3 refills | Status: AC

## 2023-12-02 NOTE — Telephone Encounter (Signed)
 Requested Prescriptions   Signed Prescriptions Disp Refills   ARMOUR THYROID  90 MG tablet 45 tablet 3    Sig: Take 0.5 tablets (45 mg total) by mouth daily.    Authorizing Provider: Emilie Harden    Ordering User: Vernon Goodpasture

## 2023-12-02 NOTE — Telephone Encounter (Signed)
 MEDICATION:  Armour Thyroid  ARMOUR THYROID  90 MG tablet  PHARMACY:    [id number] - [place], [place] - [address] AT [facility] [address] & [facility] [address] (Ph: [phone])    HAS THE PATIENT CONTACTED THEIR PHARMACY?  Yes  IS THIS A 90 DAY SUPPLY : Yes  IS PATIENT OUT OF MEDICATION: No  IF NOT; HOW MUCH IS LEFT:   LAST APPOINTMENT DATE: @ 03/31/2023  NEXT APPOINTMENT DATE:@9 /09/2023  DO WE HAVE YOUR PERMISSION TO LEAVE A DETAILED MESSAGE?: Yes  OTHER COMMENTS:    **Let patient know to contact pharmacy at the end of the day to make sure medication is ready. **  ** Please notify patient to allow 48-72 hours to process**  **Encourage patient to contact the pharmacy for refills or they can request refills through [facility]**

## 2023-12-13 ENCOUNTER — Ambulatory Visit: Admitting: Family Medicine

## 2023-12-13 ENCOUNTER — Encounter: Payer: Self-pay | Admitting: Family Medicine

## 2023-12-13 VITALS — BP 128/78 | HR 66 | Temp 98.5°F | Ht 59.0 in | Wt 135.2 lb

## 2023-12-13 DIAGNOSIS — F331 Major depressive disorder, recurrent, moderate: Secondary | ICD-10-CM

## 2023-12-13 DIAGNOSIS — N182 Chronic kidney disease, stage 2 (mild): Secondary | ICD-10-CM | POA: Diagnosis not present

## 2023-12-13 DIAGNOSIS — E063 Autoimmune thyroiditis: Secondary | ICD-10-CM | POA: Diagnosis not present

## 2023-12-13 DIAGNOSIS — Z79899 Other long term (current) drug therapy: Secondary | ICD-10-CM

## 2023-12-13 DIAGNOSIS — F419 Anxiety disorder, unspecified: Secondary | ICD-10-CM

## 2023-12-13 DIAGNOSIS — E559 Vitamin D deficiency, unspecified: Secondary | ICD-10-CM

## 2023-12-13 DIAGNOSIS — I10 Essential (primary) hypertension: Secondary | ICD-10-CM

## 2023-12-13 DIAGNOSIS — M810 Age-related osteoporosis without current pathological fracture: Secondary | ICD-10-CM

## 2023-12-13 DIAGNOSIS — R7309 Other abnormal glucose: Secondary | ICD-10-CM

## 2023-12-13 DIAGNOSIS — E785 Hyperlipidemia, unspecified: Secondary | ICD-10-CM

## 2023-12-13 NOTE — Progress Notes (Unsigned)
 New Patient Office Visit  Subjective    Patient ID: Michelle Miranda, female    DOB: [birth year]  Age: 73 y.o. MRN: [id number]  CC:  Chief Complaint  Patient presents with   Establish Care    Establish Care. Patient was being seen by Dr.McKeown. Patient has been battling anxiety and depression, was on Cymbalta  in the past. Patient believes some of these issues are thyroid  related. Had taken armour thyroid  in the past which seemed to help. Has attempted the combination of Cybalta and lexapro  in the past, had to stop the Lexapro  for a bit due to hallucinations. Is now just taking lexapro  psychiatrist tapering off at 1.45 mL. Patient also recently husband.    HPI Michelle Miranda presents to establish care today. Establish Care (Establish Care. Patient was being seen by Dr.McKeown. Patient has been battling anxiety and depression, was on Cymbalta  in the past. Patient believes some of these issues are thyroid  related. Had taken armour thyroid  in the past which seemed to help. Has attempted the combination of Cybalta and lexapro  in the past, had to stop the Lexapro  for a bit due to hallucinations. Is now just taking lexapro  psychiatrist tapering off at 1.45 mL. Patient also recently husband.) She is currently seeing Len Quale with [facility] and they are managing Lexapro  taper. Per patient, they thought she may have had serotonin syndrome at the combination of Cymbalta  and Lexapro   Denies other concerns today.  Outpatient Encounter Medications as of 12/13/2023  Medication Sig   ARMOUR THYROID  90 MG tablet Take 0.5 tablets (45 mg total) by mouth daily.   B Complex-C (B-COMPLEX WITH VITAMIN C) tablet Take 1 tablet by mouth daily.   MAG THREONATE-NIACINAMIDE ER PO Take 400 mg by mouth daily.   MAGNESIUM GLUCONATE PO Take 400 mg by mouth.   Multiple Vitamin (MULTIVITAMIN ADULT PO) Take by mouth.   Omega-3 Fatty Acids (FISH OIL OMEGA-3 PO) Take 1,200 mg by mouth.   VITAMIN D -VITAMIN K PO Take  1 tablet by mouth daily. Vit D 5000 Iu +K2 100 mcg    No facility-administered encounter medications on file as of 12/13/2023.    Past Medical History:  Diagnosis Date   Altered mental status 05/28/2020   Anxiety    occasional   Arthritis    Depression    Hyperlipidemia    Hypertension    Hypothyroid    hypo thyroid    Osteopenia    Urinary tract infection 12/15/2011   once   Vitamin D  deficiency     Past Surgical History:  Procedure Laterality Date   c sections  jan 1980 and march 1984   CATARACT EXTRACTION, BILATERAL Bilateral 2016   Dr. Danley Dusky   COLONOSCOPY     ovarian cyst with lararoscopy march 1998  1998   TOTAL KNEE ARTHROPLASTY  01/03/2012   Procedure: TOTAL KNEE ARTHROPLASTY;  Surgeon: Aurther Blue, MD;  Location: [facility];  Service: Orthopedics;  Laterality: Right;   TOTAL KNEE ARTHROPLASTY Left 09/11/2012   Procedure: Left Total Knee Arthroplasty;  Surgeon: Aurther Blue, MD;  Location: [facility];  Service: Orthopedics;  Laterality: Left;   WISDOM TOOTH EXTRACTION  09-05-12   extractions    Family History  Problem Relation Age of Onset   Hypertension Mother    Thyroid  disease Mother    Arthritis Mother    Cancer Mother        gallbladder cancer   COPD Father    Breast cancer Maternal Grandmother  40s   Stroke Maternal Grandmother 33   Breast cancer Paternal Grandmother    Colon cancer Neg Hx    Esophageal cancer Neg Hx    Stomach cancer Neg Hx    Rectal cancer Neg Hx     Social History   Socioeconomic History   Marital status: Widowed    Spouse name: Not on file   Number of children: 2   Years of education: Not on file   Highest education level: Some college, no degree  Occupational History   Not on file  Tobacco Use   Smoking status: Never   Smokeless tobacco: Never  Vaping Use   Vaping status: Never Used  Substance and Sexual Activity   Alcohol use: Not Currently    Alcohol/week: 0.0 standard drinks of alcohol    Comment: formerly  1-2 glasses of wine per night   Drug use: No   Sexual activity: Not Currently    Partners: Male    Birth control/protection: Post-menopausal  Other Topics Concern   Not on file  Social History Narrative   Lives with daughter and grandaughter   Right handed   Drinks caffeine rarely   Social Drivers of Health   Financial Resource Strain: Low Risk  (12/09/2023)   Overall Financial Resource Strain (CARDIA)    Difficulty of Paying Living Expenses: Not hard at all  Food Insecurity: No Food Insecurity (12/09/2023)   Hunger Vital Sign    Worried About Running Out of Food in the Last Year: Never true    Ran Out of Food in the Last Year: Never true  Transportation Needs: No Transportation Needs (12/09/2023)   PRAPARE - Administrator, Civil Service (Medical): No    Lack of Transportation (Non-Medical): No  Physical Activity: Insufficiently Active (12/09/2023)   Exercise Vital Sign    Days of Exercise per Week: 3 days    Minutes of Exercise per Session: 30 min  Stress: Stress Concern Present (12/09/2023)   [employer] of Occupational Health - Occupational Stress Questionnaire    Feeling of Stress : Rather much  Social Connections: Unknown (12/09/2023)   Social Connection and Isolation Panel [NHANES]    Frequency of Communication with Friends and Family: More than three times a week    Frequency of Social Gatherings with Friends and Family: More than three times a week    Attends Religious Services: 1 to 4 times per year    Active Member of [employer] or Organizations: Patient declined    Attends Banker Meetings: Not on file    Marital Status: Widowed  Catering manager Violence: Not on file    ROS Per HPI      Objective    BP 128/78   Pulse 66   Temp 98.5 F (36.9 C)   Ht 4\' 11"  (1.499 m)   Wt 135 lb 3.2 oz (61.3 kg)   SpO2 96%   BMI 27.31 kg/m   Physical Exam Vitals and nursing note reviewed.  Constitutional:      General: She is not in acute  distress.    Appearance: Normal appearance. She is normal weight.  HENT:     Head: Normocephalic and atraumatic.     Right Ear: External ear normal.     Left Ear: External ear normal.     Nose: Nose normal.     Mouth/Throat:     Mouth: Mucous membranes are moist.     Pharynx: Oropharynx is clear.  Eyes:  Extraocular Movements: Extraocular movements intact.     Pupils: Pupils are equal, round, and reactive to light.  Cardiovascular:     Rate and Rhythm: Normal rate and regular rhythm.     Pulses: Normal pulses.     Heart sounds: Normal heart sounds.  Pulmonary:     Effort: Pulmonary effort is normal. No respiratory distress.     Breath sounds: Normal breath sounds. No wheezing, rhonchi or rales.  Musculoskeletal:        General: Normal range of motion.     Cervical back: Normal range of motion.     Right lower leg: No edema.     Left lower leg: No edema.  Lymphadenopathy:     Cervical: No cervical adenopathy.  Neurological:     General: No focal deficit present.     Mental Status: She is alert and oriented to person, place, and time.  Psychiatric:        Mood and Affect: Mood normal.        Thought Content: Thought content normal.         Assessment & Plan:   Primary hypertension Assessment & Plan: Stable, continue mgt with diet and exercise   Hypothyroidism due to Hashimoto's thyroiditis Assessment & Plan: Continue Armour Thyroid  90mg  once daily   Osteoporosis, unspecified osteoporosis type, unspecified pathological fracture presence Assessment & Plan: DEXA due 12/25, cont vit d supplementation, weight bearing exercise   CKD (chronic kidney disease) stage 2, GFR 60-89 ml/min Assessment & Plan: Stable, last GFR 91 Discussed maintaining hydration   Vitamin D  deficiency Assessment & Plan: Levels at next visit, cont vit D   Moderate episode of recurrent major depressive disorder (HCC) Assessment & Plan: Stable, Continue to follow with psych and have  them manage medications   Hyperlipidemia, unspecified hyperlipidemia type Assessment & Plan: Levels at next visit, continue omega 3s Continue low fat diet with healthy activity level   Anxiety Assessment & Plan: Stable, cont to follow w/ psych   Other abnormal glucose Assessment & Plan: Stable, A1c at next visit   Medication management     No follow-ups on file.   Wellington Half, FNP

## 2023-12-13 NOTE — Patient Instructions (Addendum)
 Welcome to [facility]!  Thank you for choosing us  for your Primary Care needs.   We offer in person and video appointments for your convenience.   You may call our office to schedule appointments, or you may schedule appointments with me through MyChart.   The best way to get in contact with me is via MyChart message.   This will get to me faster than a phone call, unless there is an emergency, then please call 911.  The lab is located downstairs in the Sports Medicine building, we also have xray available there.   Follow-up with me in 3 mos for medication management, sooner if needed.  Espiridion Heft, therapist  [phone]

## 2023-12-15 ENCOUNTER — Encounter: Payer: Self-pay | Admitting: Family Medicine

## 2023-12-15 NOTE — Assessment & Plan Note (Signed)
 Stable, continue mgt with diet and exercise

## 2023-12-15 NOTE — Assessment & Plan Note (Signed)
 Stable, last GFR 91 Discussed maintaining hydration

## 2023-12-15 NOTE — Assessment & Plan Note (Signed)
 Continue Armour Thyroid  90mg  once daily

## 2023-12-15 NOTE — Assessment & Plan Note (Signed)
 Stable, A1c at next visit

## 2023-12-15 NOTE — Assessment & Plan Note (Signed)
 Stable, cont to follow w/ psych

## 2023-12-15 NOTE — Assessment & Plan Note (Signed)
 Levels at next visit, cont vit D

## 2023-12-15 NOTE — Assessment & Plan Note (Signed)
 DEXA due 12/25, cont vit d supplementation, weight bearing exercise

## 2023-12-15 NOTE — Assessment & Plan Note (Signed)
 Levels at next visit, continue omega 3s Continue low fat diet with healthy activity level

## 2023-12-15 NOTE — Assessment & Plan Note (Signed)
 Stable, Continue to follow with psych and have them manage medications

## 2024-01-18 ENCOUNTER — Ambulatory Visit: Payer: Medicare Other | Admitting: Nurse Practitioner

## 2024-02-02 ENCOUNTER — Ambulatory Visit: Payer: Medicare Other | Admitting: Nurse Practitioner

## 2024-02-02 ENCOUNTER — Ambulatory Visit: Admitting: Podiatry

## 2024-02-02 ENCOUNTER — Encounter: Payer: Self-pay | Admitting: Podiatry

## 2024-02-02 DIAGNOSIS — M2041 Other hammer toe(s) (acquired), right foot: Secondary | ICD-10-CM

## 2024-02-02 DIAGNOSIS — Q828 Other specified congenital malformations of skin: Secondary | ICD-10-CM

## 2024-02-02 DIAGNOSIS — M21622 Bunionette of left foot: Secondary | ICD-10-CM | POA: Diagnosis not present

## 2024-02-02 NOTE — Progress Notes (Signed)
 Subjective:   Patient ID: Michelle Miranda, female   DOB: 73 y.o.   MRN: [id number]   HPI Patient presents with severe lesion formation plantar aspect left foot along with moderate bone deformity fifth metatarsal left and elevated third digit right foot which she tries to wear extensions for her to keep it under control.   ROS      Objective:  Physical Exam  Neurovascular status intact with severe lesion subfifth metatarsal left subfourth metatarsal left with elevated third digit left and prominence of the fifth metatarsal head left     Assessment:  Inflammatory condition with tailor's bunion deformity left hammertoe deformity third right and porokeratotic lesions left     Plan:  H&P all conditions reviewed and sharp sterile debridement of lesions left courtesy discussed tailor's bunion and we could always do a met head resection if necessary and discussed possible digital procedure third right possible tenotomy

## 2024-02-09 ENCOUNTER — Ambulatory Visit: Admitting: Podiatry

## 2024-02-23 ENCOUNTER — Ambulatory Visit

## 2024-02-23 VITALS — Ht 59.0 in | Wt 135.0 lb

## 2024-02-23 DIAGNOSIS — Z0000 Encounter for general adult medical examination without abnormal findings: Secondary | ICD-10-CM

## 2024-02-23 NOTE — Patient Instructions (Addendum)
 Michelle Miranda , Thank you for taking time out of your busy schedule to complete your Annual Wellness Visit with me. I enjoyed our conversation and look forward to speaking with you again next year. I, as well as your care team,  appreciate your ongoing commitment to your health goals. Please review the following plan we discussed and let me know if I can assist you in the future. Your Game plan/ To Do List     Follow up Visits: Next Medicare AWV with our clinical staff: 03/07/2025 - in the office   Have you seen your provider in the last 6 months (3 months if uncontrolled diabetes)? Yes Next Office Visit with your provider: 03/27/2024  Clinician Recommendations:  Aim for 30 minutes of exercise or brisk walking, 6-8 glasses of water, and 5 servings of fruits and vegetables each day. Educated and advised on getting the Shingles vaccines in 2025.      This is a list of the screening recommended for you and due dates:  Health Maintenance  Topic Date Due   Zoster (Shingles) Vaccine (1 of 2) Never done   COVID-19 Vaccine (5 - 2024-25 season) 04/03/2023   Flu Shot  03/02/2024   Mammogram  10/19/2024   Medicare Annual Wellness Visit  02/22/2025   DEXA scan (bone density measurement)  06/13/2025   DTaP/Tdap/Td vaccine (3 - Td or Tdap) 03/14/2028   Colon Cancer Screening  01/02/2029   Pneumococcal Vaccine for age over 1  Completed   Hepatitis C Screening  Completed   Hepatitis B Vaccine  Aged Out   HPV Vaccine  Aged Out   Meningitis B Vaccine  Aged Out    Advanced directives: (Provided) Advance directive discussed with you today. I have provided a copy for you to complete at home and have notarized. Once this is complete, please bring a copy in to our office so we can scan it into your chart.  Advance Care Planning is important because it:  [x]  Makes sure you receive the medical care that is consistent with your values, goals, and preferences  [x]  It provides guidance to your family and loved  ones and reduces their decisional burden about whether or not they are making the right decisions based on your wishes.  Follow the link provided in your after visit summary or read over the paperwork we have mailed to you to help you started getting your Advance Directives in place. If you need assistance in completing these, please reach out to us  so that we can help you!

## 2024-02-23 NOTE — Progress Notes (Addendum)
 Subjective:   Michelle Miranda is a 73 y.o. who presents for a Medicare Wellness preventive visit.  As a reminder, Annual Wellness Visits don't include a physical exam, and some assessments may be limited, especially if this visit is performed virtually. We may recommend an in-person follow-up visit with your provider if needed.  Visit Complete: Virtual I connected with  ZAINAB CRUMRINE on 02/23/24 by a audio enabled telemedicine application and verified that I am speaking with the correct person using two identifiers.  Patient Location: Home  Provider Location: Office/Clinic  I discussed the limitations of evaluation and management by telemedicine. The patient expressed understanding and agreed to proceed.  Vital Signs: Because this visit was a virtual/telehealth visit, some criteria may be missing or patient reported. Any vitals not documented were not able to be obtained and vitals that have been documented are patient reported.  VideoDeclined- This patient declined Librarian, academic. Therefore the visit was completed with audio only.  Persons Participating in Visit: Patient.  AWV Questionnaire: Yes: Patient Medicare AWV questionnaire was completed by the patient on 02/22/2024; I have confirmed that all information answered by patient is correct and no changes since this date.  Cardiac Risk Factors include: advanced age (>73men, >28 women);dyslipidemia;hypertension     Objective:    Today's Vitals   02/23/24 1339  Weight: 135 lb (61.2 kg)  Height: 4' 11 (1.499 m)   Body mass index is 27.27 kg/m.     02/23/2024    1:40 PM 01/18/2023    3:19 PM 03/15/2022    7:24 AM 11/19/2020    3:05 PM 10/17/2019    3:35 PM 10/04/2018    4:01 PM 09/11/2012   11:08 AM  Advanced Directives  Does Patient Have a Medical Advance Directive? No Yes No No Yes Yes  Patient has advance directive, copy not in chart   Type of Advance Directive  Living will   Healthcare Power of  [place];Living will Healthcare Power of [place];Living will   Does patient want to make changes to medical advance directive?     No - Patient declined No - Patient declined    Copy of Healthcare Power of Attorney in Chart?     No - copy requested No - copy requested    Would patient like information on creating a medical advance directive? Yes (MAU/Ambulatory/Procedural Areas - Information given)   No - Patient declined     Pre-existing out of facility DNR order (yellow form or pink MOST form)       No      Data saved with a previous flowsheet row definition    Current Medications (verified) Outpatient Encounter Medications as of 02/23/2024  Medication Sig   ARMOUR THYROID  90 MG tablet Take 0.5 tablets (45 mg total) by mouth daily.   B Complex-C (B-COMPLEX WITH VITAMIN C) tablet Take 1 tablet by mouth daily.   MAG THREONATE-NIACINAMIDE ER PO Take 400 mg by mouth daily.   MAGNESIUM GLUCONATE PO Take 400 mg by mouth.   Multiple Vitamin (MULTIVITAMIN ADULT PO) Take by mouth.   Omega-3 Fatty Acids (FISH OIL OMEGA-3 PO) Take 1,200 mg by mouth.   VITAMIN D -VITAMIN K PO Take 1 tablet by mouth daily. Vit D 5000 Iu +K2 100 mcg    No facility-administered encounter medications on file as of 02/23/2024.    Allergies (verified) Oxycodone , Dexamethasone , and Other   History: Past Medical History:  Diagnosis Date   Altered mental status 05/28/2020  Anxiety    occasional   Arthritis    Depression    Hyperlipidemia    Hypertension    Hypothyroid    hypo thyroid    Osteopenia    Urinary tract infection 12/15/2011   once   Vitamin D  deficiency    Past Surgical History:  Procedure Laterality Date   c sections  jan 1980 and march 1984   CATARACT EXTRACTION, BILATERAL Bilateral 2016   Dr. Camillo   COLONOSCOPY     ovarian cyst with lararoscopy march 1998  1998   TOTAL KNEE ARTHROPLASTY  01/03/2012   Procedure: TOTAL KNEE ARTHROPLASTY;  Surgeon: Dempsey LULLA Moan, MD;  Location: [facility];   Service: Orthopedics;  Laterality: Right;   TOTAL KNEE ARTHROPLASTY Left 09/11/2012   Procedure: Left Total Knee Arthroplasty;  Surgeon: Dempsey LULLA Moan, MD;  Location: [facility];  Service: Orthopedics;  Laterality: Left;   WISDOM TOOTH EXTRACTION  09-05-12   extractions   Family History  Problem Relation Age of Onset   Hypertension Mother    Thyroid  disease Mother    Arthritis Mother    Cancer Mother        gallbladder cancer   COPD Father    Breast cancer Maternal Grandmother        84s   Stroke Maternal Grandmother 7   Breast cancer Paternal Grandmother    Colon cancer Neg Hx    Esophageal cancer Neg Hx    Stomach cancer Neg Hx    Rectal cancer Neg Hx    Social History   Socioeconomic History   Marital status: Widowed    Spouse name: Not on file   Number of children: 2   Years of education: Not on file   Highest education level: Some college, no degree  Occupational History   Not on file  Tobacco Use   Smoking status: Never   Smokeless tobacco: Never  Vaping Use   Vaping status: Never Used  Substance and Sexual Activity   Alcohol use: Not Currently    Alcohol/week: 0.0 standard drinks of alcohol    Comment: formerly 1-2 glasses of wine per night   Drug use: No   Sexual activity: Not Currently    Partners: Male    Birth control/protection: Post-menopausal  Other Topics Concern   Not on file  Social History Narrative   Lives with daughter and grandaughter   Right handed   Drinks caffeine rarely   Social Drivers of Health   Financial Resource Strain: Low Risk  (02/23/2024)   Overall Financial Resource Strain (CARDIA)    Difficulty of Paying Living Expenses: Not very hard  Food Insecurity: No Food Insecurity (02/23/2024)   Hunger Vital Sign    Worried About Running Out of Food in the Last Year: Never true    Ran Out of Food in the Last Year: Never true  Transportation Needs: No Transportation Needs (02/23/2024)   PRAPARE - Scientist, research (physical sciences) (Medical): No    Lack of Transportation (Non-Medical): No  Physical Activity: Insufficiently Active (02/23/2024)   Exercise Vital Sign    Days of Exercise per Week: 4 days    Minutes of Exercise per Session: 30 min  Stress: Stress Concern Present (02/23/2024)   [employer] of Occupational Health - Occupational Stress Questionnaire    Feeling of Stress: To some extent  Social Connections: Moderately Integrated (02/23/2024)   Social Connection and Isolation Panel    Frequency of Communication with Friends and Family:  More than three times a week    Frequency of Social Gatherings with Friends and Family: More than three times a week    Attends Religious Services: 1 to 4 times per year    Active Member of [employer] or Organizations: Yes    Attends Banker Meetings: More than 4 times per year    Marital Status: Widowed    Tobacco Counseling Counseling given: No    Clinical Intake:  Pre-visit preparation completed: Yes  Pain : No/denies pain     BMI - recorded: 27.27 Nutritional Status: BMI 25 -29 Overweight Nutritional Risks: None Diabetes: No  Lab Results  Component Value Date   HGBA1C 5.7 (H) 08/18/2023   HGBA1C 5.7 (H) 04/21/2023   HGBA1C 5.8 (H) 01/18/2023     How often do you need to have someone help you when you read instructions, pamphlets, or other written materials from your doctor or pharmacy?: 1 - Never  Interpreter Needed?: No  Information entered by :: Verdie Saba, CMA   Activities of Daily Living     02/23/2024    1:43 PM 02/22/2024   12:53 PM  In your present state of health, do you have any difficulty performing the following activities:  Hearing? 0 0  Vision? 0 0  Difficulty concentrating or making decisions? 0 0  Walking or climbing stairs? 0 0  Dressing or bathing? 0 0  Doing errands, shopping? 0 0  Preparing Food and eating ? N N  Using the Toilet? N N  In the past six months, have you accidently leaked urine?  N N  Do you have problems with loss of bowel control? N N  Managing your Medications? N N  Managing your Finances? N N  Housekeeping or managing your Housekeeping? N N    Patient Care Team: Alvia Corean CROME, FNP as PCP - General (Family Medicine) Melodi Lerner, MD as Consulting Physician (Orthopedic Surgery) Camillo Golas, MD as Consulting Physician (Ophthalmology) Abran Norleen SAILOR, MD as Consulting Physician (Gastroenterology) Trixie File, MD as Consulting Physician (Internal Medicine)  I have updated your Care Teams any recent Medical Services you may have received from other providers in the past year.     Assessment:   This is a routine wellness examination for Cia.  Hearing/Vision screen Vision Screening - Comments:: Wears eyeglasses for reading only - up to date with routine [facility] exams with [facility]   Goals Addressed               This Visit's Progress     Patient Stated (pt-stated)        Patient stated she plans to manage her depression/anxiety due to tapering off medication.         Depression Screen     02/23/2024    1:44 PM 04/22/2023   12:24 AM 01/18/2023    3:44 PM 01/18/2023    3:19 PM 03/15/2022    7:24 AM 09/03/2021    2:19 PM 11/19/2020    3:13 PM  PHQ 2/9 Scores  PHQ - 2 Score 0 0 1 1 1 3 3   PHQ- 9 Score 0     4 5    Fall Risk     02/23/2024    1:43 PM 02/22/2024   12:53 PM 08/17/2023    9:42 PM 04/22/2023   12:24 AM 01/18/2023    3:19 PM  Fall Risk   Falls in the past year? 0 0 0 0 0  Number falls  in past yr: 0 0     Injury with Fall? 0 0     Risk for fall due to : No Fall Risks  No Fall Risks No Fall Risks   Follow up Falls evaluation completed;Falls prevention discussed   Falls prevention discussed;Education provided;Falls evaluation completed     MEDICARE RISK AT HOME:  Medicare Risk at Home Any stairs in or around the home?: Yes If so, are there any without handrails?: No Home free of loose throw rugs in walkways, pet  beds, electrical cords, etc?: Yes Adequate lighting in your home to reduce risk of falls?: Yes Life alert?: No Use of a cane, walker or w/c?: No Grab bars in the bathroom?: No Shower chair or bench in shower?: No Elevated toilet seat or a handicapped toilet?: No  TIMED UP AND GO:  Was the test performed?  No  Cognitive Function: 6CIT completed        02/23/2024    1:46 PM  6CIT Screen  What Year? 0 points  What month? 0 points  What time? 0 points  Count back from 20 0 points  Months in reverse 0 points  Repeat phrase 0 points  Total Score 0 points    Immunizations Immunization History  Administered Date(s) Administered   Fluad Quad(high Dose 65+) 04/12/2019   Influenza Inj Mdck Quad Pf 05/23/2017   Influenza Split 05/10/2014, 05/19/2015   Influenza, High Dose Seasonal PF 07/02/2018, 08/14/2018, 06/09/2021, 04/21/2023   Influenza,inj,Quad PF,6+ Mos 03/23/2016   Influenza-Unspecified 04/12/2019, 07/07/2020   PFIZER(Purple Top)SARS-COV-2 Vaccination 09/13/2019, 10/08/2019, 07/07/2020, 02/25/2021   Pneumococcal Conjugate-13 07/09/2019   Pneumococcal Polysaccharide-23 03/14/2018   Td 06/02/2006   Tdap 03/14/2018    Screening Tests Health Maintenance  Topic Date Due   Zoster Vaccines- Shingrix (1 of 2) Never done   COVID-19 Vaccine (5 - 2024-25 season) 04/03/2023   INFLUENZA VACCINE  03/02/2024   MAMMOGRAM  10/19/2024   Medicare Annual Wellness (AWV)  02/22/2025   DEXA SCAN  06/13/2025   DTaP/Tdap/Td (3 - Td or Tdap) 03/14/2028   Colonoscopy  01/02/2029   Pneumococcal Vaccine: 50+ Years  Completed   Hepatitis C Screening  Completed   Hepatitis B Vaccines  Aged Out   HPV VACCINES  Aged Out   Meningococcal B Vaccine  Aged Out    Health Maintenance  Health Maintenance Due  Topic Date Due   Zoster Vaccines- Shingrix (1 of 2) Never done   COVID-19 Vaccine (5 - 2024-25 season) 04/03/2023   Health Maintenance Items Addressed:02/23/2024  I have recommended that  this patient have a immunization for Shingles but she declines at this time. I have discussed the risks and benefits of this procedure with her. The patient verbalizes understanding.  Additional Screening:  Vision Screening: Recommended annual ophthalmology exams for early detection of glaucoma and other disorders of the eye. Would you like a referral to an eye doctor? No    Dental Screening: Recommended annual dental exams for proper oral hygiene  Community Resource Referral / Chronic Care Management: CRR required this visit?  No   CCM required this visit?  No   Plan:    I have personally reviewed and noted the following in the patient's chart:   Medical and social history Use of alcohol, tobacco or illicit drugs  Current medications and supplements including opioid prescriptions. Patient is not currently taking opioid prescriptions. Functional ability and status Nutritional status Physical activity Advanced directives List of other physicians Hospitalizations, surgeries, and ER visits  in previous 12 months Vitals Screenings to include cognitive, depression, and falls Referrals and appointments  In addition, I have reviewed and discussed with patient certain preventive protocols, quality metrics, and best practice recommendations. A written personalized care plan for preventive services as well as general preventive health recommendations were provided to patient.   Verdie CHRISTELLA Saba, CMA   02/23/2024   After Visit Summary: (MyChart) Due to this being a telephonic visit, the after visit summary with patients personalized plan was offered to patient via MyChart   Notes: Nothing significant to report at this time.

## 2024-03-27 ENCOUNTER — Encounter: Payer: Self-pay | Admitting: Family Medicine

## 2024-03-27 ENCOUNTER — Ambulatory Visit (INDEPENDENT_AMBULATORY_CARE_PROVIDER_SITE_OTHER): Admitting: Family Medicine

## 2024-03-27 ENCOUNTER — Ambulatory Visit: Payer: Self-pay | Admitting: Family Medicine

## 2024-03-27 VITALS — BP 126/78 | HR 72 | Temp 98.1°F | Ht 59.0 in | Wt 133.2 lb

## 2024-03-27 DIAGNOSIS — E559 Vitamin D deficiency, unspecified: Secondary | ICD-10-CM | POA: Diagnosis not present

## 2024-03-27 DIAGNOSIS — Z1589 Genetic susceptibility to other disease: Secondary | ICD-10-CM

## 2024-03-27 DIAGNOSIS — F411 Generalized anxiety disorder: Secondary | ICD-10-CM | POA: Diagnosis not present

## 2024-03-27 DIAGNOSIS — M810 Age-related osteoporosis without current pathological fracture: Secondary | ICD-10-CM | POA: Diagnosis not present

## 2024-03-27 DIAGNOSIS — I10 Essential (primary) hypertension: Secondary | ICD-10-CM

## 2024-03-27 DIAGNOSIS — Z79899 Other long term (current) drug therapy: Secondary | ICD-10-CM

## 2024-03-27 DIAGNOSIS — E063 Autoimmune thyroiditis: Secondary | ICD-10-CM

## 2024-03-27 DIAGNOSIS — N182 Chronic kidney disease, stage 2 (mild): Secondary | ICD-10-CM

## 2024-03-27 DIAGNOSIS — R7303 Prediabetes: Secondary | ICD-10-CM

## 2024-03-27 DIAGNOSIS — L304 Erythema intertrigo: Secondary | ICD-10-CM

## 2024-03-27 DIAGNOSIS — E785 Hyperlipidemia, unspecified: Secondary | ICD-10-CM

## 2024-03-27 DIAGNOSIS — F331 Major depressive disorder, recurrent, moderate: Secondary | ICD-10-CM | POA: Diagnosis not present

## 2024-03-27 DIAGNOSIS — F419 Anxiety disorder, unspecified: Secondary | ICD-10-CM

## 2024-03-27 DIAGNOSIS — Z23 Encounter for immunization: Secondary | ICD-10-CM

## 2024-03-27 LAB — LIPID PANEL
Cholesterol: 191 mg/dL (ref 0–200)
HDL: 93.2 mg/dL (ref >39.00)
LDL Cholesterol: 86 mg/dL (ref 0–99)
NonHDL: 98.12
Total CHOL/HDL Ratio: 2
Triglycerides: 59 mg/dL (ref 0.0–149.0)
VLDL: 11.8 mg/dL (ref 0.0–40.0)

## 2024-03-27 LAB — CBC WITH DIFFERENTIAL/PLATELET
Basophils Absolute: 0 K/uL (ref 0.0–0.1)
Basophils Relative: 0.3 % (ref 0.0–3.0)
Eosinophils Absolute: 0.1 K/uL (ref 0.0–0.7)
Eosinophils Relative: 1.3 % (ref 0.0–5.0)
HCT: 41.4 % (ref 36.0–46.0)
Hemoglobin: 13.7 g/dL (ref 12.0–15.0)
Lymphocytes Relative: 14.5 % (ref 12.0–46.0)
Lymphs Abs: 1.4 K/uL (ref 0.7–4.0)
MCHC: 33.2 g/dL (ref 30.0–36.0)
MCV: 91.6 fl (ref 78.0–100.0)
Monocytes Absolute: 0.8 K/uL (ref 0.1–1.0)
Monocytes Relative: 8.1 % (ref 3.0–12.0)
Neutro Abs: 7 K/uL (ref 1.4–7.7)
Neutrophils Relative %: 75.8 % (ref 43.0–77.0)
Platelets: 285 K/uL (ref 150.0–400.0)
RBC: 4.52 Mil/uL (ref 3.87–5.11)
RDW: 13.3 % (ref 11.5–15.5)
WBC: 9.3 K/uL (ref 4.0–10.5)

## 2024-03-27 LAB — COMPREHENSIVE METABOLIC PANEL WITH GFR
ALT: 19 U/L (ref 0–35)
AST: 21 U/L (ref 0–37)
Albumin: 4.3 g/dL (ref 3.5–5.2)
Alkaline Phosphatase: 82 U/L (ref 39–117)
BUN: 9 mg/dL (ref 6–23)
CO2: 28 meq/L (ref 19–32)
Calcium: 8.9 mg/dL (ref 8.4–10.5)
Chloride: 103 meq/L (ref 96–112)
Creatinine, Ser: 0.7 mg/dL (ref 0.40–1.20)
GFR: 87.11 mL/min (ref >60.00)
Glucose, Bld: 111 mg/dL — ABNORMAL HIGH (ref 70–99)
Potassium: 3.9 meq/L (ref 3.5–5.1)
Sodium: 140 meq/L (ref 135–145)
Total Bilirubin: 0.6 mg/dL (ref 0.2–1.2)
Total Protein: 7.7 g/dL (ref 6.0–8.3)

## 2024-03-27 LAB — VITAMIN D 25 HYDROXY (VIT D DEFICIENCY, FRACTURES): VITD: 65.09 ng/mL (ref 30.00–100.00)

## 2024-03-27 LAB — HEMOGLOBIN A1C: Hgb A1c MFr Bld: 6 % (ref 4.6–6.5)

## 2024-03-27 NOTE — Progress Notes (Signed)
 Established Patient Office Visit  Subjective:     Patient ID: Michelle Miranda, female    DOB: 01/16/1951, 73 y.o.   MRN: [id number]  Chief Complaint  Patient presents with   Follow-up    HPI  Discussed the use of AI scribe software for clinical note transcription with the patient, who gave verbal consent to proceed.  History of Present Illness Michelle Miranda is a 73 year old female who presents for a three-month follow-up.  MDD/GAD - Currently tapering lexapro  medication cautiously with psych provider after an unintended reduction of 19% instead of the intended 3% - Current lexapro  medication dose is 1.4 milliliters  Thyroid  Dysfunction - Feels better on Armour Thyroid  compared to Synthroid  or levothyroxine  - Requests comprehensive thyroid  panel including free T3, free T4, and reverse T3  Depressive symptoms and antidepressant management - History of depression - Previously discontinued Cymbalta  abruptly - Currently taking Lexapro  and considering tapering off completely - Genetic testing reveals one copy of the MTHFR gene, which affects medication metabolism - Exploring supplements as an alternative to antidepressants  Herpes zoster (shingles) - Experienced a mild case of shingles before age 72 - Was advised that the shingles vaccine was not needed  Intertrigo (skin irritation under abdomen) - Skin irritation present under the breast, attributed to skin folds - Manages irritation by keeping the area dry and applying witch hazel     ROS Per HPI      Objective:    BP 126/78 (BP Location: Right Arm, Patient Position: Sitting, Cuff Size: Normal)   Pulse 72   Temp 98.1 F (36.7 C) (Temporal)   Ht 4' 11 (1.499 m)   Wt 133 lb 3.2 oz (60.4 kg)   SpO2 96%   BMI 26.90 kg/m    Physical Exam Vitals and nursing note reviewed.  Constitutional:      General: She is not in acute distress.    Appearance: Normal appearance.  HENT:     Head: Normocephalic and  atraumatic.     Right Ear: External ear normal.     Left Ear: External ear normal.     Nose: Nose normal.     Mouth/Throat:     Mouth: Mucous membranes are moist.     Pharynx: Oropharynx is clear.  Eyes:     Extraocular Movements: Extraocular movements intact.     Pupils: Pupils are equal, round, and reactive to light.  Cardiovascular:     Rate and Rhythm: Normal rate and regular rhythm.     Pulses: Normal pulses.     Heart sounds: Normal heart sounds.  Pulmonary:     Effort: Pulmonary effort is normal. No respiratory distress.     Breath sounds: Normal breath sounds. No wheezing, rhonchi or rales.  Musculoskeletal:        General: Normal range of motion.     Cervical back: Normal range of motion.     Right lower leg: No edema.     Left lower leg: No edema.  Lymphadenopathy:     Cervical: No cervical adenopathy.  Skin:    Findings: Rash present.     Comments: Shiny, flat erythematous rash to lower mid abdomen, inferior to skin fold. No discharge, no bleeding. Consistent with intertrigo  Neurological:     General: No focal deficit present.     Mental Status: She is alert and oriented to person, place, and time.  Psychiatric:        Mood and Affect: Mood normal.  Thought Content: Thought content normal.     Results for orders placed or performed in visit on 03/27/24  CBC with Differential/Platelet  Result Value Ref Range   WBC 9.3 4.0 - 10.5 K/uL   RBC 4.52 3.87 - 5.11 Mil/uL   Hemoglobin 13.7 12.0 - 15.0 g/dL   HCT 58.5 63.9 - 53.9 %   MCV 91.6 78.0 - 100.0 fl   MCHC 33.2 30.0 - 36.0 g/dL   RDW 86.6 88.4 - 84.4 %   Platelets 285.0 150.0 - 400.0 K/uL   Neutrophils Relative % 75.8 43.0 - 77.0 %   Lymphocytes Relative 14.5 12.0 - 46.0 %   Monocytes Relative 8.1 3.0 - 12.0 %   Eosinophils Relative 1.3 0.0 - 5.0 %   Basophils Relative 0.3 0.0 - 3.0 %   Neutro Abs 7.0 1.4 - 7.7 K/uL   Lymphs Abs 1.4 0.7 - 4.0 K/uL   Monocytes Absolute 0.8 0.1 - 1.0 K/uL    Eosinophils Absolute 0.1 0.0 - 0.7 K/uL   Basophils Absolute 0.0 0.0 - 0.1 K/uL  Comprehensive metabolic panel with GFR  Result Value Ref Range   Sodium 140 135 - 145 mEq/L   Potassium 3.9 3.5 - 5.1 mEq/L   Chloride 103 96 - 112 mEq/L   CO2 28 19 - 32 mEq/L   Glucose, Bld 111 (H) 70 - 99 mg/dL   BUN 9 6 - 23 mg/dL   Creatinine, Ser 9.29 0.40 - 1.20 mg/dL   Total Bilirubin 0.6 0.2 - 1.2 mg/dL   Alkaline Phosphatase 82 39 - 117 U/L   AST 21 0 - 37 U/L   ALT 19 0 - 35 U/L   Total Protein 7.7 6.0 - 8.3 g/dL   Albumin 4.3 3.5 - 5.2 g/dL   GFR 12.88 >39.99 mL/min   Calcium 8.9 8.4 - 10.5 mg/dL  Hemoglobin J8r  Result Value Ref Range   Hgb A1c MFr Bld 6.0 4.6 - 6.5 %  VITAMIN D  25 Hydroxy (Vit-D Deficiency, Fractures)  Result Value Ref Range   VITD 65.09 30.00 - 100.00 ng/mL  Thyroid  Panel With TSH  Result Value Ref Range   T3 Uptake 30 22 - 35 %   T4, Total 7.3 5.1 - 11.9 mcg/dL   Free Thyroxine Index 2.2 1.4 - 3.8   TSH 0.75 0.40 - 4.50 mIU/L  T3, reverse  Result Value Ref Range   T3, Reverse 15 8 - 25 ng/dL  Varicella zoster antibody, IgG  Result Value Ref Range   Varicella IgG 6.68 S/CO  Lipid panel  Result Value Ref Range   Cholesterol 191 0 - 200 mg/dL   Triglycerides 40.9 0.0 - 149.0 mg/dL   HDL 06.79 >60.99 mg/dL   VLDL 88.1 0.0 - 59.9 mg/dL   LDL Cholesterol 86 0 - 99 mg/dL   Total CHOL/HDL Ratio 2    NonHDL 98.12     The 10-year ASCVD risk score (Arnett DK, et al., 2019) is: 9.4%  BP Readings from Last 3 Encounters:  03/27/24 126/78  12/13/23 128/78  08/18/23 138/78   Wt Readings from Last 3 Encounters:  03/27/24 133 lb 3.2 oz (60.4 kg)  02/23/24 135 lb (61.2 kg)  12/13/23 135 lb 3.2 oz (61.3 kg)      Last CBC Lab Results  Component Value Date   WBC 9.3 03/27/2024   HGB 13.7 03/27/2024   HCT 41.4 03/27/2024   MCV 91.6 03/27/2024   MCH 29.5 08/18/2023   RDW 13.3 03/27/2024  PLT 285.0 03/27/2024   Last metabolic panel Lab Results  Component  Value Date   GLUCOSE 111 (H) 03/27/2024   NA 140 03/27/2024   K 3.9 03/27/2024   CL 103 03/27/2024   CO2 28 03/27/2024   BUN 9 03/27/2024   CREATININE 0.70 03/27/2024   EGFR 93 08/18/2023   CALCIUM 8.9 03/27/2024   PROT 7.7 03/27/2024   ALBUMIN 4.3 03/27/2024   BILITOT 0.6 03/27/2024   ALKPHOS 82 03/27/2024   AST 21 03/27/2024   ALT 19 03/27/2024   ANIONGAP 8 05/21/2020   Last lipids Lab Results  Component Value Date   CHOL 191 03/27/2024   HDL 93.20 03/27/2024   LDLCALC 86 03/27/2024   TRIG 59.0 03/27/2024   CHOLHDL 2 03/27/2024   Last hemoglobin A1c Lab Results  Component Value Date   HGBA1C 6.0 03/27/2024   Last thyroid  functions Lab Results  Component Value Date   TSH 0.75 03/27/2024   T4TOTAL 7.3 03/27/2024   THYROIDAB 9 (H) 04/09/2016   Last vitamin D  Lab Results  Component Value Date   VD25OH 65.09 03/27/2024   Last vitamin B12 and Folate Lab Results  Component Value Date   VITAMINB12 765 09/03/2021         Assessment & Plan:   Assessment and Plan Assessment & Plan GAD/MDD, moderate, recurrent Currently tapering off Lexapro . Considering lithium orotate but hesitant. Improved stress management without medication. Aims to discontinue antidepressants, focusing on thyroid  level management. - Continue tapering Lexapro . - Discuss potential use of lithium orotate with more information.  Hypothyroidism Significant improvement on Armour thyroid . Interested in comprehensive thyroid  panel. Considering thyroid  function's impact on mood disorders. - Order comprehensive thyroid  panel including TSH, free T3, free T4, and reverse T3. - Continue current dose of Armour thyroid . - Monitor thyroid  function and adjust treatment as necessary.  Intertrigo Skin fold irritation under the breast due to moisture and friction. Uses witch hazel. Interested in alternative treatments. Gentian violet discussed. - Recommend gentian violet for application to affected  area. - Advise on proper hygiene and drying of the area to prevent moisture accumulation.  HTN - controlled without medications, CBC, CMP today - Conitnue low salt diet and exercise regimen  HLD - lipids today - has been at goal with Omega 3 supplementation, diet and exercise  Vitamin D  Deficiency/Osteoporosis - Stable - Vit D level today - Continue supplementation, weight bearing exercise  CKD stg II/Prediabetes - CMP, A1c today - stable - Continue current efforts in healthy diet and activity level  Heterozygous MTHFR mutation [id number] - Discussed Gene Sight testing results - Could affect metabolism of medications, could correlate with medication sensitivities  Due for Vaccines - HZV Titers ordered today - Recommended updated COVID and flu vaccines, will get these at the pharmacy, not covered in office  Medication Mgt - Will dose adjust per lab results as needed - Continue current regimen for now - Had AWV with  nurse health advisor on 02/23/24     Orders Placed This Encounter  Procedures   CBC with Differential/Platelet    Release to patient:   Immediate [1]   Comprehensive metabolic panel with GFR    Release to patient:   Immediate [1]   Hemoglobin A1c   VITAMIN D  25 Hydroxy (Vit-D Deficiency, Fractures)   Lipid panel    Standing Status:   Future    Number of Occurrences:   1    Expiration Date:   03/26/2025   Thyroid  Panel With TSH  T3, reverse   Varicella zoster antibody, IgG     No orders of the defined types were placed in this encounter.   No follow-ups on file.  Corean LITTIE Ku, FNP

## 2024-03-27 NOTE — Patient Instructions (Addendum)
 Gentian Violet to affected area as needed.  We are checking labs today, will be in contact with any results that require further attention  Continue lexapro  dosing with psych.  Recommend COVID and flu vaccines today.   Follow up with me in about 3 months for labs and medication management, sooner if needed.

## 2024-03-31 LAB — THYROID PANEL WITH TSH
Free Thyroxine Index: 2.2 (ref 1.4–3.8)
T3 Uptake: 30 % (ref 22–35)
T4, Total: 7.3 ug/dL (ref 5.1–11.9)
TSH: 0.75 m[IU]/L (ref 0.40–4.50)

## 2024-03-31 LAB — VARICELLA ZOSTER ANTIBODY, IGG: Varicella IgG: 6.68 {s_co_ratio}

## 2024-03-31 LAB — T3, REVERSE: T3, Reverse: 15 ng/dL (ref 8–25)

## 2024-04-03 ENCOUNTER — Ambulatory Visit (INDEPENDENT_AMBULATORY_CARE_PROVIDER_SITE_OTHER): Payer: Medicare Other | Admitting: Internal Medicine

## 2024-04-03 ENCOUNTER — Encounter: Payer: Self-pay | Admitting: Internal Medicine

## 2024-04-03 ENCOUNTER — Other Ambulatory Visit: Payer: Self-pay

## 2024-04-03 VITALS — BP 120/70 | HR 100 | Ht 59.0 in | Wt 132.2 lb

## 2024-04-03 DIAGNOSIS — E063 Autoimmune thyroiditis: Secondary | ICD-10-CM

## 2024-04-03 DIAGNOSIS — M810 Age-related osteoporosis without current pathological fracture: Secondary | ICD-10-CM

## 2024-04-03 DIAGNOSIS — L304 Erythema intertrigo: Secondary | ICD-10-CM | POA: Insufficient documentation

## 2024-04-03 DIAGNOSIS — Z23 Encounter for immunization: Secondary | ICD-10-CM | POA: Insufficient documentation

## 2024-04-03 MED ORDER — NYSTATIN 100000 UNIT/GM EX POWD: 1.0000 | Freq: Three times a day (TID) | CUTANEOUS | 1 refills | Status: DC

## 2024-04-03 NOTE — Patient Instructions (Addendum)
 Please continue Armour 45 mg daily, but move this to am.  Take the thyroid  hormone every day, with water, at least 30 minutes before breakfast, separated by at least 4 hours from: - acid reflux medications - calcium - iron  - multivitamins  Try to start weightbearing exercises - dumbbells, elastic bands.  Start Nystatin  powder 3x a day.  Please return in 1 year.

## 2024-04-03 NOTE — Progress Notes (Addendum)
 Patient ID: Michelle Miranda, female   DOB: 27-Oct-1950, 73 y.o.   MRN: [id number]   HPI  Michelle Miranda is a 73 y.o.-year-old female, initially referred by her PCP, Alan Coombs, NP (Dr. Tonita), returning for follow-up for Hashimoto's hypothyroidism and osteoporosis. Last visit 1 year ago.  Interim history: No falls or fractures since last visit. No dizziness/vertigo/orthostasis. She has a history of depression and also anxiety.  She was changed from Cymbalta  to Lexapro  due to breakthrough symptoms.  This did not work well for her.  Since last visit, she was in the emergency room with visual hallucinations. She now sees psychiatry and is tapering Lexapro  slowly to off. She continues to have hair loss. She had Covid x2 since last OV.  She also has a rash in the intertriginous areas: Under breasts and inguinal areas.  This did not improve with Lotrimin. She tells me that she recently had the reverse T3 checked by her insurance and this was elevated.  Hashimoto's hypothyroidism: Reviewed history: Pt. has been dx with hypothyroidism in 2012 and Hashimoto's thyroiditis in 04/2016 >> prev. On Levothyroxine , then on Synthroid  d.a.w. 37.5 mcg 6/7 and 75 mcg 1/7 (changed in 01/2016).  Latest thyroid  dose change was to 50 mcg daily and she has maintained normal TFTs afterwards.    We changed from levothyroxine  to Armour 01/2019.  She feels much better on Armour, with less fatigue.  She takes Armour 45 mg daily: - in am >> at 10 pm - at least 3-4h after dinner - no Ca, Fe,  PPIs - + MVI lunchtime She was previously on selenium 200 mcg daily, but now off.  Reviewed her TFTs: Lab Results  Component Value Date   TSH 0.75 03/27/2024   TSH 1.09 08/18/2023   TSH 1.07 04/21/2023   TSH 0.86 01/18/2023   TSH 1.22 09/23/2022   TSH 1.31 03/11/2022   TSH 1.11 09/03/2021   TSH 1.59 03/26/2021   TSH 1.86 10/27/2020   TSH 1.59 08/13/2020   FREET4 0.82 10/27/2020   FREET4 1.1 08/13/2020   FREET4 0.77  05/07/2020   FREET4 0.70 08/23/2019   FREET4 0.61 06/13/2019   FREET4 0.76 02/26/2019   FREET4 0.90 02/10/2018   FREET4 0.74 02/10/2017   FREET4 0.70 05/18/2016   FREET4 0.79 04/09/2016    She has a history of Hashimoto thyroiditis based on elevated TPO antibodies: Component     Latest Ref Rng & Units 04/09/2016  Thyroperoxidase Ab SerPl-aCnc     <9 IU/mL 9 (H)  Thyroglobulin Ab     <2 IU/mL <1   Pt denies: - feeling nodules in neck - hoarseness - dysphagia - choking  She has + FH of thyroid  disorders in: mother - hypothyroid. No FH of thyroid  cancer. No h/o radiation tx to head or neck. No recent steroids use.   Osteoporosis:  Pt was dx with OP in 08/2020.  I reviewed pt's DXA scans: Date Spine FN T score 33% distal Radius  [facility]14/[facility] (L1, L2) L3-L4: -1.5 (-2.6%) RFN: -2.3 LFN: -2.8 (-9.0% * for total mean hip) n/a  08/22/2020 ([facility]) (L1, L2) L3-L4: -1.3 (-7.1%*) RFN: -2.2 LFN: -2.6 n/a  07/21/2018 [facility]) n/a RFN: -2.0 LFN: -2.4 -1.4  07/18/2015 ([facility]) L1-L4 (L3): -1.6 RFN: -1.8 LFN: -2.2 n/a   She does have height loss.  Previous OP treatments:  - Evista  ~2005 >>  08/2020 - Fosamax  - suggested 09/2020, but not started  She does [employer] (  at [facility]) and using a vibration platform to help with osteoporosis.  No h/o vitamin D  deficiency. Reviewed available vit D levels: Lab Results  Component Value Date   VD25OH 65.09 03/27/2024   VD25OH 76 08/18/2023   VD25OH 72 04/21/2023   VD25OH 93 09/23/2022   VD25OH 75 03/11/2022   VD25OH 73 09/03/2021   VD25OH 70 03/26/2021   VD25OH 79 08/13/2020   VD25OH 68 02/11/2020   VD25OH 48 07/09/2019   Pt is on: - vitamin D  5000 units + vitamin K2  daily   She takes 1500 mg calcium from food.  She drinks Oatmilk.  No weight bearing exercises. Used to do Pilates >> stopped >> then once a week.  She is lifting her grandchildren.  She does  not take high vitamin A doses.  Menopause was at 73 y/o.   + FH of osteoporosis: In mother.  No h/o persistent hyper/hypocalcemia or hyperparathyroidism. No h/o kidney stones. Lab Results  Component Value Date   CALCIUM 8.9 03/27/2024   CALCIUM 9.2 08/18/2023   CALCIUM 9.7 04/21/2023   CALCIUM 9.6 01/18/2023   CALCIUM 9.0 09/23/2022   CALCIUM 9.3 03/11/2022   CALCIUM 9.2 09/03/2021   CALCIUM 9.1 03/26/2021   CALCIUM 9.3 11/19/2020   CALCIUM 9.2 08/13/2020   No h/o CKD. Last BUN/Cr: Lab Results  Component Value Date   BUN 9 03/27/2024   CREATININE 0.70 03/27/2024   She has a h/o serotonin sd. From Cymbalta  - 05/2020.  ROS: + see HPI + tremors when she is nervous  I reviewed pt's medications, allergies, PMH, social hx, family hx, and changes were documented in the history of present illness. Otherwise, unchanged from my initial visit note.  Past Medical History:  Diagnosis Date   Altered mental status 05/28/2020   Anxiety    occasional   Arthritis    Cataract    Depression    Hyperlipidemia    Hypertension    Hypothyroid    hypo thyroid    Osteopenia    Urinary tract infection 12/15/2011   once   Vitamin D  deficiency    Past Surgical History:  Procedure Laterality Date   c sections  jan 1980 and march 1984   CATARACT EXTRACTION, BILATERAL Bilateral 2016   Dr. Camillo   CESAREAN SECTION     COLONOSCOPY     EYE SURGERY     JOINT REPLACEMENT     ovarian cyst with lararoscopy march 1998  1998   TOTAL KNEE ARTHROPLASTY  01/03/2012   Procedure: TOTAL KNEE ARTHROPLASTY;  Surgeon: Dempsey LULLA Moan, MD;  Location: [facility];  Service: Orthopedics;  Laterality: Right;   TOTAL KNEE ARTHROPLASTY Left 09/11/2012   Procedure: Left Total Knee Arthroplasty;  Surgeon: Dempsey LULLA Moan, MD;  Location: [facility];  Service: Orthopedics;  Laterality: Left;   WISDOM TOOTH EXTRACTION  09/05/2012   extractions   Social History   Social History   Marital status: Married    Spouse  name: N/A   Number of children: 2   Occupational History   Administrator   Social History Main Topics   Smoking status: Never Smoker   Smokeless tobacco: Never Used   Alcohol use Yes     Comment: 1-2 glass wine night   Drug use: No   Current Outpatient Medications on File Prior to Visit  Medication Sig Dispense Refill   ARMOUR THYROID  90 MG tablet Take 0.5 tablets (45 mg total) by mouth daily. 45 tablet  3   B Complex-C (B-COMPLEX WITH VITAMIN C) tablet Take 1 tablet by mouth daily.     MAG THREONATE-NIACINAMIDE ER PO Take 400 mg by mouth daily.     MAGNESIUM GLUCONATE PO Take 400 mg by mouth.     Multiple Vitamin (MULTIVITAMIN ADULT PO) Take by mouth.     Omega-3 Fatty Acids (FISH OIL OMEGA-3 PO) Take 1,200 mg by mouth.     VITAMIN D -VITAMIN K PO Take 1 tablet by mouth daily. Vit D 5000 Iu +K2 100 mcg      No current facility-administered medications on file prior to visit.   Allergies  Allergen Reactions   Oxycodone      Found Oxycodone  not effective for pain control with last RTKA   Dexamethasone  Other (See Comments)    Hallucinations, Psychosis    Other     Opiods,causes severe anxiety   Family History  Problem Relation Age of Onset   Hypertension Mother    Thyroid  disease Mother    Arthritis Mother    Cancer Mother        gallbladder cancer   COPD Father    Breast cancer Maternal Grandmother        90s   Stroke Maternal Grandmother 31   Breast cancer Paternal Grandmother    Colon cancer Neg Hx    Esophageal cancer Neg Hx    Stomach cancer Neg Hx    Rectal cancer Neg Hx    PE: BP 120/70   Pulse 100   Ht 4' 11 (1.499 m)   Wt 132 lb 3.2 oz (60 kg)   SpO2 97%   BMI 26.70 kg/m  Wt Readings from Last 3 Encounters:  04/03/24 132 lb 3.2 oz (60 kg)  03/27/24 133 lb 3.2 oz (60.4 kg)  02/23/24 135 lb (61.2 kg)   Constitutional: normal weight, in NAD Eyes: EOMI, no exophthalmos ENT:no thyromegaly, no cervical lymphadenopathy Cardiovascular: R  tachycardia, RR, No MRG Respiratory: CTA B Musculoskeletal: no deformities Skin: + Erythematous rash under breasts and inguinal regions Neurological: + Chronic tremor with outstretched hands  ASSESSMENT: 1. Hypothyroidism 2/2 Hashimoto's thyroiditis  2.  Osteoporosis (OP)  3.  Intertrigo  PLAN:  1. Patient with longstanding Hashimoto's hypothyroidism, on Armour Thyroid  due to persistent fatigue on levothyroxine .  He feels better on Armour, with more energy. - latest thyroid  labs reviewed with pt. >> normal few days ago: Lab Results  Component Value Date   TSH 0.75 03/27/2024  - At last visit, she inquired about the utility of checking a reverse T3 level.  We discussed that this is mostly used for patients in the ICU, suspected for euthyroid sick syndrome.  However, at today's visit she mentions that she had a reverse T3 level checked after asking her insurance about this and it was elevated.  I advised her that this does not bring any new information in the setting of a normal TSH. - she continues on Armour thyroid  45 mg daily. - 2 years ago, she had longstanding anxiety, but exacerbated by her husband's passing.  She was worried that this was a sign of excess cortisol.  She had no other signs of Cushing syndrome: No weight gain, muscle weakness, diabetes, hypertension, so any investigation was not pursued at that time.  We did discuss that the T3 part of Armour could cause anxiety but she mentions that the anxiety started before taking the hormone in the morning. -At today's visit, pt feels good on the above Armour dose, but  continues to mention anxiety and hair loss - we discussed about taking the thyroid  hormone every day, with water, >30 minutes before breakfast, separated by >4 hours from acid reflux medications, calcium, iron , multivitamins. Pt. Is not taking it correctly - she move this at bedtime!  I advised her to move this back to the morning. - I will see her back in a year  2.  OP -Likely postmenopausal/age-related, possibly also due to low calcium intake.  She also has a family history of osteoporosis. -no history of fragility fractures.  She has a history of a toe fracture, but this does not qualify as an osteoporotic fracture - I previously recommended 1000 to 1200 mg of calcium daily preferentially from the diet. - I also recommended to continue 5000 units vitamin D  daily (which she takes along with a K2 vitamin).  Latest level was normal few days ago: Lab Results  Component Value Date   VD25OH 65.09 03/27/2024  -I also recommended weightbearing exercise at least 5 times a week.  At last visit and again today we discussed about the importance of doing weightbearing exercises and suggested use dumbbells or elastic bands. -She was previously on it based for a long time and we discussed that this was helping with osteoporosis.  She came off at this time in 2022.  At that time, we discussed about different osteoporosis medication classes, mechanism of action and possible side effects.  She was previously prescribed Fosamax , and she filled the prescription but did not start pending a new bone density. -Since last visit, she had another bone density scan 06/14/2023 which showed slightly decreased T-scores.  I recommended to start Fosamax .  However, she did not start. -At today's visit, we reviewed together her DXA scan and I explained that she is at high risk for fractures.  I advised her to think about the recommended agents: Fosamax , Reclast, Prolia and let me know if she wants to start these.  I explained the dosing intervals, administration route, and magnitude of benefit versus side effects -I will have her come back in 1 year  3.  Intertrigo - Using Lotrimin cream but this has not helped much - I sent a prescription for nystatin  powder - I advised her that if this is not improving, to schedule another appointment with PCP  Requested Prescriptions   Signed  Prescriptions Disp Refills   nystatin  (MYCOSTATIN /NYSTOP ) powder 15 g 1    Sig: Apply 1 Application topically 3 (three) times daily.   Lela Fendt, MD PhD [facility] Endocrinology

## 2024-04-05 ENCOUNTER — Other Ambulatory Visit

## 2024-04-05 DIAGNOSIS — E063 Autoimmune thyroiditis: Secondary | ICD-10-CM | POA: Diagnosis not present

## 2024-04-05 LAB — T3, FREE: T3, Free: 3.5 pg/mL (ref 2.3–4.2)

## 2024-04-16 ENCOUNTER — Encounter: Payer: Self-pay | Admitting: Family Medicine

## 2024-05-08 ENCOUNTER — Ambulatory Visit: Payer: Medicare Other | Admitting: Internal Medicine

## 2024-05-17 ENCOUNTER — Ambulatory Visit: Payer: Medicare Other | Admitting: Internal Medicine

## 2024-07-03 ENCOUNTER — Ambulatory Visit: Admitting: Family Medicine

## 2024-07-10 ENCOUNTER — Encounter: Payer: Self-pay | Admitting: Family Medicine

## 2024-07-10 ENCOUNTER — Ambulatory Visit: Admitting: Family Medicine

## 2024-07-10 VITALS — BP 132/82 | HR 78 | Temp 98.6°F | Ht 59.0 in | Wt 140.0 lb

## 2024-07-10 DIAGNOSIS — Z79899 Other long term (current) drug therapy: Secondary | ICD-10-CM

## 2024-07-10 DIAGNOSIS — F331 Major depressive disorder, recurrent, moderate: Secondary | ICD-10-CM

## 2024-07-10 DIAGNOSIS — E063 Autoimmune thyroiditis: Secondary | ICD-10-CM | POA: Diagnosis not present

## 2024-07-10 DIAGNOSIS — I10 Essential (primary) hypertension: Secondary | ICD-10-CM

## 2024-07-10 LAB — CBC WITH DIFFERENTIAL/PLATELET
Basophils Absolute: 0.1 K/uL (ref 0.0–0.1)
Basophils Relative: 0.9 % (ref 0.0–3.0)
Eosinophils Absolute: 0.1 K/uL (ref 0.0–0.7)
Eosinophils Relative: 1.5 % (ref 0.0–5.0)
HCT: 42.2 % (ref 36.0–46.0)
Hemoglobin: 13.9 g/dL (ref 12.0–15.0)
Lymphocytes Relative: 22.2 % (ref 12.0–46.0)
Lymphs Abs: 1.6 K/uL (ref 0.7–4.0)
MCHC: 33 g/dL (ref 30.0–36.0)
MCV: 91.9 fl (ref 78.0–100.0)
Monocytes Absolute: 0.6 K/uL (ref 0.1–1.0)
Monocytes Relative: 7.9 % (ref 3.0–12.0)
Neutro Abs: 4.8 K/uL (ref 1.4–7.7)
Neutrophils Relative %: 67.5 % (ref 43.0–77.0)
Platelets: 266 K/uL (ref 150.0–400.0)
RBC: 4.59 Mil/uL (ref 3.87–5.11)
RDW: 13.4 % (ref 11.5–15.5)
WBC: 7.1 K/uL (ref 4.0–10.5)

## 2024-07-10 LAB — COMPREHENSIVE METABOLIC PANEL WITH GFR
ALT: 23 U/L (ref 0–35)
AST: 26 U/L (ref 0–37)
Albumin: 4.5 g/dL (ref 3.5–5.2)
Alkaline Phosphatase: 84 U/L (ref 39–117)
BUN: 15 mg/dL (ref 6–23)
CO2: 28 meq/L (ref 19–32)
Calcium: 9.4 mg/dL (ref 8.4–10.5)
Chloride: 102 meq/L (ref 96–112)
Creatinine, Ser: 0.67 mg/dL (ref 0.40–1.20)
GFR: 87.85 mL/min (ref >60.00)
Glucose, Bld: 102 mg/dL — ABNORMAL HIGH (ref 70–99)
Potassium: 4.1 meq/L (ref 3.5–5.1)
Sodium: 140 meq/L (ref 135–145)
Total Bilirubin: 0.6 mg/dL (ref 0.2–1.2)
Total Protein: 7.3 g/dL (ref 6.0–8.3)

## 2024-07-10 LAB — T3, FREE: T3, Free: 3.5 pg/mL (ref 2.3–4.2)

## 2024-07-10 NOTE — Progress Notes (Signed)
 Established Patient Office Visit  Subjective:     Patient ID: Michelle Miranda, female    DOB: 1951-07-20, 73 y.o.   MRN: [id number]  Chief Complaint  Patient presents with   Follow-up    2m f/u    HPI  Discussed the use of AI scribe software for clinical note transcription with the patient, who gave verbal consent to proceed.  History of Present Illness Michelle Miranda is a 73 year old female who presents for medication management and follow-up.  Thyroid  function and hormone therapy - Feels better on Armour Thyroid  compared to prior use of Synthroid /levothyroxine . - Concerned that thyroid  function may not be optimal despite normal laboratory results per endocrinologist.  Mood disturbance and antidepressant taper - Mood has worsened due to holiday and family stress, feeling less 'perky' than usual. - Tapering off liquid Lexapro , currently at 1.2 mL. - Proceeding with slow taper due to high sensitivity to medications. - Tolerating Lexapro  taper without adverse effects.  Anxiolytic use and safety concerns - Uses Xanax  0.25 mg as needed, approximately every other day, but rarely takes it. - Concerned about potential need for increased Xanax  use in the future. - Inquires about safety of alternating Xanax  with a glass of wine on opposite days.  Adverse effects of prior antidepressants - Previously took Cymbalta   - Associates these medications with sweating and weight gain, which improved after discontinuation.  Resolved dermatologic symptoms - Prior abdominal rash resolved with prescribed powder.     ROS Per HPI      Objective:    BP 132/82 (BP Location: Right Arm, Patient Position: Sitting)   Pulse 78   Temp 98.6 F (37 C) (Temporal)   Ht 4' 11 (1.499 m)   Wt 140 lb (63.5 kg)   SpO2 95%   BMI 28.28 kg/m    Physical Exam Vitals and nursing note reviewed.  Constitutional:      General: She is not in acute distress.    Appearance: Normal appearance.  She is normal weight.  HENT:     Head: Normocephalic and atraumatic.     Right Ear: External ear normal.     Left Ear: External ear normal.     Nose: Nose normal.     Mouth/Throat:     Mouth: Mucous membranes are moist.     Pharynx: Oropharynx is clear.  Eyes:     Extraocular Movements: Extraocular movements intact.     Pupils: Pupils are equal, round, and reactive to light.  Neck:     Vascular: No carotid bruit.  Cardiovascular:     Rate and Rhythm: Normal rate and regular rhythm.     Pulses: Normal pulses.     Heart sounds: Normal heart sounds.  Pulmonary:     Effort: Pulmonary effort is normal. No respiratory distress.     Breath sounds: Normal breath sounds. No wheezing, rhonchi or rales.  Musculoskeletal:        General: Normal range of motion.     Cervical back: Normal range of motion.     Right lower leg: No edema.     Left lower leg: No edema.  Lymphadenopathy:     Cervical: No cervical adenopathy.  Neurological:     General: No focal deficit present.     Mental Status: She is alert and oriented to person, place, and time.  Psychiatric:        Mood and Affect: Mood normal.        Thought  Content: Thought content normal.     No results found for any visits on 07/10/24.  The 10-year ASCVD risk score (Arnett DK, et al., 2019) is: 10.2%  BP Readings from Last 3 Encounters:  07/10/24 132/82  04/03/24 120/70  03/27/24 126/78   Wt Readings from Last 3 Encounters:  07/10/24 140 lb (63.5 kg)  04/03/24 132 lb 3.2 oz (60 kg)  03/27/24 133 lb 3.2 oz (60.4 kg)      Last CBC Lab Results  Component Value Date   WBC 9.3 03/27/2024   HGB 13.7 03/27/2024   HCT 41.4 03/27/2024   MCV 91.6 03/27/2024   MCH 29.5 08/18/2023   RDW 13.3 03/27/2024   PLT 285.0 03/27/2024   Last metabolic panel Lab Results  Component Value Date   GLUCOSE 111 (H) 03/27/2024   NA 140 03/27/2024   K 3.9 03/27/2024   CL 103 03/27/2024   CO2 28 03/27/2024   BUN 9 03/27/2024    CREATININE 0.70 03/27/2024   GFR 87.11 03/27/2024   CALCIUM 8.9 03/27/2024   PROT 7.7 03/27/2024   ALBUMIN 4.3 03/27/2024   BILITOT 0.6 03/27/2024   ALKPHOS 82 03/27/2024   AST 21 03/27/2024   ALT 19 03/27/2024   ANIONGAP 8 05/21/2020   Last lipids Lab Results  Component Value Date   CHOL 191 03/27/2024   HDL 93.20 03/27/2024   LDLCALC 86 03/27/2024   TRIG 59.0 03/27/2024   CHOLHDL 2 03/27/2024   Last hemoglobin A1c Lab Results  Component Value Date   HGBA1C 6.0 03/27/2024   Last thyroid  functions Lab Results  Component Value Date   TSH 0.75 03/27/2024   T4TOTAL 7.3 03/27/2024   FREET4 0.82 10/27/2020   THYROIDAB 9 (H) 04/09/2016   Last vitamin D  Lab Results  Component Value Date   VD25OH 65.09 03/27/2024   Last vitamin B12 and Folate Lab Results  Component Value Date   VITAMINB12 765 09/03/2021         Assessment & Plan:   Assessment and Plan Assessment & Plan Major depressive disorder, recurrent, moderate, generalized anxiety disorder Experiencing situational stress due to holidays affecting mood. Tapering Lexapro  to 1.2 mL. Discussed Xanax  use for anxiety management, with a prescription allowing 0.5 mg every other day. Discussed potential impact of Lexapro  taper on thyroid  function and medication absorption. - Continue Lexapro  taper to 1.2 mL. - Consider alternating Xanax  0.25 mg with half a glass of wine for anxiety management, ensuring not to use both on the same day.  Hypothyroidism due to Hashimoto's thyroiditis Improvement with Armour Thyroid  noted.  - chronic med mgt Concerns about thyroid  function affecting medication absorption and overall health. Previous rash resolved. - Ordered thyroid  panel including T3. - Ordered CBC and metabolic panel. - Continue Armour Thyroid .  Medication Management - labs today, will dose adjust medications as needed Discussed dietary habits and challenges with gluten-free and dairy-free diet. Concerns about weight  gain during holidays. - Continue to monitor dietary habits and weight.     Orders Placed This Encounter  Procedures   Thyroid  Panel With TSH   T3, free   T3, reverse   CBC w/Diff   Comp Met (CMET)     No orders of the defined types were placed in this encounter.   Return in about 4 months (around 11/08/2024) for meds OV.  Corean LITTIE Ku, FNP

## 2024-07-10 NOTE — Patient Instructions (Signed)
 Continue current medication regimen.   We are checking labs today, will be in contact with any results that require further attention  Follow up with me in 4 months for medication follow up.

## 2024-07-12 ENCOUNTER — Ambulatory Visit: Payer: Self-pay | Admitting: Family Medicine

## 2024-07-14 LAB — THYROID PANEL WITH TSH
Free Thyroxine Index: 2 (ref 1.4–3.8)
T3 Uptake: 31 % (ref 22–35)
T4, Total: 6.6 ug/dL (ref 5.1–11.9)
TSH: 1.04 m[IU]/L (ref 0.40–4.50)

## 2024-07-14 LAB — T3, REVERSE: T3, Reverse: 12 ng/dL (ref 8–25)

## 2024-11-08 ENCOUNTER — Ambulatory Visit: Admitting: Family Medicine

## 2025-03-07 ENCOUNTER — Ambulatory Visit

## 2025-04-04 ENCOUNTER — Ambulatory Visit: Admitting: Internal Medicine
